# Patient Record
Sex: Male | Born: 1960 | Race: White | Hispanic: No | Marital: Single | Smoking: Former smoker
Health system: Southern US, Community
[De-identification: ages and names within clinical notes are randomized; demographics above are authoritative.]

## PROBLEM LIST (undated history)

## (undated) DIAGNOSIS — Z9221 Personal history of antineoplastic chemotherapy: Secondary | ICD-10-CM

## (undated) DIAGNOSIS — I209 Angina pectoris, unspecified: Secondary | ICD-10-CM

## (undated) DIAGNOSIS — I499 Cardiac arrhythmia, unspecified: Secondary | ICD-10-CM

## (undated) DIAGNOSIS — R911 Solitary pulmonary nodule: Secondary | ICD-10-CM

## (undated) DIAGNOSIS — C189 Malignant neoplasm of colon, unspecified: Secondary | ICD-10-CM

## (undated) DIAGNOSIS — M069 Rheumatoid arthritis, unspecified: Secondary | ICD-10-CM

## (undated) DIAGNOSIS — I341 Nonrheumatic mitral (valve) prolapse: Secondary | ICD-10-CM

## (undated) DIAGNOSIS — K649 Unspecified hemorrhoids: Secondary | ICD-10-CM

## (undated) DIAGNOSIS — R519 Headache, unspecified: Secondary | ICD-10-CM

## (undated) DIAGNOSIS — J449 Chronic obstructive pulmonary disease, unspecified: Secondary | ICD-10-CM

## (undated) HISTORY — DX: Malignant neoplasm of colon, unspecified: C18.9

## (undated) HISTORY — DX: Rheumatoid arthritis, unspecified: M06.9

## (undated) HISTORY — DX: Personal history of antineoplastic chemotherapy: Z92.21

## (undated) HISTORY — PX: COLONOSCOPY: SHX174

## (undated) HISTORY — PX: EYE SURGERY: SHX253

## (undated) HISTORY — DX: Nonrheumatic mitral (valve) prolapse: I34.1

## (undated) HISTORY — PX: COLON SURGERY: SHX602

## (undated) HISTORY — DX: Unspecified hemorrhoids: K64.9

## (undated) HISTORY — DX: Cardiac arrhythmia, unspecified: I49.9

---

## 2008-05-31 ENCOUNTER — Ambulatory Visit: Payer: Self-pay | Admitting: Cardiology

## 2008-05-31 ENCOUNTER — Ambulatory Visit: Payer: Self-pay | Admitting: Surgery

## 2008-06-04 ENCOUNTER — Inpatient Hospital Stay: Payer: Self-pay | Admitting: Surgery

## 2008-06-04 HISTORY — PX: ESOPHAGOGASTRODUODENOSCOPY: SHX1529

## 2008-06-26 ENCOUNTER — Ambulatory Visit: Payer: Self-pay | Admitting: Internal Medicine

## 2008-06-26 HISTORY — PX: PORTACATH PLACEMENT: SHX2246

## 2008-07-09 ENCOUNTER — Ambulatory Visit: Payer: Self-pay | Admitting: Internal Medicine

## 2008-07-17 ENCOUNTER — Ambulatory Visit: Payer: Self-pay | Admitting: Internal Medicine

## 2008-07-24 ENCOUNTER — Ambulatory Visit: Payer: Self-pay | Admitting: Surgery

## 2008-07-27 ENCOUNTER — Ambulatory Visit: Payer: Self-pay | Admitting: Internal Medicine

## 2008-07-27 HISTORY — PX: PORT-A-CATH REMOVAL: SHX5289

## 2008-08-27 ENCOUNTER — Ambulatory Visit: Payer: Self-pay | Admitting: Internal Medicine

## 2008-09-24 ENCOUNTER — Ambulatory Visit: Payer: Self-pay | Admitting: Internal Medicine

## 2008-10-25 ENCOUNTER — Ambulatory Visit: Payer: Self-pay | Admitting: Internal Medicine

## 2008-11-24 ENCOUNTER — Ambulatory Visit: Payer: Self-pay | Admitting: Internal Medicine

## 2008-12-25 ENCOUNTER — Ambulatory Visit: Payer: Self-pay | Admitting: Internal Medicine

## 2009-01-10 ENCOUNTER — Ambulatory Visit: Payer: Self-pay | Admitting: Gastroenterology

## 2009-01-24 ENCOUNTER — Ambulatory Visit: Payer: Self-pay | Admitting: Internal Medicine

## 2009-02-24 ENCOUNTER — Ambulatory Visit: Payer: Self-pay | Admitting: Internal Medicine

## 2009-03-27 ENCOUNTER — Ambulatory Visit: Payer: Self-pay | Admitting: Internal Medicine

## 2009-04-02 ENCOUNTER — Ambulatory Visit: Payer: Self-pay | Admitting: Internal Medicine

## 2009-04-26 ENCOUNTER — Ambulatory Visit: Payer: Self-pay | Admitting: Internal Medicine

## 2009-05-20 ENCOUNTER — Ambulatory Visit: Payer: Self-pay | Admitting: Dermatology

## 2009-05-27 ENCOUNTER — Ambulatory Visit: Payer: Self-pay | Admitting: Internal Medicine

## 2009-06-26 ENCOUNTER — Ambulatory Visit: Payer: Self-pay | Admitting: Internal Medicine

## 2009-07-01 ENCOUNTER — Ambulatory Visit: Payer: Self-pay | Admitting: Internal Medicine

## 2009-07-03 ENCOUNTER — Ambulatory Visit: Payer: Self-pay | Admitting: Unknown Physician Specialty

## 2009-07-27 ENCOUNTER — Ambulatory Visit: Payer: Self-pay | Admitting: Internal Medicine

## 2009-08-29 ENCOUNTER — Ambulatory Visit: Payer: Self-pay | Admitting: Cardiovascular Disease

## 2009-08-29 DIAGNOSIS — I059 Rheumatic mitral valve disease, unspecified: Secondary | ICD-10-CM | POA: Insufficient documentation

## 2009-08-29 DIAGNOSIS — R002 Palpitations: Secondary | ICD-10-CM | POA: Insufficient documentation

## 2009-08-29 DIAGNOSIS — R079 Chest pain, unspecified: Secondary | ICD-10-CM | POA: Insufficient documentation

## 2009-09-05 ENCOUNTER — Ambulatory Visit: Payer: Self-pay | Admitting: Cardiovascular Disease

## 2009-09-05 ENCOUNTER — Encounter: Payer: Self-pay | Admitting: Cardiovascular Disease

## 2009-09-05 ENCOUNTER — Ambulatory Visit: Payer: Self-pay

## 2009-09-16 LAB — CONVERTED CEMR LAB
ALT: 19 units/L (ref 0–53)
AST: 21 units/L (ref 0–37)
Albumin: 4.1 g/dL (ref 3.5–5.2)
Alkaline Phosphatase: 63 units/L (ref 39–117)
BUN: 13 mg/dL (ref 6–23)
CO2: 27 meq/L (ref 19–32)
Calcium: 8.8 mg/dL (ref 8.4–10.5)
Chloride: 102 meq/L (ref 96–112)
Cholesterol: 135 mg/dL (ref 0–200)
Creatinine, Ser: 0.92 mg/dL (ref 0.40–1.50)
Glucose, Bld: 76 mg/dL (ref 70–99)
HDL: 53 mg/dL (ref >39)
LDL Cholesterol: 71 mg/dL (ref 0–99)
Potassium: 4.2 meq/L (ref 3.5–5.3)
Sodium: 139 meq/L (ref 135–145)
TSH: 3.656 microintl units/mL (ref 0.350–4.500)
Total Bilirubin: 1 mg/dL (ref 0.3–1.2)
Total CHOL/HDL Ratio: 2.5
Total Protein: 6.7 g/dL (ref 6.0–8.3)
Triglycerides: 54 mg/dL (ref <150)
VLDL: 11 mg/dL (ref 0–40)

## 2009-09-24 ENCOUNTER — Ambulatory Visit: Payer: Self-pay | Admitting: Internal Medicine

## 2009-09-30 ENCOUNTER — Ambulatory Visit: Payer: Self-pay | Admitting: Internal Medicine

## 2009-10-25 ENCOUNTER — Ambulatory Visit: Payer: Self-pay | Admitting: Internal Medicine

## 2009-11-24 ENCOUNTER — Ambulatory Visit: Payer: Self-pay | Admitting: Internal Medicine

## 2009-12-30 ENCOUNTER — Ambulatory Visit: Payer: Self-pay | Admitting: Internal Medicine

## 2010-01-24 ENCOUNTER — Ambulatory Visit: Payer: Self-pay | Admitting: Internal Medicine

## 2010-02-24 ENCOUNTER — Ambulatory Visit: Payer: Self-pay | Admitting: Internal Medicine

## 2010-03-27 ENCOUNTER — Ambulatory Visit: Payer: Self-pay | Admitting: Internal Medicine

## 2010-04-07 ENCOUNTER — Ambulatory Visit: Payer: Self-pay | Admitting: Internal Medicine

## 2010-04-09 LAB — CEA: CEA: 1.3 ng/mL (ref 0.0–4.7)

## 2010-04-26 ENCOUNTER — Ambulatory Visit: Payer: Self-pay | Admitting: Internal Medicine

## 2010-05-27 ENCOUNTER — Ambulatory Visit: Payer: Self-pay | Admitting: Internal Medicine

## 2010-06-30 ENCOUNTER — Ambulatory Visit: Payer: Self-pay | Admitting: Internal Medicine

## 2010-07-24 ENCOUNTER — Ambulatory Visit: Payer: Self-pay | Admitting: Unknown Physician Specialty

## 2010-07-25 LAB — PATHOLOGY REPORT

## 2010-07-27 ENCOUNTER — Ambulatory Visit: Payer: Self-pay | Admitting: Internal Medicine

## 2010-08-13 LAB — CEA: CEA: 1.1 ng/mL (ref 0.0–4.7)

## 2010-08-26 NOTE — Assessment & Plan Note (Signed)
Summary: np6   Visit Type:  New Patient Referring Provider:  self ref Primary Provider:  Joylene Draft, MD  CC:  chest pains, irregular heart beat, no sob, and no edema.  History of Present Illness: Benjamin Clarke is a very pleasant 50 year old male with a history of colon cancer, status post resection of 9 inches of his colon 14 months ago, history of chemotherapy over course of 32 weeks, 12 cycles, noted to have one positive node who presents to the cardiology clinic 2 stent which care with a history of recent episodes of chest discomfort and palpitations.  Benjamin Clarke states that over the past several weeks to months he's had episodes of chest pain. Initial episode was in August 2010 when he had chest pain near his catheter site or his chemotherapy was injected. Since then he has had chest pain on the right side, down his arms bilaterally at various times. Usually happens at rest and not with exertion as he is able to exercise on a treadmill on a bike with no problems. He denies any significant shortness of breath. He describes the pain as fleeting in nature sometimes lasting for several seconds, sometimes up to a minute or 2. He has also had an episode of dizziness when driving his car from [place] to [place] area. This lasted approximately 20 minutes and resolved without any intervention. He states that he does have a history of mitral valve prolapse this was diagnosed many years ago he has not had any further workup.  Preventive Screening-Counseling & Management  Alcohol-Tobacco     Alcohol drinks/day: <1     Smoking Status: quit     Year Quit: 2008  Caffeine-Diet-Exercise     Caffeine use/day: 2-3 cups     Does Patient Exercise: yes     Type of exercise: walk      Drug Use:  yes.    Current Problems (verified): 1)  Mitral Valve Prolapse  (ICD-424.0)  Current Medications (verified): 1)  Daily Multi  Tabs (Multiple Vitamins-Minerals) .Marland Kitchen.. 1 By Mouth Once Daily 2)  Fish Oil  1000 Mg Caps (Omega-3 Fatty Acids) .... 2 By Mouth Once Daily 3)  Vitamin D 1000 Unit Tabs (Cholecalciferol) .Marland Kitchen.. 1 By Mouth Every Other Day 4)  Aspirin 81 Mg Tbec (Aspirin) .... Take One Tablet By Mouth Daily Maybe Not Everyday  Allergies (verified): No Known Drug Allergies  Past History:  Past Medical History: hx colon cancer mitral valve prolapse  Past Surgical History: colon tumor removed  Family History: Father: deceased 58: lung and heart problems, murmur Mother: living 43: healthy 1st cousin heart and valve problems (paternal) 2nd cousin passed from a heart problem  Social History: Retired  Single  Tobacco Use - Former.  Alcohol Use - yes 1 a day Regular Exercise - yes Drug Use - yes - 1989 last usedAlcohol drinks/day:  <1 Smoking Status:  quit Caffeine use/day:  2-3 cups Does Patient Exercise:  yes Drug Use:  yes  Review of Systems  The patient denies anorexia, fever, weight loss, weight gain, vision loss, decreased hearing, hoarseness, chest pain, syncope, dyspnea on exertion, peripheral edema, prolonged cough, headaches, hemoptysis, abdominal pain, melena, hematochezia, severe indigestion/heartburn, hematuria, incontinence, genital sores, muscle weakness, suspicious skin lesions, transient blindness, difficulty walking, depression, unusual weight change, abnormal bleeding, enlarged lymph nodes, angioedema, breast masses, and testicular masses.         + palitations,   Vital Signs:  Patient profile:   50 year old male Height:  73 inches Weight:      177 pounds BMI:     23.44 Pulse rate:   54 / minute Pulse rhythm:   regular BP sitting:   104 / 66  (left arm) Cuff size:   regular  Vitals Entered By: Mercer Pod (August 29, 2009 9:46 AM)  Physical Exam  General:  thin gentleman in no apparent distress. No shortness of breath. His HEENT exam is benign, oropharynx is clear, neck is supple with no JVP or carotid bruits. Heart sounds are regular  with normal S1 and S2 and a 1-2/6 systolic ejection murmur heard at the left sternal border to the axilla. Lungs are clear to auscultation with no wheezes or rales, abdominal exam is benign with no widened aortic pulsation, no significant lower extremity edema, neurological exam is grossly nonfocal, skin is warm and dry, pulses are equal and simethicone in his upper and lower extremities.   New Orders:     1)  Echocardiogram (Echo)   Impression & Recommendations:  Problem # 1:  PALPITATIONS (ICD-785.1) Benjamin Clarke has had periods of palpitations but he states that these are rare, brief period I suspect that he has some ectopy, either APCs her VPCs. In the setting of normal LV systolic function, this should not be a large concern. Given his recent episodes of chest pain and palpitations, we have ordered a echocardiogram. This will also evaluate his  mitral valve prolapse. His updated medication list for this problem includes:    Aspirin 81 Mg Tbec (Aspirin) .Marland Kitchen... Take one tablet by mouth daily maybe not everyday  Orders: Echocardiogram (Echo)  Problem # 2:  PERSONAL HISTORY OF ANTINEOPLASTIC CHEMOTHERAPY (ICD-V87.41) history of colon cancer and chemotherapy as above. He is uncertain if his pulse cycles of chemotherapy has affected his cardiac function. There is nothing on clinical exam to suggest heart failure though the echocardiogram will confirm normal cardiac function. Orders: Echocardiogram (Echo)  Problem # 3:  CHEST PAIN-UNSPECIFIED (ICD-786.50) the episodes of chest pain but Benjamin Clarke has had over the past 6 months is less concerning given that it is atypical in nature, brief, associated with rest and not with exertion. He does have a reasonable exercise tolerance. He does have risk factors including smoking but he stopped 2 years ago. We will try to obtain a recent lipid panel and have given him a lab form this we sent back for our review. We have suggested that he start an aspirin  81 mg daily. His updated medication list for this problem includes:    Aspirin 81 Mg Tbec (Aspirin) .Marland Kitchen... Take one tablet by mouth daily maybe not everyday  Patient Instructions: 1)  Your physician recommends that you schedule a follow-up appointment in: 1 YR 2)  Your physician recommends that you return for a FASTING lipid profile: at echo appt. (lipids, cmet, tsh) 3)  Your physician recommends that you continue on your current medications as directed. Please refer to the Current Medication list given to you today. 4)  Your physician has requested that you have an echocardiogram.  Echocardiography is a painless test that uses sound waves to create images of your heart. It provides your doctor with information about the size and shape of your heart and how well your heart's chambers and valves are working.  This procedure takes approximately one hour. There are no restrictions for this procedure.

## 2010-08-26 NOTE — Progress Notes (Signed)
Summary: PHI  PHI   Imported By: Harlon Flor 08/30/2009 09:54:26  _____________________________________________________________________  External Attachment:    Type:   Image     Comment:   External Document

## 2010-08-27 ENCOUNTER — Ambulatory Visit: Payer: Self-pay | Admitting: Internal Medicine

## 2010-09-08 ENCOUNTER — Ambulatory Visit: Payer: Self-pay | Admitting: Surgery

## 2011-02-11 ENCOUNTER — Encounter: Payer: Self-pay | Admitting: Cardiovascular Disease

## 2011-03-02 ENCOUNTER — Ambulatory Visit: Payer: Self-pay | Admitting: Internal Medicine

## 2011-03-03 LAB — CEA: CEA: 1.5 ng/mL (ref 0.0–4.7)

## 2011-03-28 ENCOUNTER — Ambulatory Visit: Payer: Self-pay | Admitting: Internal Medicine

## 2011-04-08 ENCOUNTER — Ambulatory Visit: Payer: Self-pay | Admitting: Internal Medicine

## 2011-04-27 ENCOUNTER — Ambulatory Visit: Payer: Self-pay | Admitting: Internal Medicine

## 2011-06-25 ENCOUNTER — Ambulatory Visit: Payer: Self-pay | Admitting: Internal Medicine

## 2011-11-12 ENCOUNTER — Ambulatory Visit: Payer: Self-pay | Admitting: Internal Medicine

## 2011-11-12 LAB — COMPREHENSIVE METABOLIC PANEL
Albumin: 3.8 g/dL (ref 3.4–5.0)
Alkaline Phosphatase: 76 U/L (ref 50–136)
Anion Gap: 7 (ref 7–16)
BUN: 13 mg/dL (ref 7–18)
Bilirubin,Total: 0.7 mg/dL (ref 0.2–1.0)
Calcium, Total: 9.6 mg/dL (ref 8.5–10.1)
Chloride: 98 mmol/L (ref 98–107)
Co2: 30 mmol/L (ref 21–32)
Creatinine: 1.06 mg/dL (ref 0.60–1.30)
EGFR (African American): >60
EGFR (Non-African Amer.): >60
Glucose: 97 mg/dL (ref 65–99)
Osmolality: 270 (ref 275–301)
Potassium: 4 mmol/L (ref 3.5–5.1)
SGOT(AST): 18 U/L (ref 15–37)
SGPT (ALT): 19 U/L
Sodium: 135 mmol/L — ABNORMAL LOW (ref 136–145)
Total Protein: 8.3 g/dL — ABNORMAL HIGH (ref 6.4–8.2)

## 2011-11-12 LAB — CBC CANCER CENTER
Basophil #: 0 x10 3/mm (ref 0.0–0.1)
Basophil %: 0.4 %
Eosinophil #: 0.2 x10 3/mm (ref 0.0–0.7)
Eosinophil %: 2.7 %
HCT: 45.2 % (ref 40.0–52.0)
HGB: 15.4 g/dL (ref 13.0–18.0)
Lymphocyte #: 1.7 x10 3/mm (ref 1.0–3.6)
Lymphocyte %: 20.8 %
MCH: 30.3 pg (ref 26.0–34.0)
MCHC: 34.1 g/dL (ref 32.0–36.0)
MCV: 89 fL (ref 80–100)
Monocyte #: 0.7 x10 3/mm (ref 0.2–1.0)
Monocyte %: 8.8 %
Neutrophil #: 5.7 x10 3/mm (ref 1.4–6.5)
Neutrophil %: 67.3 %
Platelet: 211 x10 3/mm (ref 150–440)
RBC: 5.08 10*6/uL (ref 4.40–5.90)
RDW: 13 % (ref 11.5–14.5)
WBC: 8.4 x10 3/mm (ref 3.8–10.6)

## 2011-11-13 LAB — CEA: CEA: 1.3 ng/mL (ref 0.0–4.7)

## 2011-11-25 ENCOUNTER — Ambulatory Visit: Payer: Self-pay | Admitting: Internal Medicine

## 2012-04-20 ENCOUNTER — Ambulatory Visit: Payer: Self-pay | Admitting: Internal Medicine

## 2012-04-20 LAB — HEPATIC FUNCTION PANEL A (ARMC)
Albumin: 3.6 g/dL (ref 3.4–5.0)
Alkaline Phosphatase: 75 U/L (ref 50–136)
Bilirubin, Direct: 0.1 mg/dL (ref 0.00–0.20)
Bilirubin,Total: 0.6 mg/dL (ref 0.2–1.0)
SGOT(AST): 20 U/L (ref 15–37)
SGPT (ALT): 23 U/L (ref 12–78)
Total Protein: 7.8 g/dL (ref 6.4–8.2)

## 2012-04-20 LAB — CBC CANCER CENTER
Basophil #: 0.1 x10 3/mm (ref 0.0–0.1)
Basophil %: 0.7 %
Eosinophil #: 0.3 x10 3/mm (ref 0.0–0.7)
Eosinophil %: 3.2 %
HCT: 45.5 % (ref 40.0–52.0)
HGB: 15.8 g/dL (ref 13.0–18.0)
Lymphocyte #: 1.7 x10 3/mm (ref 1.0–3.6)
Lymphocyte %: 18.6 %
MCH: 30.5 pg (ref 26.0–34.0)
MCHC: 34.7 g/dL (ref 32.0–36.0)
MCV: 88 fL (ref 80–100)
Monocyte #: 0.9 x10 3/mm (ref 0.2–1.0)
Monocyte %: 10.3 %
Neutrophil #: 6 x10 3/mm (ref 1.4–6.5)
Neutrophil %: 67.2 %
Platelet: 219 x10 3/mm (ref 150–440)
RBC: 5.18 10*6/uL (ref 4.40–5.90)
RDW: 13.4 % (ref 11.5–14.5)
WBC: 8.9 x10 3/mm (ref 3.8–10.6)

## 2012-04-20 LAB — CREATININE, SERUM
Creatinine: 0.94 mg/dL (ref 0.60–1.30)
EGFR (African American): >60
EGFR (Non-African Amer.): >60

## 2012-04-21 LAB — CEA: CEA: 1 ng/mL (ref 0.0–4.7)

## 2012-04-26 ENCOUNTER — Ambulatory Visit: Payer: Self-pay | Admitting: Internal Medicine

## 2012-05-27 ENCOUNTER — Ambulatory Visit: Payer: Self-pay | Admitting: Internal Medicine

## 2012-10-27 ENCOUNTER — Ambulatory Visit: Payer: Self-pay | Admitting: Internal Medicine

## 2012-10-27 LAB — CREATININE, SERUM
Creatinine: 1.18 mg/dL (ref 0.60–1.30)
EGFR (African American): >60
EGFR (Non-African Amer.): >60

## 2012-10-28 LAB — CEA: CEA: 1.4 ng/mL (ref 0.0–4.7)

## 2012-11-24 ENCOUNTER — Ambulatory Visit: Payer: Self-pay | Admitting: Internal Medicine

## 2013-05-04 ENCOUNTER — Ambulatory Visit: Payer: Self-pay | Admitting: Internal Medicine

## 2013-05-04 LAB — CREATININE, SERUM
Creatinine: 1.06 mg/dL (ref 0.60–1.30)
EGFR (African American): >60
EGFR (Non-African Amer.): >60

## 2013-05-04 LAB — CBC CANCER CENTER
Basophil #: 0 x10 3/mm (ref 0.0–0.1)
Basophil %: 0.4 %
Eosinophil #: 0.2 x10 3/mm (ref 0.0–0.7)
Eosinophil %: 2.9 %
HCT: 44.8 % (ref 40.0–52.0)
HGB: 15.1 g/dL (ref 13.0–18.0)
Lymphocyte #: 1.3 x10 3/mm (ref 1.0–3.6)
Lymphocyte %: 17 %
MCH: 29.7 pg (ref 26.0–34.0)
MCHC: 33.8 g/dL (ref 32.0–36.0)
MCV: 88 fL (ref 80–100)
Monocyte #: 0.8 x10 3/mm (ref 0.2–1.0)
Monocyte %: 9.6 %
Neutrophil #: 5.5 x10 3/mm (ref 1.4–6.5)
Neutrophil %: 70.1 %
Platelet: 212 x10 3/mm (ref 150–440)
RBC: 5.1 10*6/uL (ref 4.40–5.90)
RDW: 13.2 % (ref 11.5–14.5)
WBC: 7.9 x10 3/mm (ref 3.8–10.6)

## 2013-05-04 LAB — HEPATIC FUNCTION PANEL A (ARMC)
Albumin: 3.3 g/dL — ABNORMAL LOW (ref 3.4–5.0)
Alkaline Phosphatase: 62 U/L (ref 50–136)
Bilirubin, Direct: 0.1 mg/dL (ref 0.00–0.20)
Bilirubin,Total: 0.6 mg/dL (ref 0.2–1.0)
SGOT(AST): 19 U/L (ref 15–37)
SGPT (ALT): 25 U/L (ref 12–78)
Total Protein: 7.3 g/dL (ref 6.4–8.2)

## 2013-05-05 LAB — CEA: CEA: 1 ng/mL (ref 0.0–4.7)

## 2013-05-27 ENCOUNTER — Ambulatory Visit: Payer: Self-pay | Admitting: Internal Medicine

## 2013-06-26 ENCOUNTER — Ambulatory Visit: Payer: Self-pay | Admitting: Internal Medicine

## 2014-04-06 ENCOUNTER — Ambulatory Visit: Payer: Self-pay | Admitting: Internal Medicine

## 2014-05-03 ENCOUNTER — Ambulatory Visit: Payer: Self-pay | Admitting: Internal Medicine

## 2014-05-03 LAB — COMPREHENSIVE METABOLIC PANEL
Albumin: 3.6 g/dL (ref 3.4–5.0)
Alkaline Phosphatase: 52 U/L
Anion Gap: 5 — ABNORMAL LOW (ref 7–16)
BUN: 15 mg/dL (ref 7–18)
Bilirubin,Total: 0.7 mg/dL (ref 0.2–1.0)
Calcium, Total: 9.2 mg/dL (ref 8.5–10.1)
Chloride: 103 mmol/L (ref 98–107)
Co2: 30 mmol/L (ref 21–32)
Creatinine: 1.01 mg/dL (ref 0.60–1.30)
EGFR (African American): >60
EGFR (Non-African Amer.): >60
Glucose: 102 mg/dL — ABNORMAL HIGH (ref 65–99)
Osmolality: 277 (ref 275–301)
Potassium: 3.8 mmol/L (ref 3.5–5.1)
SGOT(AST): 19 U/L (ref 15–37)
SGPT (ALT): 20 U/L
Sodium: 138 mmol/L (ref 136–145)
Total Protein: 7.5 g/dL (ref 6.4–8.2)

## 2014-05-03 LAB — CBC CANCER CENTER
Basophil #: 0.1 x10 3/mm (ref 0.0–0.1)
Basophil %: 1 %
Eosinophil #: 0.2 x10 3/mm (ref 0.0–0.7)
Eosinophil %: 2.6 %
HCT: 44.3 % (ref 40.0–52.0)
HGB: 15.1 g/dL (ref 13.0–18.0)
Lymphocyte #: 1.6 x10 3/mm (ref 1.0–3.6)
Lymphocyte %: 19.6 %
MCH: 30.3 pg (ref 26.0–34.0)
MCHC: 34 g/dL (ref 32.0–36.0)
MCV: 89 fL (ref 80–100)
Monocyte #: 0.7 x10 3/mm (ref 0.2–1.0)
Monocyte %: 9.3 %
Neutrophil #: 5.3 x10 3/mm (ref 1.4–6.5)
Neutrophil %: 67.5 %
Platelet: 222 x10 3/mm (ref 150–440)
RBC: 4.99 10*6/uL (ref 4.40–5.90)
RDW: 12.8 % (ref 11.5–14.5)
WBC: 7.9 x10 3/mm (ref 3.8–10.6)

## 2014-05-03 LAB — BILIRUBIN, DIRECT: Bilirubin, Direct: 0.1 mg/dL (ref 0.00–0.20)

## 2014-05-04 LAB — CEA: CEA: 1.4 ng/mL (ref 0.0–4.7)

## 2014-05-27 ENCOUNTER — Ambulatory Visit: Payer: Self-pay | Admitting: Internal Medicine

## 2014-11-30 DIAGNOSIS — H4040X0 Glaucoma secondary to eye inflammation, unspecified eye, stage unspecified: Secondary | ICD-10-CM | POA: Insufficient documentation

## 2015-02-06 DIAGNOSIS — H15032 Posterior scleritis, left eye: Secondary | ICD-10-CM | POA: Insufficient documentation

## 2015-02-06 DIAGNOSIS — M069 Rheumatoid arthritis, unspecified: Secondary | ICD-10-CM | POA: Insufficient documentation

## 2015-02-06 DIAGNOSIS — H43823 Vitreomacular adhesion, bilateral: Secondary | ICD-10-CM | POA: Insufficient documentation

## 2015-05-06 ENCOUNTER — Other Ambulatory Visit: Payer: Self-pay | Admitting: *Deleted

## 2015-05-06 ENCOUNTER — Encounter: Payer: Self-pay | Admitting: *Deleted

## 2015-05-06 DIAGNOSIS — C187 Malignant neoplasm of sigmoid colon: Secondary | ICD-10-CM

## 2015-05-07 ENCOUNTER — Inpatient Hospital Stay: Payer: BLUE CROSS/BLUE SHIELD | Attending: Internal Medicine

## 2015-05-07 ENCOUNTER — Ambulatory Visit (HOSPITAL_BASED_OUTPATIENT_CLINIC_OR_DEPARTMENT_OTHER): Payer: BLUE CROSS/BLUE SHIELD | Admitting: Internal Medicine

## 2015-05-07 ENCOUNTER — Encounter: Payer: Self-pay | Admitting: Internal Medicine

## 2015-05-07 VITALS — BP 94/60 | HR 52 | Temp 97.8°F | Resp 18 | Ht 73.0 in | Wt 168.7 lb

## 2015-05-07 DIAGNOSIS — Z9221 Personal history of antineoplastic chemotherapy: Secondary | ICD-10-CM

## 2015-05-07 DIAGNOSIS — Z79899 Other long term (current) drug therapy: Secondary | ICD-10-CM | POA: Insufficient documentation

## 2015-05-07 DIAGNOSIS — M069 Rheumatoid arthritis, unspecified: Secondary | ICD-10-CM

## 2015-05-07 DIAGNOSIS — I341 Nonrheumatic mitral (valve) prolapse: Secondary | ICD-10-CM | POA: Diagnosis not present

## 2015-05-07 DIAGNOSIS — Z87891 Personal history of nicotine dependence: Secondary | ICD-10-CM | POA: Insufficient documentation

## 2015-05-07 DIAGNOSIS — Z85038 Personal history of other malignant neoplasm of large intestine: Secondary | ICD-10-CM | POA: Insufficient documentation

## 2015-05-07 DIAGNOSIS — C187 Malignant neoplasm of sigmoid colon: Secondary | ICD-10-CM

## 2015-05-07 DIAGNOSIS — Z7952 Long term (current) use of systemic steroids: Secondary | ICD-10-CM

## 2015-05-07 DIAGNOSIS — C189 Malignant neoplasm of colon, unspecified: Secondary | ICD-10-CM

## 2015-05-07 LAB — HEPATIC FUNCTION PANEL
ALT: 16 U/L — ABNORMAL LOW (ref 17–63)
AST: 20 U/L (ref 15–41)
Albumin: 4.1 g/dL (ref 3.5–5.0)
Alkaline Phosphatase: 45 U/L (ref 38–126)
Bilirubin, Direct: 0.2 mg/dL (ref 0.1–0.5)
Indirect Bilirubin: 0.6 mg/dL (ref 0.3–0.9)
Total Bilirubin: 0.8 mg/dL (ref 0.3–1.2)
Total Protein: 7 g/dL (ref 6.5–8.1)

## 2015-05-07 LAB — CBC WITH DIFFERENTIAL/PLATELET
Basophils Absolute: 0.1 10*3/uL (ref 0–0.1)
Basophils Relative: 1 %
Eosinophils Absolute: 0.2 10*3/uL (ref 0–0.7)
Eosinophils Relative: 2 %
HCT: 45 % (ref 40.0–52.0)
Hemoglobin: 15.2 g/dL (ref 13.0–18.0)
Lymphocytes Relative: 17 %
Lymphs Abs: 1.6 10*3/uL (ref 1.0–3.6)
MCH: 30.3 pg (ref 26.0–34.0)
MCHC: 33.8 g/dL (ref 32.0–36.0)
MCV: 89.4 fL (ref 80.0–100.0)
Monocytes Absolute: 1 10*3/uL (ref 0.2–1.0)
Monocytes Relative: 10 %
Neutro Abs: 6.9 10*3/uL — ABNORMAL HIGH (ref 1.4–6.5)
Neutrophils Relative %: 70 %
Platelets: 224 10*3/uL (ref 150–440)
RBC: 5.04 MIL/uL (ref 4.40–5.90)
RDW: 13.3 % (ref 11.5–14.5)
WBC: 9.7 10*3/uL (ref 3.8–10.6)

## 2015-05-07 LAB — CREATININE, SERUM
Creatinine, Ser: 0.89 mg/dL (ref 0.61–1.24)
GFR calc Af Amer: >60 mL/min (ref >60)
GFR calc non Af Amer: >60 mL/min (ref >60)

## 2015-05-07 NOTE — Progress Notes (Signed)
[facility] OFFICE PROGRESS NOTE  No care team member to display   SUMMARY OF ONCOLOGIC HISTORY:  # 2009- COLON CA STAGE III [T3N1 s/p Left hemi-colec [[1/13LN]]; MSI- H ? S/p FOLFOX x12 [finished Aug 2010] last colo [dec 2011]  # Uveitis [on pred]/ RA  # Lung nodule [incidental on CT]; last Nov 2014- 20m RML- STABLE [no further fu]   INTERVAL HISTORY:  A very pleasant 54year old male patient with above history of stage III colon cancer is here for follow-up. Patient denies any blood in stools or black stools cough or shortness of breath or chest pain. Appetite is good.  Patient is awaiting a colonoscopy in November 2016.   REVIEW OF SYSTEMS:  A complete 10 point review of system is done which is negative except mentioned above/history of present illness.   PAST MEDICAL HISTORY :  Past Medical History  Diagnosis Date  . Colon cancer ([facility]     sigmoid  . Mitral valve prolapse   . History of chemotherapy     FOLFOX  . Rheumatoid arthritis ([facility]   . Hemorrhoid   . Abnormal heart rhythm     PAST SURGICAL HISTORY :   Past Surgical History  Procedure Laterality Date  . Colon surgery    . Colonoscopy  2013, 05/16/2008  . Esophagogastroduodenoscopy  06/04/08  . Portacath placement  06/2008  . Port-a-cath removal  2010    FAMILY HISTORY :   Family History  Problem Relation Age of Onset  . Heart murmur Father   . Colon cancer Maternal Grandfather   . Arthritis Mother   . Rheumatologic disease Maternal Aunt     SOCIAL HISTORY:   Social History  Substance Use Topics  . Smoking status: Former Smoker -- 1.00 packs/day for 24 years    Types: Cigarettes    Start date: 04/15/2008  . Smokeless tobacco: Never Used  . Alcohol Use: 0.0 oz/week    0 Standard drinks or equivalent per week     Comment: occasional alcohol use    ALLERGIES:  is allergic to thorazine.  MEDICATIONS:  Current Outpatient Prescriptions  Medication Sig Dispense Refill  .  cholecalciferol (VITAMIN D) 1000 UNITS tablet Take 1,000 Units by mouth every other day.      . Difluprednate 0.05 % EMUL Apply 1 drop to eye daily.    . dorzolamide-timolol (COSOPT) 22.3-6.8 MG/ML ophthalmic solution Place 1 drop into the left eye 2 (two) times daily.  5  . Multiple Vitamins-Minerals (DAILY MULTI) TABS Take by mouth daily.      . prednisoLONE acetate (PRED FORTE) 1 % ophthalmic suspension Place 1 drop into the left eye daily. X 14 days  0  . predniSONE (DELTASONE) 10 MG tablet Take 10 mg by mouth daily.  4  . travoprost, benzalkonium, (TRAVATAN) 0.004 % ophthalmic solution Apply 1 drop to eye at bedtime.     No current facility-administered medications for this visit.    PHYSICAL EXAMINATION: ECOG PERFORMANCE STATUS: 0 - Asymptomatic  BP 94/60 mmHg  Pulse 52  Temp(Src) 97.8 F (36.6 C) (Tympanic)  Resp 18  Ht '6\' 1"'  (1.854 m)  Wt 168 lb 10.4 oz (76.499 kg)  BMI 22.26 kg/m2  Filed Weights   05/07/15 1056  Weight: 168 lb 10.4 oz (76.499 kg)    GENERAL: Well-nourished well-developed; Alert, no distress and comfortable.   Alone EYES: no pallor or icterus OROPHARYNX: no thrush or ulceration; good dentition  NECK: supple, no masses felt LYMPH:  no palpable lymphadenopathy in the cervical, axillary or inguinal regions LUNGS: clear to auscultation and  No wheeze or crackles HEART/CVS: regular rate & rhythm and no murmurs; No lower extremity edema ABDOMEN:abdomen soft, non-tender and normal bowel sounds Musculoskeletal:no cyanosis of digits and no clubbing  PSYCH: alert & oriented x 3 with fluent speech NEURO: no focal motor/sensory deficits SKIN:  no rashes or significant lesions  LABORATORY DATA:  I have reviewed the data as listed    Component Value Date/Time   NA 138 05/03/2014 1041   NA 139 09/05/2009 2110   K 3.8 05/03/2014 1041   K 4.2 09/05/2009 2110   CL 103 05/03/2014 1041   CL 102 09/05/2009 2110   CO2 30 05/03/2014 1041   CO2 27 09/05/2009 2110    GLUCOSE 102* 05/03/2014 1041   GLUCOSE 76 09/05/2009 2110   BUN 15 05/03/2014 1041   BUN 13 09/05/2009 2110   CREATININE 0.89 05/07/2015 1019   CREATININE 1.01 05/03/2014 1041   CALCIUM 9.2 05/03/2014 1041   CALCIUM 8.8 09/05/2009 2110   PROT 7.0 05/07/2015 1019   PROT 7.5 05/03/2014 1041   ALBUMIN 4.1 05/07/2015 1019   ALBUMIN 3.6 05/03/2014 1041   AST 20 05/07/2015 1019   AST 19 05/03/2014 1041   ALT 16* 05/07/2015 1019   ALT 20 05/03/2014 1041   ALKPHOS 45 05/07/2015 1019   ALKPHOS 52 05/03/2014 1041   BILITOT 0.8 05/07/2015 1019   BILITOT 0.7 05/03/2014 1041   GFRNONAA >60 05/07/2015 1019   GFRNONAA >60 05/03/2014 1041   GFRNONAA >60 05/04/2013 0952   GFRAA >60 05/07/2015 1019   GFRAA >60 05/03/2014 1041   GFRAA >60 05/04/2013 0952    No results found for: SPEP, UPEP  Lab Results  Component Value Date   WBC 9.7 05/07/2015   NEUTROABS 6.9* 05/07/2015   HGB 15.2 05/07/2015   HCT 45.0 05/07/2015   MCV 89.4 05/07/2015   PLT 224 05/07/2015      Chemistry      Component Value Date/Time   NA 138 05/03/2014 1041   NA 139 09/05/2009 2110   K 3.8 05/03/2014 1041   K 4.2 09/05/2009 2110   CL 103 05/03/2014 1041   CL 102 09/05/2009 2110   CO2 30 05/03/2014 1041   CO2 27 09/05/2009 2110   BUN 15 05/03/2014 1041   BUN 13 09/05/2009 2110   CREATININE 0.89 05/07/2015 1019   CREATININE 1.01 05/03/2014 1041      Component Value Date/Time   CALCIUM 9.2 05/03/2014 1041   CALCIUM 8.8 09/05/2009 2110   ALKPHOS 45 05/07/2015 1019   ALKPHOS 52 05/03/2014 1041   AST 20 05/07/2015 1019   AST 19 05/03/2014 1041   ALT 16* 05/07/2015 1019   ALT 20 05/03/2014 1041   BILITOT 0.8 05/07/2015 1019   BILITOT 0.7 05/03/2014 1041       RADIOGRAPHIC STUDIES: I have personally reviewed the radiological images as listed and agreed with the findings in the report. No results found.   ASSESSMENT & PLAN:  # COLON CA STAGE III [2009] s/p FOLFOX; clinically no evidence of  recurrence noted. CEA from 2015 within normal limits. I agree with colonoscopy surveillance as plan to GI. CEA today pending; if abnormal patient will need imaging.  # Recommend take calcium and vitamin D especially with chronic prednisone intake.   Follow-up in 1 year.    All questions were answered. The patient knows to call the clinic with any problems, questions  or concerns. No barriers to learning was detected. I spent 15 minutes counseling the patient face to face. The total time spent in the appointment was 30 minutes and more than 50% was on counseling and review of test results     Cammie Sickle, MD 05/07/2015 11:21 AM

## 2015-05-08 ENCOUNTER — Telehealth: Payer: Self-pay

## 2015-05-08 LAB — CEA: CEA: 1.5 ng/mL (ref 0.0–4.7)

## 2015-05-08 NOTE — Telephone Encounter (Signed)
Called patient to give him his CEA results.  His CEA was 1.5 and this is in the normal range. Explained the normal range for nonsmokers is <3.9 and for smokers is <5.6. Patient stated understanding and thanked me for calling him with the results.

## 2015-12-06 ENCOUNTER — Telehealth: Payer: Self-pay | Admitting: *Deleted

## 2015-12-06 NOTE — Telephone Encounter (Signed)
OK to take MTX per Dr B. Pt  informed

## 2015-12-06 NOTE — Telephone Encounter (Addendum)
Pt is on yesrly fu for colon cancer and he is asking if it is ok to take Methotrexate ordered by his Rheumatology doctor. Please advise. His next fu is in Oct 2017

## 2016-05-05 ENCOUNTER — Inpatient Hospital Stay: Payer: BLUE CROSS/BLUE SHIELD | Attending: Internal Medicine

## 2016-05-05 ENCOUNTER — Encounter: Payer: Self-pay | Admitting: Internal Medicine

## 2016-05-05 ENCOUNTER — Inpatient Hospital Stay (HOSPITAL_BASED_OUTPATIENT_CLINIC_OR_DEPARTMENT_OTHER): Payer: BLUE CROSS/BLUE SHIELD | Admitting: Internal Medicine

## 2016-05-05 DIAGNOSIS — Z85038 Personal history of other malignant neoplasm of large intestine: Secondary | ICD-10-CM | POA: Insufficient documentation

## 2016-05-05 DIAGNOSIS — I499 Cardiac arrhythmia, unspecified: Secondary | ICD-10-CM | POA: Insufficient documentation

## 2016-05-05 DIAGNOSIS — R918 Other nonspecific abnormal finding of lung field: Secondary | ICD-10-CM

## 2016-05-05 DIAGNOSIS — Z87891 Personal history of nicotine dependence: Secondary | ICD-10-CM

## 2016-05-05 DIAGNOSIS — Z9221 Personal history of antineoplastic chemotherapy: Secondary | ICD-10-CM

## 2016-05-05 DIAGNOSIS — C186 Malignant neoplasm of descending colon: Secondary | ICD-10-CM | POA: Insufficient documentation

## 2016-05-05 DIAGNOSIS — M069 Rheumatoid arthritis, unspecified: Secondary | ICD-10-CM | POA: Diagnosis not present

## 2016-05-05 DIAGNOSIS — Z79899 Other long term (current) drug therapy: Secondary | ICD-10-CM | POA: Diagnosis not present

## 2016-05-05 DIAGNOSIS — K649 Unspecified hemorrhoids: Secondary | ICD-10-CM

## 2016-05-05 DIAGNOSIS — C189 Malignant neoplasm of colon, unspecified: Secondary | ICD-10-CM

## 2016-05-05 DIAGNOSIS — I341 Nonrheumatic mitral (valve) prolapse: Secondary | ICD-10-CM | POA: Diagnosis not present

## 2016-05-05 LAB — COMPREHENSIVE METABOLIC PANEL
ALT: 17 U/L (ref 17–63)
AST: 18 U/L (ref 15–41)
Albumin: 4.1 g/dL (ref 3.5–5.0)
Alkaline Phosphatase: 44 U/L (ref 38–126)
Anion gap: 8 (ref 5–15)
BUN: 17 mg/dL (ref 6–20)
CO2: 22 mmol/L (ref 22–32)
Calcium: 8.7 mg/dL — ABNORMAL LOW (ref 8.9–10.3)
Chloride: 107 mmol/L (ref 101–111)
Creatinine, Ser: 1.07 mg/dL (ref 0.61–1.24)
GFR calc Af Amer: >60 mL/min (ref >60)
GFR calc non Af Amer: >60 mL/min (ref >60)
Glucose, Bld: 104 mg/dL — ABNORMAL HIGH (ref 65–99)
Potassium: 3.3 mmol/L — ABNORMAL LOW (ref 3.5–5.1)
Sodium: 137 mmol/L (ref 135–145)
Total Bilirubin: 0.6 mg/dL (ref 0.3–1.2)
Total Protein: 6.9 g/dL (ref 6.5–8.1)

## 2016-05-05 LAB — CBC WITH DIFFERENTIAL/PLATELET
Basophils Absolute: 0.1 10*3/uL (ref 0–0.1)
Basophils Relative: 1 %
Eosinophils Absolute: 0.2 10*3/uL (ref 0–0.7)
Eosinophils Relative: 2 %
HCT: 45.2 % (ref 40.0–52.0)
Hemoglobin: 15.4 g/dL (ref 13.0–18.0)
Lymphocytes Relative: 24 %
Lymphs Abs: 2.1 10*3/uL (ref 1.0–3.6)
MCH: 31.5 pg (ref 26.0–34.0)
MCHC: 34 g/dL (ref 32.0–36.0)
MCV: 92.6 fL (ref 80.0–100.0)
Monocytes Absolute: 1 10*3/uL (ref 0.2–1.0)
Monocytes Relative: 11 %
Neutro Abs: 5.6 10*3/uL (ref 1.4–6.5)
Neutrophils Relative %: 62 %
Platelets: 227 10*3/uL (ref 150–440)
RBC: 4.88 MIL/uL (ref 4.40–5.90)
RDW: 14 % (ref 11.5–14.5)
WBC: 9.1 10*3/uL (ref 3.8–10.6)

## 2016-05-05 NOTE — Assessment & Plan Note (Addendum)
#   COLON CA STAGE III [2009] s/p FOLFOX; clinically no evidence of recurrence noted. CEA 2017- N.   # take calcium + vit D/ recommend baby asprin/day.   Follow-up in 1 year/labs cbc/cmp/cea.

## 2016-05-05 NOTE — Progress Notes (Signed)
[facility] OFFICE PROGRESS NOTE  No care team member to display   SUMMARY OF ONCOLOGIC HISTORY: Oncology History   # 2009- COLON CA STAGE III [T3N1 s/p Left hemi-colec [[1/13LN]]; MSI- H ? 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 On Mxt [summer 2017]  # Lung nodule [incidental on CT]; last Nov 2014- 66m RML- STABLE [no further fu]     [place] ([facility]   05/07/2015 Initial Diagnosis    Colon carcinoma ([facility])      [facility] [facility] ([facility]   05/05/2016 Initial Diagnosis    [facility] [place] [facility] [place]        INTERVAL HISTORY:  A very pleasant 55year old male patient with above history of stage III colon cancer is here for follow-up.   He was started on methotrexate for uveitis/ rheumatoid arthritis.   Patient denies any blood in stools or black stools cough or shortness of breath or chest pain. Appetite is good.  Patient had colonoscopy in November 2016- 2 polyps taken out.  REVIEW OF SYSTEMS:  A complete 10 point review of system is done which is negative except mentioned above/history of present illness.   PAST MEDICAL HISTORY :  Past Medical History:  Diagnosis Date  . Abnormal heart rhythm   . Colon cancer ([facility]    sigmoid  . Hemorrhoid   . History of chemotherapy    FOLFOX  . Mitral valve prolapse   . Rheumatoid arthritis ([facility]     PAST SURGICAL HISTORY :   Past Surgical History:  Procedure Laterality Date  . COLON SURGERY    . COLONOSCOPY  2013, 05/16/2008  . ESOPHAGOGASTRODUODENOSCOPY  06/04/08  . PORT-A-CATH REMOVAL  2010  . PORTACATH PLACEMENT  06/2008    FAMILY HISTORY :   Family History  Problem Relation Age of Onset  . Arthritis Mother   . Heart murmur Father   . Colon cancer Maternal Grandfather   . Rheumatologic disease Maternal Aunt     SOCIAL HISTORY:   Social History  Substance Use Topics  . Smoking status: Former Smoker    Packs/day: 1.00   Years: 24.00    Types: Cigarettes    Start date: 04/15/2008  . Smokeless tobacco: Never Used  . Alcohol use 0.0 oz/week     Comment: occasional alcohol use    ALLERGIES:  is allergic to thorazine [chlorpromazine].  MEDICATIONS:  Current Outpatient Prescriptions  Medication Sig Dispense Refill  . acetaZOLAMIDE (DIAMOX) 500 MG capsule Take 500 mg by mouth 2 (two) times daily.  3  . brimonidine (ALPHAGAN P) 0.1 % SOLN Apply to eye.    . calcium carbonate (OS-CAL - DOSED IN MG OF ELEMENTAL CALCIUM) 1250 (500 Ca) MG tablet Take by mouth.    . cholecalciferol (VITAMIN D) 1000 UNITS tablet Take 1,000 Units by mouth every other day.      . Difluprednate 0.05 % EMUL Apply 1 drop to eye daily.    . dorzolamide-timolol (COSOPT) 22.3-6.8 MG/ML ophthalmic solution Apply to eye.    . folic acid (FOLVITE) 1 MG tablet TAKE 1 TABLET (1 MG TOTAL) BY MOUTH ONCE DAILY.  11  . latanoprost (XALATAN) 0.005 % ophthalmic solution INSTILL ONE DROP INTO THE LEFT EYE EACH NIGHT AT BEDTIME  5  . methotrexate (RHEUMATREX) 2.5 MG tablet TAKE 8 TABLETS (20 MG TOTAL) BY MOUTH EVERY 7 (SEVEN) DAYS. 8 PILLS ONCE A WEEK WITH A MEAL  3  .  Multiple Vitamins-Minerals (DAILY MULTI) TABS Take by mouth daily.      . prednisoLONE acetate (PRED FORTE) 1 % ophthalmic suspension Place 1 drop into the left eye daily. X 14 days  0  . predniSONE (DELTASONE) 1 MG tablet 25m a day x 2 weeks, 825ma day x 2 weeks, 51m15m day x 2 weeks, 6mg42mday x 2 weeks, 5mg 6may and stay    . predniSONE (DELTASONE) 10 MG tablet Take 10 mg by mouth daily.  4  . travoprost, benzalkonium, (TRAVATAN) 0.004 % ophthalmic solution Apply 1 drop to eye at bedtime.     No current facility-administered medications for this visit.     PHYSICAL EXAMINATION: ECOG PERFORMANCE STATUS: 0 - Asymptomatic  BP 103/65 (BP Location: Right Arm, Patient Position: Sitting)   Pulse (!) 57   Temp 98.3 F (36.8 C)   Resp 19   Ht _0  (1.854 m)   Wt 161 lb 6 oz (73.2 kg)    BMI 21.29 kg/m   Filed Weights   05/05/16 1044  Weight: 161 lb 6 oz (73.2 kg)    GENERAL: Well-nourished well-developed; Alert, no distress and comfortable.   Alone EYES: no pallor or icterus OROPHARYNX: no thrush or ulceration; good dentition  NECK: supple, no masses felt LYMPH:  no palpable lymphadenopathy in the cervical, axillary or inguinal regions LUNGS: clear to auscultation and  No wheeze or crackles HEART/CVS: regular rate & rhythm and no murmurs; No lower extremity edema ABDOMEN:abdomen soft, non-tender and normal bowel sounds Musculoskeletal:no cyanosis of digits and no clubbing  PSYCH: alert & oriented x 3 with fluent speech NEURO: no focal motor/sensory deficits SKIN:  no rashes or significant lesions  LABORATORY DATA:  I have reviewed the data as listed    Component Value Date/Time   NA 137 05/05/2016 1007   NA 138 05/03/2014 1041   K 3.3 (L) 05/05/2016 1007   K 3.8 05/03/2014 1041   CL 107 05/05/2016 1007   CL 103 05/03/2014 1041   CO2 22 05/05/2016 1007   CO2 30 05/03/2014 1041   GLUCOSE 104 (H) 05/05/2016 1007   GLUCOSE 102 (H) 05/03/2014 1041   BUN 17 05/05/2016 1007   BUN 15 05/03/2014 1041   CREATININE 1.07 05/05/2016 1007   CREATININE 1.01 05/03/2014 1041   CALCIUM 8.7 (L) 05/05/2016 1007   CALCIUM 9.2 05/03/2014 1041   PROT 6.9 05/05/2016 1007   PROT 7.5 05/03/2014 1041   ALBUMIN 4.1 05/05/2016 1007   ALBUMIN 3.6 05/03/2014 1041   AST 18 05/05/2016 1007   AST 19 05/03/2014 1041   ALT 17 05/05/2016 1007   ALT 20 05/03/2014 1041   ALKPHOS 44 05/05/2016 1007   ALKPHOS 52 05/03/2014 1041   BILITOT 0.6 05/05/2016 1007   BILITOT 0.7 05/03/2014 1041   GFRNONAA >60 05/05/2016 1007   GFRNONAA >60 05/03/2014 1041   GFRNONAA >60 05/04/2013 0952   GFRAA >60 05/05/2016 1007   GFRAA >60 05/03/2014 1041   GFRAA >60 05/04/2013 0952    No results found for: SPEP, UPEP  Lab Results  Component Value Date   WBC 9.1 05/05/2016   NEUTROABS 5.6  05/05/2016   HGB 15.4 05/05/2016   HCT 45.2 05/05/2016   MCV 92.6 05/05/2016   PLT 227 05/05/2016      Chemistry      Component Value Date/Time   NA 137 05/05/2016 1007   NA 138 05/03/2014 1041   K 3.3 (L) 05/05/2016 1007  K 3.8 05/03/2014 1041   CL 107 05/05/2016 1007   CL 103 05/03/2014 1041   CO2 22 05/05/2016 1007   CO2 30 05/03/2014 1041   BUN 17 05/05/2016 1007   BUN 15 05/03/2014 1041   CREATININE 1.07 05/05/2016 1007   CREATININE 1.01 05/03/2014 1041      Component Value Date/Time   CALCIUM 8.7 (L) 05/05/2016 1007   CALCIUM 9.2 05/03/2014 1041   ALKPHOS 44 05/05/2016 1007   ALKPHOS 52 05/03/2014 1041   AST 18 05/05/2016 1007   AST 19 05/03/2014 1041   ALT 17 05/05/2016 1007   ALT 20 05/03/2014 1041   BILITOT 0.6 05/05/2016 1007   BILITOT 0.7 05/03/2014 1041       RADIOGRAPHIC STUDIES: I have personally reviewed the radiological images as listed and agreed with the findings in the report. No results found.   ASSESSMENT & PLAN:  Cancer of descending colon ([place])  # COLON CA STAGE III [2009] s/p FOLFOX; clinically no evidence of recurrence noted. CEA 2017- N.   # take calcium + vit D/ recommend baby asprin/day.   Follow-up in 1 year/labs cbc/cmp/cea.     Cammie Sickle, MD 05/05/2016 12:19 PM

## 2016-05-05 NOTE — Progress Notes (Signed)
Pt reports no major concerns.  Last week had some nausea but unsure if related to methotrexate

## 2016-05-06 LAB — CEA: CEA: 1.9 ng/mL (ref 0.0–4.7)

## 2016-06-03 LAB — HM HEPATITIS C SCREENING LAB: HM Hepatitis Screen: NEGATIVE

## 2016-08-05 ENCOUNTER — Telehealth: Payer: Self-pay | Admitting: Cardiovascular Disease

## 2016-08-05 NOTE — Telephone Encounter (Signed)
Symptoms sound somewhat atypical Sounds like median or radial nerve issue Would chat with primary care physician Happy to see him for further evaluation if needed

## 2016-08-05 NOTE — Telephone Encounter (Signed)
Spoke with patient and he reports having pain that starts in his ring and middle finger then radiates up right arm into chest. He states that this happens multiple times lasting only a few seconds. He denies any shortness of breath, difficulty breathing, or chest pain on the left side, or left arm. Let him know that this was not typical cardiac presentation but that it could be something else which should be assessed by possibly his primary care physician. Instructed him to call back if he has any chest pain or shortness of breath and if it does not go away then report to emergency room. He is scheduled to come in and see Dr. Rockey Situ on 08/18/16 at 3:20PM and let him know that I would share this with Dr. Rockey Situ as well to see if he has any further recommendations. He verbalized understanding of our conversation and had no further questions at this time.

## 2016-08-05 NOTE — Telephone Encounter (Signed)
Pt  stating he's seen Dr Rockey Situ in past He is calling stating somtimes he has a shooting pain in hand up right arm into chest Happend a few times This has been going on for a few months. Just started to pick up lately  Pt is coming to see Dr Rockey Situ on 08/18/16 3:20 pm  Please advise

## 2016-08-05 NOTE — Telephone Encounter (Signed)
Reviewed recommendations with patient to all his primary care physician and confirmed his upcoming appointment. He verbalized understanding of instructions to contact them and he had no further questions at this time.

## 2016-08-18 ENCOUNTER — Ambulatory Visit: Payer: BLUE CROSS/BLUE SHIELD | Admitting: Cardiovascular Disease

## 2016-09-09 DIAGNOSIS — H2513 Age-related nuclear cataract, bilateral: Secondary | ICD-10-CM | POA: Insufficient documentation

## 2016-10-07 ENCOUNTER — Encounter: Payer: Self-pay | Admitting: *Deleted

## 2016-10-08 NOTE — Discharge Instructions (Signed)

## 2016-10-12 ENCOUNTER — Ambulatory Visit: Payer: BLUE CROSS/BLUE SHIELD | Admitting: Anesthesiology

## 2016-10-12 ENCOUNTER — Ambulatory Visit
Admission: RE | Admit: 2016-10-12 | Discharge: 2016-10-12 | Disposition: A | Discharge status: Home / Self Care | Payer: BLUE CROSS/BLUE SHIELD | Source: Ambulatory Visit | Attending: Ophthalmology | Admitting: Ophthalmology

## 2016-10-12 ENCOUNTER — Encounter
Admission: RE | Disposition: A | Discharge status: Home / Self Care | Payer: Self-pay | Source: Ambulatory Visit | Attending: Ophthalmology

## 2016-10-12 DIAGNOSIS — H10402 Unspecified chronic conjunctivitis, left eye: Secondary | ICD-10-CM | POA: Diagnosis not present

## 2016-10-12 DIAGNOSIS — Z7952 Long term (current) use of systemic steroids: Secondary | ICD-10-CM | POA: Diagnosis not present

## 2016-10-12 DIAGNOSIS — Z87891 Personal history of nicotine dependence: Secondary | ICD-10-CM | POA: Insufficient documentation

## 2016-10-12 DIAGNOSIS — Z85038 Personal history of other malignant neoplasm of large intestine: Secondary | ICD-10-CM | POA: Diagnosis not present

## 2016-10-12 DIAGNOSIS — H4042X3 Glaucoma secondary to eye inflammation, left eye, severe stage: Secondary | ICD-10-CM | POA: Insufficient documentation

## 2016-10-12 DIAGNOSIS — M069 Rheumatoid arthritis, unspecified: Secondary | ICD-10-CM | POA: Insufficient documentation

## 2016-10-12 DIAGNOSIS — J449 Chronic obstructive pulmonary disease, unspecified: Secondary | ICD-10-CM | POA: Diagnosis not present

## 2016-10-12 DIAGNOSIS — H4089 Other specified glaucoma: Secondary | ICD-10-CM | POA: Diagnosis present

## 2016-10-12 DIAGNOSIS — Z79899 Other long term (current) drug therapy: Secondary | ICD-10-CM | POA: Diagnosis not present

## 2016-10-12 HISTORY — PX: AQUEOUS SHUNT: SHX6305

## 2016-10-12 HISTORY — PX: BIOPSY: SHX5522

## 2016-10-12 SURGERY — INSERTION, AQUEOUS SHUNT, EYE
Anesthesia: Monitor Anesthesia Care | Site: Eye | Laterality: Left | Wound class: Clean

## 2016-10-12 MED ORDER — FENTANYL CITRATE (PF) 100 MCG/2ML IJ SOLN: 25.0000 ug | INTRAMUSCULAR | Status: DC | PRN

## 2016-10-12 MED ORDER — ACETAMINOPHEN 325 MG PO TABS: 650.0000 mg | ORAL_TABLET | Freq: Four times a day (QID) | ORAL | Status: DC | PRN

## 2016-10-12 MED ORDER — FENTANYL CITRATE (PF) 100 MCG/2ML IJ SOLN
INTRAMUSCULAR | Status: DC | PRN
  Administered 2016-10-12 (×2): 50 ug via INTRAVENOUS

## 2016-10-12 MED ORDER — MOXIFLOXACIN HCL 0.5 % OP SOLN
1.0000 [drp] | OPHTHALMIC | Status: DC | PRN
  Administered 2016-10-12 (×3): 1 [drp] via OPHTHALMIC

## 2016-10-12 MED ORDER — MIDAZOLAM HCL 2 MG/2ML IJ SOLN
INTRAMUSCULAR | Status: DC | PRN
  Administered 2016-10-12: 2 mg via INTRAVENOUS

## 2016-10-12 MED ORDER — LIDOCAINE HCL (PF) 4 % IJ SOLN
INTRAMUSCULAR | Status: DC | PRN
  Administered 2016-10-12: 2 mL via OPHTHALMIC

## 2016-10-12 MED ORDER — NA HYALUR & NA CHOND-NA HYALUR 0.55-0.5 ML IO KIT
PACK | INTRAOCULAR | Status: DC | PRN
  Administered 2016-10-12: .5 mL via OPHTHALMIC

## 2016-10-12 MED ORDER — TETRACAINE HCL 0.5 % OP SOLN
OPHTHALMIC | Status: DC | PRN
  Administered 2016-10-12: 2 [drp] via OPHTHALMIC

## 2016-10-12 MED ORDER — OXYCODONE HCL 5 MG PO TABS: 5.0000 mg | ORAL_TABLET | Freq: Once | ORAL | Status: DC | PRN

## 2016-10-12 MED ORDER — MOXIFLOXACIN HCL 0.5 % OP SOLN
OPHTHALMIC | Status: DC | PRN
  Administered 2016-10-12: 1 [drp] via OPHTHALMIC

## 2016-10-12 MED ORDER — OXYCODONE HCL 5 MG/5ML PO SOLN: 5.0000 mg | Freq: Once | ORAL | Status: DC | PRN

## 2016-10-12 SURGICAL SUPPLY — 38 items
ALLOGRAFT TUTOPLST SCER0.5X1.0 (Tissue) ×1 IMPLANT
APPLICATOR COTTON TIP 3IN (MISCELLANEOUS) ×2 IMPLANT
Ahmed Glaucoma Valve, Model FP7 (Shunt) ×2 IMPLANT
BANDAGE EYE OVAL (MISCELLANEOUS) ×4 IMPLANT
BLADE SCLEROTME MULTI-SIDE (MISCELLANEOUS) IMPLANT
CANNULA ANT/CHMB 27GA (MISCELLANEOUS) ×4 IMPLANT
CORD BIP STRL DISP 12FT (MISCELLANEOUS) ×2 IMPLANT
CUP MEDICINE 2OZ PLAST GRAD ST (MISCELLANEOUS) ×2 IMPLANT
ERASER TAPRD BLUNT STR 20-23GA (MISCELLANEOUS) ×1 IMPLANT
GLOVE BIO SURGEON STRL SZ7 (GLOVE) ×2 IMPLANT
GLOVE SURG LX 6.5 MICRO (GLOVE) ×1
GLOVE SURG LX STRL 6.5 MICRO (GLOVE) ×1 IMPLANT
GOWN STRL REUS W/ TWL LRG LVL3 (GOWN DISPOSABLE) ×2 IMPLANT
GOWN STRL REUS W/TWL LRG LVL3 (GOWN DISPOSABLE) ×2
KNIFE OPTIMUM SIDEPORT 15DEG (MISCELLANEOUS) IMPLANT
KNIFE SIDECUT EYE (MISCELLANEOUS) ×2 IMPLANT
MARKER SKIN DUAL TIP RULER LAB (MISCELLANEOUS) ×2 IMPLANT
NDL SAFETY 22GX1.5 (NEEDLE) ×2 IMPLANT
NEEDLE FILTER BLUNT 18X 1/2SAF (NEEDLE) ×2
NEEDLE FILTER BLUNT 18X1 1/2 (NEEDLE) ×2 IMPLANT
NEEDLE HYPO 30X.5 LL (NEEDLE) IMPLANT
PACK EYE AFTER SURG (MISCELLANEOUS) ×2 IMPLANT
PACK OPTHALMIC (MISCELLANEOUS) ×2 IMPLANT
PROTECTOR LASIK FLAP (MISCELLANEOUS) ×2 IMPLANT
SOL BAL SALT 15ML (MISCELLANEOUS) ×4
SOLUTION BAL SALT 15ML (MISCELLANEOUS) ×2 IMPLANT
SPONGE SURG I SPEAR (MISCELLANEOUS) ×6 IMPLANT
SUT ETHILON 10-0 CS-B-6CS-B-6 (SUTURE)
SUT ETHILON 8 0 TG100 8 (SUTURE) ×2 IMPLANT
SUT VICRYL 7 0 TG140 8 (SUTURE) ×2 IMPLANT
SUT VICRYL 8 0 BV 130 5 (SUTURE) ×2 IMPLANT
SUTURE EHLN 10-0 CS-B-6CS-B-6 (SUTURE) IMPLANT
SYR 3ML LL SCALE MARK (SYRINGE) ×6 IMPLANT
SYRINGE 10CC LL (SYRINGE) ×2 IMPLANT
TAPERED BLUNT TIP STR 20-23GA (MISCELLANEOUS) ×2
TUTOPLAST SCIERA 0.5X1.0 (Tissue) ×2 IMPLANT
WATER STERILE IRR 250ML POUR (IV SOLUTION) ×2 IMPLANT
WIPE NON LINTING 3.25X3.25 (MISCELLANEOUS) ×2 IMPLANT

## 2016-10-12 NOTE — H&P (Signed)
H+P reviewed and is up to date, please see paper chart.  

## 2016-10-12 NOTE — Anesthesia Preprocedure Evaluation (Signed)
Anesthesia Evaluation  Patient identified by MRN, date of birth, ID band  Reviewed: Allergy & Precautions, NPO status , Patient's Chart, lab work & pertinent test results  Airway Mallampati: I  TM Distance: >3 FB Neck ROM: Full    Dental no notable dental hx.    Pulmonary former smoker,    Pulmonary exam normal        Cardiovascular Normal cardiovascular exam     Neuro/Psych negative neurological ROS  negative psych ROS   GI/Hepatic Neg liver ROS, H./O colon cancer   Endo/Other  negative endocrine ROS  Renal/GU negative Renal ROS     Musculoskeletal  (+) Arthritis , Rheumatoid disorders,    Abdominal   Peds  Hematology negative hematology ROS (+)   Anesthesia Other Findings   Reproductive/Obstetrics                             Anesthesia Physical Anesthesia Plan  ASA: II  Anesthesia Plan: MAC   Post-op Pain Management:    Induction: Intravenous  Airway Management Planned:   Additional Equipment:   Intra-op Plan:   Post-operative Plan:   Informed Consent: I have reviewed the patients History and Physical, chart, labs and discussed the procedure including the risks, benefits and alternatives for the proposed anesthesia with the patient or authorized representative who has indicated his/her understanding and acceptance.     Plan Discussed with: CRNA  Anesthesia Plan Comments:         Anesthesia Quick Evaluation

## 2016-10-12 NOTE — Anesthesia Postprocedure Evaluation (Signed)
Anesthesia Post Note  Patient: Benjamin Clarke  Procedure(s) Performed: Procedure(s) (LRB): AQUEOUS SHUNT with scleral patch graft  Left (Left) BIOPSY (fresh & permanent specimens sent from same location)- conjunctiva left eye (Left)  Patient location during evaluation: PACU Anesthesia Type: MAC Level of consciousness: awake and alert and oriented Pain management: pain level controlled Vital Signs Assessment: post-procedure vital signs reviewed and stable Respiratory status: spontaneous breathing and nonlabored ventilation Cardiovascular status: stable Postop Assessment: no signs of nausea or vomiting and adequate PO intake Anesthetic complications: no    Estill Batten

## 2016-10-12 NOTE — Transfer of Care (Signed)
Immediate Anesthesia Transfer of Care Note  Patient: Benjamin Clarke  Procedure(s) Performed: Procedure(s) with comments: AQUEOUS SHUNT with scleral patch graft  Left (Left) - tube shunt BIOPSY (fresh & permanent specimens sent from same location)- conjunctiva left eye (Left)  Patient Location: PACU  Anesthesia Type: MAC  Level of Consciousness: awake, alert  and patient cooperative  Airway and Oxygen Therapy: Patient Spontanous Breathing and Patient connected to supplemental oxygen  Post-op Assessment: Post-op Vital signs reviewed, Patient's Cardiovascular Status Stable, Respiratory Function Stable, Patent Airway and No signs of Nausea or vomiting  Post-op Vital Signs: Reviewed and stable  Complications: No apparent anesthesia complications

## 2016-10-12 NOTE — Anesthesia Procedure Notes (Signed)
Procedure Name: MAC Performed by: Mayme Genta Pre-anesthesia Checklist: Patient identified, Emergency Drugs available, Suction available, Timeout performed and Patient being monitored Patient Re-evaluated:Patient Re-evaluated prior to inductionOxygen Delivery Method: Nasal cannula Placement Confirmation: positive ETCO2

## 2016-10-13 ENCOUNTER — Encounter: Payer: Self-pay | Admitting: Ophthalmology

## 2016-10-13 NOTE — Op Note (Signed)
Date of Surgery: 10/13/16  PREOPERATIVE DIAGNOSES: 1. Severe stage uveitic glaucoma, left eye. 2. Conjunctival lesion, left eye.  POSTOPERATIVE DIAGNOSES: 1-2. Same  PROCEDURE PERFORMED: 1. Ahmed drainage device placement, left eye. 2. Coverage of glaucoma drainage device with Tutoplast sclera, left eye. 3. Excisional biopsy of conjunctival lesion, left eye.  SURGEON: Almon Hercules, MD.    ANESTHESIA: Monitored anesthesia care and subconjunctival/subTenon's.  IMPLANTS:   Implant Name Type Inv. Item Serial No. Manufacturer Lot No. LRB No. Used  TUTOPLAST SCIERA 0.5X1.0 - [id number] Tissue TUTOPLAST SCIERA 0.5X1.0 [id number] RIT  Left 1  Ahmed Glaucoma Valve, Model FP7 Shunt   [id number] [employer] [id number] Left 1    COMPLICATIONS: None.  DESCRIPTION OF PROCEDURE: After informed consent was obtained, the patient was brought to the operating room and placed in the supine position.  The patient was then prepped and draped in the usual sterile fashion for intraocular surgery on the right eye.  A wire lid speculum was placed.  A 7-0 vicryl suture was placed through the superotemporal limbal cornea and the eye was rotated to expose the superotemporal quadrant.  Using Westcott scissors, a small incision through conjunctiva and Tenons was made in the superotemporal quadrant approximately 4 mm posterior to the limbus. A block which consisted of 2 mL of 50% of 4% Xylocaine without epinephrine and 50% of 0.75% Marcaine was given at sub-Tenons level. Tenons were then dissected from sclera posteriorly and anteriorly with blunt Westcott dissection. Hemostasis was achieved with cautery.  An Ahmed drainage device, model FP7, was removed from its packaging, inspected, and found to be in good condition.  Balanced salt solution on a cannula was used to irrigate the tube, and free flow was noted above the plate. The implant was placed in the retrobulbar space between the superior and lateral rectus muscles  and two 8-0 nylon sutures were placed through the eyelets of the implant. A 22-gauge needle was used to enter the anterior chamber 3 mm from the superior limbus supero-temporally.  The tube was trimmed to length and placed through the needle tract and set to rest above the iris with the aid of Provisc in the anterior chamber with no corneal touch noted.  The tube was then approximated to the globe using a single 8-0 nylon figure-of- eight suture.  Donor scleral overlay placed over the tube and sewn in place using 1 interrupted 7-0 vicryl suture.  The conjunctival incision was closed with running 8-0 Vicryl sutures.  Provisc was flushed from the anterior chamber.  At the end of the case, the corneal limbal traction suture was removed as was the wire lid speculum.  Attention was then directed towards the inferotemporal conjunctival which was elevated and flesh colored.  A small subconjunctival injection of lidocaine/Marcaine was given in that area and a roughly 82mm wide ellipsoid of conjunctival tissue was excised.  This tissue was cut in half, with half going to pathology for fresh sections and the other half was sent to pathology in a formalin cup.  A running 8-O vicryl suture was used to close the resulting conjunctival defect.  The eye was dressed with an application of atropine ointment, and a Fox shield was placed.  The patient was brought to the recovery area having tolerated the procedure with no complications.

## 2016-10-14 LAB — SURGICAL PATHOLOGY

## 2017-03-24 ENCOUNTER — Telehealth: Payer: Self-pay | Admitting: Internal Medicine

## 2017-03-24 DIAGNOSIS — C8599 Non-Hodgkin lymphoma, unspecified, extranodal and solid organ sites: Secondary | ICD-10-CM

## 2017-03-24 DIAGNOSIS — C186 Malignant neoplasm of descending colon: Secondary | ICD-10-CM

## 2017-03-24 NOTE — Telephone Encounter (Signed)
Procedure performed call from Dr. 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   # Recommend CT of the chest abdomen and pelvis with contrast; also MRI brain with and without contrast ASAP. Orders have been placed. [Reports to be faxed to [facility]- fax- [phone].  # Please have patient follow-up with me labs-CBC CMP and LDH CEA; one to 2 days post above imaging.   # H/B- please inform patient of above recommendations/my discussion with [facility] Dr.

## 2017-03-25 NOTE — Telephone Encounter (Signed)
Patient's mother notified of Dr. Aletha Halim comments - she verbalized understanding.  Message sent to scheduling team to schedule MRI, CT, and follow up lab & MD appts.

## 2017-04-02 ENCOUNTER — Ambulatory Visit
Admission: RE | Admit: 2017-04-02 | Discharge: 2017-04-02 | Disposition: A | Discharge status: Home / Self Care | Payer: BLUE CROSS/BLUE SHIELD | Source: Ambulatory Visit | Attending: Internal Medicine | Admitting: Internal Medicine

## 2017-04-02 ENCOUNTER — Other Ambulatory Visit: Payer: BLUE CROSS/BLUE SHIELD

## 2017-04-02 DIAGNOSIS — C186 Malignant neoplasm of descending colon: Secondary | ICD-10-CM | POA: Insufficient documentation

## 2017-04-02 DIAGNOSIS — C8599 Non-Hodgkin lymphoma, unspecified, extranodal and solid organ sites: Secondary | ICD-10-CM | POA: Diagnosis not present

## 2017-04-02 DIAGNOSIS — N289 Disorder of kidney and ureter, unspecified: Secondary | ICD-10-CM | POA: Diagnosis not present

## 2017-04-02 MED ORDER — IOPAMIDOL (ISOVUE-300) INJECTION 61%
125.0000 mL | Freq: Once | INTRAVENOUS | Status: AC | PRN
  Administered 2017-04-02: 125 mL via INTRAVENOUS

## 2017-04-05 ENCOUNTER — Ambulatory Visit
Admission: RE | Admit: 2017-04-05 | Discharge: 2017-04-05 | Disposition: A | Discharge status: Home / Self Care | Payer: BLUE CROSS/BLUE SHIELD | Source: Ambulatory Visit | Attending: Internal Medicine | Admitting: Internal Medicine

## 2017-04-05 ENCOUNTER — Other Ambulatory Visit: Payer: Self-pay | Admitting: Internal Medicine

## 2017-04-05 DIAGNOSIS — C8599 Non-Hodgkin lymphoma, unspecified, extranodal and solid organ sites: Secondary | ICD-10-CM | POA: Insufficient documentation

## 2017-04-05 DIAGNOSIS — C186 Malignant neoplasm of descending colon: Secondary | ICD-10-CM

## 2017-04-05 MED ORDER — GADOBENATE DIMEGLUMINE 529 MG/ML IV SOLN
15.0000 mL | Freq: Once | INTRAVENOUS | Status: AC | PRN
  Administered 2017-04-05: 15 mL via INTRAVENOUS

## 2017-04-06 ENCOUNTER — Inpatient Hospital Stay: Payer: BLUE CROSS/BLUE SHIELD

## 2017-04-06 ENCOUNTER — Ambulatory Visit: Payer: BLUE CROSS/BLUE SHIELD | Admitting: Internal Medicine

## 2017-04-06 ENCOUNTER — Inpatient Hospital Stay: Payer: BLUE CROSS/BLUE SHIELD | Attending: Internal Medicine | Admitting: Internal Medicine

## 2017-04-06 DIAGNOSIS — Z79899 Other long term (current) drug therapy: Secondary | ICD-10-CM | POA: Diagnosis not present

## 2017-04-06 DIAGNOSIS — H209 Unspecified iridocyclitis: Secondary | ICD-10-CM

## 2017-04-06 DIAGNOSIS — C8599 Non-Hodgkin lymphoma, unspecified, extranodal and solid organ sites: Secondary | ICD-10-CM

## 2017-04-06 DIAGNOSIS — H547 Unspecified visual loss: Secondary | ICD-10-CM

## 2017-04-06 DIAGNOSIS — Z800 Family history of malignant neoplasm of digestive organs: Secondary | ICD-10-CM | POA: Insufficient documentation

## 2017-04-06 DIAGNOSIS — C884 Extranodal marginal zone B-cell lymphoma of mucosa-associated lymphoid tissue [MALT-lymphoma]: Secondary | ICD-10-CM | POA: Insufficient documentation

## 2017-04-06 DIAGNOSIS — Z85038 Personal history of other malignant neoplasm of large intestine: Secondary | ICD-10-CM | POA: Insufficient documentation

## 2017-04-06 DIAGNOSIS — Z87891 Personal history of nicotine dependence: Secondary | ICD-10-CM

## 2017-04-06 DIAGNOSIS — C186 Malignant neoplasm of descending colon: Secondary | ICD-10-CM

## 2017-04-06 DIAGNOSIS — N289 Disorder of kidney and ureter, unspecified: Secondary | ICD-10-CM | POA: Diagnosis not present

## 2017-04-06 DIAGNOSIS — I341 Nonrheumatic mitral (valve) prolapse: Secondary | ICD-10-CM | POA: Diagnosis not present

## 2017-04-06 DIAGNOSIS — M069 Rheumatoid arthritis, unspecified: Secondary | ICD-10-CM | POA: Diagnosis not present

## 2017-04-06 DIAGNOSIS — R911 Solitary pulmonary nodule: Secondary | ICD-10-CM

## 2017-04-06 LAB — COMPREHENSIVE METABOLIC PANEL
ALT: 16 U/L — ABNORMAL LOW (ref 17–63)
AST: 24 U/L (ref 15–41)
Albumin: 4.2 g/dL (ref 3.5–5.0)
Alkaline Phosphatase: 39 U/L (ref 38–126)
Anion gap: 7 (ref 5–15)
BUN: 16 mg/dL (ref 6–20)
CO2: 29 mmol/L (ref 22–32)
Calcium: 9 mg/dL (ref 8.9–10.3)
Chloride: 101 mmol/L (ref 101–111)
Creatinine, Ser: 1.03 mg/dL (ref 0.61–1.24)
GFR calc Af Amer: >60 mL/min (ref >60)
GFR calc non Af Amer: >60 mL/min (ref >60)
Glucose, Bld: 100 mg/dL — ABNORMAL HIGH (ref 65–99)
Potassium: 3.6 mmol/L (ref 3.5–5.1)
Sodium: 137 mmol/L (ref 135–145)
Total Bilirubin: 0.9 mg/dL (ref 0.3–1.2)
Total Protein: 7 g/dL (ref 6.5–8.1)

## 2017-04-06 LAB — CBC WITH DIFFERENTIAL/PLATELET
Basophils Absolute: 0.1 10*3/uL (ref 0–0.1)
Basophils Relative: 1 %
Eosinophils Absolute: 0.3 10*3/uL (ref 0–0.7)
Eosinophils Relative: 4 %
HCT: 42.4 % (ref 40.0–52.0)
Hemoglobin: 14.8 g/dL (ref 13.0–18.0)
Lymphocytes Relative: 27 %
Lymphs Abs: 2.1 10*3/uL (ref 1.0–3.6)
MCH: 31.8 pg (ref 26.0–34.0)
MCHC: 34.9 g/dL (ref 32.0–36.0)
MCV: 91.2 fL (ref 80.0–100.0)
Monocytes Absolute: 0.7 10*3/uL (ref 0.2–1.0)
Monocytes Relative: 9 %
Neutro Abs: 4.6 10*3/uL (ref 1.4–6.5)
Neutrophils Relative %: 59 %
Platelets: 222 10*3/uL (ref 150–440)
RBC: 4.65 MIL/uL (ref 4.40–5.90)
RDW: 13.5 % (ref 11.5–14.5)
WBC: 7.8 10*3/uL (ref 3.8–10.6)

## 2017-04-06 LAB — LACTATE DEHYDROGENASE: LDH: 100 U/L (ref 98–192)

## 2017-04-06 NOTE — Progress Notes (Signed)
Patient is here today for a follow up. Patient states no new concerns today.  

## 2017-04-06 NOTE — Progress Notes (Signed)
[facility] OFFICE PROGRESS NOTE  Patient Care Team: Idelle Crouch, MD as PCP - General (Internal Medicine)   SUMMARY OF ONCOLOGIC HISTORY: Oncology History   # 2009- COLON CA STAGE III [T3N1 s/p Left hemi-colec [[1/13LN]]; MSI- H ? 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  [facility]- however imaging concerning for lymphoma. He is here to review MRI of the brain.   He was started on methotrexate for uveitis/ rheumatoid arthritis. Patient is also currently on Remicade [started recently].  Unfortunately continues to have poor vision in his left eye. He denies any headaches. Denies any falls. Denies any gait instability. Denies any lumps or bumps anywhere else. No night sweats. Patient denies any blood in stools or black stools cough or shortness of breath or chest pain. Appetite is good.  REVIEW OF SYSTEMS:  A complete 10 point review of system is done which is negative except mentioned above/history of present illness.   PAST MEDICAL HISTORY :  Past Medical History:  Diagnosis Date  . Abnormal heart rhythm   . Colon cancer ([facility]    sigmoid  . Hemorrhoid   . History of chemotherapy    FOLFOX  . Mitral valve prolapse    Pt reports Dr. GRockey Situtold him NO MVP  . Rheumatoid arthritis ([facility]     PAST SURGICAL HISTORY :   Past Surgical History:  Procedure Laterality Date   . AQUEOUS SHUNT Left 10/12/2016   Procedure: AQUEOUS SHUNT with scleral patch graft  Left;  Surgeon: ARonnell Freshwater MD;  [facility]  Service: Ophthalmology;  Laterality: Left;  tube shunt  . BIOPSY Left 10/12/2016   Procedure: BIOPSY (fresh & permanent specimens sent from same location)- conjunctiva left eye;  Surgeon: ARonnell Freshwater MD;  [place] [facility]  Service: Ophthalmology;  Laterality: Left;  . COLON SURGERY    . COLONOSCOPY  2013, 05/16/2008  . ESOPHAGOGASTRODUODENOSCOPY  06/04/08  . PORT-A-CATH REMOVAL  2010  . PORTACATH PLACEMENT  06/2008    FAMILY HISTORY :   Family History  Problem Relation Age of Onset  . Arthritis Mother   . Heart murmur Father   . Colon cancer Maternal Grandfather   . Rheumatologic disease Maternal Aunt     SOCIAL HISTORY:   Social History  Substance Use Topics  . Smoking status: Former Smoker    Packs/day: 1.00    Years: 24.00    Types: Cigarettes    Start date: 04/15/2008    Quit date: 07/27/2008  . Smokeless tobacco: Never Used  . Alcohol use 0.0 oz/week     Comment: occasional alcohol use    ALLERGIES:  is allergic to thorazine [chlorpromazine].  MEDICATIONS:  Current Outpatient Prescriptions  Medication Sig Dispense Refill  . brimonidine (ALPHAGAN P) 0.1 % SOLN Apply  to eye.    . calcium carbonate (OS-CAL - DOSED IN MG OF ELEMENTAL CALCIUM) 1250 (500 Ca) MG tablet Take by mouth.    . cholecalciferol (VITAMIN D) 1000 UNITS tablet Take 1,000 Units by mouth every other day.      . dorzolamide-timolol (COSOPT) 22.3-6.8 MG/ML ophthalmic solution Apply to eye.    . folic acid (FOLVITE) 1 MG tablet TAKE 1 TABLET (1 MG TOTAL) BY MOUTH ONCE DAILY.  11  . latanoprost (XALATAN) 0.005 % ophthalmic solution INSTILL ONE DROP INTO THE LEFT EYE EACH NIGHT AT BEDTIME  5  . methotrexate (RHEUMATREX) 2.5 MG tablet TAKE 8 TABLETS (20 MG TOTAL) BY MOUTH EVERY 7 (SEVEN) DAYS. 8 PILLS ONCE A WEEK WITH A  MEAL  3  . Multiple Vitamins-Minerals (DAILY MULTI) TABS Take by mouth daily.      . prednisoLONE acetate (PRED FORTE) 1 % ophthalmic suspension Place 1 drop into the left eye daily. X 14 days  0  . predniSONE (DELTASONE) 1 MG tablet 53m a day x 2 weeks, 856ma day x 2 weeks, 59m64m day x 2 weeks, 6mg35mday x 2 weeks, 5mg 61may and stay (currently at 5 mg/day)    . acetaZOLAMIDE (DIAMOX) 500 MG capsule Take 500 mg by mouth 2 (two) times daily.  3  . Adalimumab (HUMIRA) 40 MG/0.8ML PSKT Inject into the skin every 14 (fourteen) days.    . omeMarland Kitchenrazole (PRILOSEC) 40 MG capsule Take 40 mg by mouth daily.     No current facility-administered medications for this visit.     PHYSICAL EXAMINATION: ECOG PERFORMANCE STATUS: 0 - Asymptomatic  BP 111/73 (BP Location: Left Arm, Patient Position: Sitting)   Pulse 69   Temp (!) 97.2 F (36.2 C) (Tympanic)   Resp 16   Wt 164 lb 12.7 oz (74.7 kg)   BMI 21.74 kg/m   Filed Weights   04/06/17 0951  Weight: 164 lb 12.7 oz (74.7 kg)    GENERAL: Well-nourished well-developed; Alert, no distress and comfortable.   Alone EYES: no pallor or icterus; positive for erythema and swelling of the left eye. OROPHARYNX: no thrush or ulceration; good dentition  NECK: supple, no masses felt LYMPH:  no palpable lymphadenopathy in the cervical, axillary or inguinal regions LUNGS: clear to auscultation and  No wheeze or crackles HEART/CVS: regular rate & rhythm and no murmurs; No lower extremity edema ABDOMEN:abdomen soft, non-tender and normal bowel sounds Musculoskeletal:no cyanosis of digits and no clubbing  PSYCH: alert & oriented x 3 with fluent speech NEURO: no focal motor/sensory deficits SKIN:  no rashes or significant lesions  LABORATORY DATA:  I have reviewed the data as listed    Component Value Date/Time   NA 137 04/06/2017 0929   NA 138 05/03/2014 1041   K 3.6 04/06/2017 0929   K 3.8 05/03/2014 1041   CL 101 04/06/2017 0929   CL 103 05/03/2014  1041   CO2 29 04/06/2017 0929   CO2 30 05/03/2014 1041   GLUCOSE 100 (H) 04/06/2017 0929   GLUCOSE 102 (H) 05/03/2014 1041   BUN 16 04/06/2017 0929   BUN 15 05/03/2014 1041   CREATININE 1.03 04/06/2017 0929   CREATININE 1.01 05/03/2014 1041   CALCIUM 9.0 04/06/2017 0929   CALCIUM 9.2 05/03/2014 1041   PROT 7.0 04/06/2017 0929   PROT 7.5 05/03/2014 1041   ALBUMIN 4.2 04/06/2017 0929   ALBUMIN 3.6 05/03/2014 1041   AST 24 04/06/2017 0929   AST 19 05/03/2014  1041   ALT 16 (L) 04/06/2017 0929   ALT 20 05/03/2014 1041   ALKPHOS 39 04/06/2017 0929   ALKPHOS 52 05/03/2014 1041   BILITOT 0.9 04/06/2017 0929   BILITOT 0.7 05/03/2014 1041   GFRNONAA >60 04/06/2017 0929   GFRNONAA >60 05/03/2014 1041   GFRNONAA >60 05/04/2013 0952   GFRAA >60 04/06/2017 0929   GFRAA >60 05/03/2014 1041   GFRAA >60 05/04/2013 0952    No results found for: SPEP, UPEP  Lab Results  Component Value Date   WBC 7.8 04/06/2017   NEUTROABS 4.6 04/06/2017   HGB 14.8 04/06/2017   HCT 42.4 04/06/2017   MCV 91.2 04/06/2017   PLT 222 04/06/2017      Chemistry      Component Value Date/Time   NA 137 04/06/2017 0929   NA 138 05/03/2014 1041   K 3.6 04/06/2017 0929   K 3.8 05/03/2014 1041   CL 101 04/06/2017 0929   CL 103 05/03/2014 1041   CO2 29 04/06/2017 0929   CO2 30 05/03/2014 1041   BUN 16 04/06/2017 0929   BUN 15 05/03/2014 1041   CREATININE 1.03 04/06/2017 0929   CREATININE 1.01 05/03/2014 1041      Component Value Date/Time   CALCIUM 9.0 04/06/2017 0929   CALCIUM 9.2 05/03/2014 1041   ALKPHOS 39 04/06/2017 0929   ALKPHOS 52 05/03/2014 1041   AST 24 04/06/2017 0929   AST 19 05/03/2014 1041   ALT 16 (L) 04/06/2017 0929   ALT 20 05/03/2014 1041   BILITOT 0.9 04/06/2017 0929   BILITOT 0.7 05/03/2014 1041       RADIOGRAPHIC STUDIES: I have personally reviewed the radiological images as listed and agreed with the findings in the report. Mr Jeri Cos Wo Contrast  Result Date:  04/05/2017 CLINICAL DATA:  Lymphoma left orbit. Worsening vision. Chemosis left eye. Shunt placed in left eye in March of this year EXAM: MRI HEAD WITHOUT AND WITH CONTRAST TECHNIQUE: Multiplanar, multiecho pulse sequences of the brain and surrounding structures were obtained without and with intravenous contrast. CONTRAST:  15 cc MultiHance COMPARISON:  None. FINDINGS: Brain: The brain itself has normal appearance without evidence of old or acute small or large vessel infarction, mass lesion, hemorrhage, hydrocephalus or extra-axial collection. No abnormal intracranial enhancement occurs. Arterial and venous structures appear patent. This study was not ordered or performed as a dedicated orbital study. The right orbit appears normal. The left lacrimal gland is abnormal, appearing as a nonenhancing cystic region measuring 11 x 17 mm. In all seen Deneise Lever enhancement or surrounding inflammatory change. Left globe is abnormal, showing circumferential thickening of the wall of the orbit as well as a thin brim of tissue intensity material along the posterior wall which appears to show some enhancement. In the setting of previous lymphoma and previous surgery, these findings are difficult to interpret. I cannot rule out the possibility of hemorrhage, inflammatory disease or lymphoma responsible for these findings. No evidence of any abnormality of the optic nerve or extra-ocular muscles. Orbital fat appears otherwise normal. Vascular: Major vessels at the base of the brain show flow. Skull and upper cervical spine: Negative Sinuses/Orbits: Sinuses are clear except for an insignificant retention cyst in the left maxillary sinus. See above for orbit discussion. Other: None significant IMPRESSION: Normal appearance of the brain itself. Abnormal appearance of the left orbit. This study was not ordered or performed as a dedicated orbital study. Cystic appearance of the lacrimal gland region without evidence of  enhancing mass or  inflammation. Thickening and enhancement of the wall of the globe, including some enhancing material posterior to the globe. The appearance is nonspecific and could be due to inflammation, hemorrhage or tumor. See above discussion. Electronically Signed   By: Nelson Chimes M.D.   On: 04/05/2017 11:08    IMPRESSION: 1. Stable exam. No evidence for lymphadenopathy in the chest, abdomen, or pelvis. 2. 15 mm well-defined hypoattenuating lesion in the right kidney is most likely a cyst, only minimally increased in the nearly 4 year interval since prior exam.   Electronically Signed   By: Misty Stanley M.D.   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 CT of the chest abdomen pelvis-negative for malignancy.   # Discussed with the patient that biopsy would be needed for further evaluation/ and recommendations for treatment. He is awaiting evaluation with [facility] oculoplastic department.   # COLON CA STAGE III [2009] s/p FOLFOX; clinically no evidence of recurrence noted. CT chest abdomen pelvis negative for any malignancy [September 2018]. CEA pending today.  # ? Autoimmune- uveitis/scleritis/question rheumatoid arthritis- patient is currently started on infliximab as per rheumatology/Dr.Kernodle. Would continue infliximab at this time  # follow up in 4 weeks or sooner based on above work up at [facility] Patient was given a copy of the report of the imaging/MRI disk. We'll also fax the MRI report/send disk to Dr.Jaffe.   # The above plan of care was discussed with Dr. Mina Marble, [facility]; and also with Dr.Kern[facility]le.     Cammie Sickle, MD 04/06/2017 1:02 PM

## 2017-04-06 NOTE — Assessment & Plan Note (Addendum)
#   Left orbital swelling-September 2018- MRI of the brain- negative for any malignancy involving the leptomeninges/parenchyma; however left orbital- thickening noted inflammation versus lymphoma versus hemorrhage. CT of the chest abdomen pelvis-negative for malignancy.   # Discussed with the patient that biopsy would be needed for further evaluation/ and recommendations for treatment. He is awaiting evaluation with [facility] oculoplastic department.   # COLON CA STAGE III [2009] s/p FOLFOX; clinically no evidence of recurrence noted. CT chest abdomen pelvis negative for any malignancy [September 2018]. CEA pending today.  # ? Autoimmune- uveitis/scleritis/question rheumatoid arthritis- patient is currently started on infliximab as per rheumatology/Dr.Kern[facility]le. Would continue infliximab at this time  # follow up in 4 weeks or sooner based on above work up at [facility] Patient was given a copy of the report of the imaging/MRI disk. We'll also fax the MRI report/send disk to Dr.Jaffe.   # The above plan of care was discussed with Dr. Mina Marble, [facility]; and also with Dr.[facility]ern[facility]le.

## 2017-04-07 LAB — CEA: CEA: 1.8 ng/mL (ref 0.0–4.7)

## 2017-04-20 ENCOUNTER — Other Ambulatory Visit: Payer: Self-pay | Admitting: Internal Medicine

## 2017-04-20 DIAGNOSIS — D487 Neoplasm of uncertain behavior of other specified sites: Secondary | ICD-10-CM

## 2017-04-20 DIAGNOSIS — C8599 Non-Hodgkin lymphoma, unspecified, extranodal and solid organ sites: Secondary | ICD-10-CM

## 2017-04-27 ENCOUNTER — Telehealth: Payer: Self-pay | Admitting: Internal Medicine

## 2017-04-27 ENCOUNTER — Ambulatory Visit
Admission: RE | Admit: 2017-04-27 | Discharge: 2017-04-27 | Disposition: A | Discharge status: Home / Self Care | Payer: BLUE CROSS/BLUE SHIELD | Source: Ambulatory Visit | Attending: Internal Medicine | Admitting: Internal Medicine

## 2017-04-27 DIAGNOSIS — C8599 Non-Hodgkin lymphoma, unspecified, extranodal and solid organ sites: Secondary | ICD-10-CM | POA: Insufficient documentation

## 2017-04-27 DIAGNOSIS — D487 Neoplasm of uncertain behavior of other specified sites: Secondary | ICD-10-CM | POA: Diagnosis not present

## 2017-04-27 MED ORDER — GADOBENATE DIMEGLUMINE 529 MG/ML IV SOLN
15.0000 mL | Freq: Once | INTRAVENOUS | Status: AC | PRN
  Administered 2017-04-27: 15 mL via INTRAVENOUS

## 2017-04-27 NOTE — Telephone Encounter (Signed)
Left message for Dr.jaffe/ Dr.Leyngold- at [facility] to talk re: MRI orbits.   Tried to reach the pt at [phone]. I cannot leave a message.   Heather- Please send a disk of the most recent MRI orbits [oct2nd] to [place] at [place].

## 2017-04-28 ENCOUNTER — Telehealth: Payer: Self-pay | Admitting: Internal Medicine

## 2017-04-28 NOTE — Telephone Encounter (Signed)
Spoke to [facility]; he recommends Biopsy with Dr.Lyngold.   I had yesterday left message for Dr.Lymgold to discuss about this.   Please inform pt that I reached out to the [facility]. Also, inform pt to reach out to [facility] office to set up the biopsy.   He can call us for any questions. Thx

## 2017-04-29 ENCOUNTER — Telehealth: Payer: Self-pay | Admitting: *Deleted

## 2017-04-29 ENCOUNTER — Telehealth: Payer: Self-pay | Admitting: Internal Medicine

## 2017-04-29 NOTE — Telephone Encounter (Signed)
Spoke to Drs. Jaffe/Lynegold- agree with biopsy of left retinal lesion.  Spoke to pt he agrees. He will call and make appt with [place] 1 week aftre the biopsy at [facility].

## 2017-04-29 NOTE — Telephone Encounter (Signed)
-----   Message from Secundino Ginger sent at 04/29/2017 11:33 AM EDT ----- Regarding: appt Contact: [phone] Will you call this pt. He called [place] and was talking about he had a MRI on Monday  And had an appt with [facility] dr. Juluis Pitch talking about a BX and wanted to know if he needed to come to his appt Tuesday? He just needs clarification since he has'nt had BX yet.  [phone]

## 2017-04-29 NOTE — Telephone Encounter (Signed)
-----   Message from Secundino Ginger sent at 04/29/2017 11:33 AM EDT ----- Regarding: appt Contact: [phone] Will you call this pt. He called [place] and was talking about he had a MRI on Monday  And had an appt with [facility] dr. Juluis Pitch talking about a BX and wanted to know if he needed to come to his appt Tuesday? He just needs clarification since he has'nt had BX yet.  [phone]

## 2017-04-29 NOTE — Telephone Encounter (Signed)
Dr. Jacinto Reap, please see phone note below. Thanks!

## 2017-05-04 ENCOUNTER — Other Ambulatory Visit: Payer: BLUE CROSS/BLUE SHIELD

## 2017-05-04 ENCOUNTER — Ambulatory Visit: Payer: BLUE CROSS/BLUE SHIELD | Admitting: Internal Medicine

## 2017-06-15 ENCOUNTER — Telehealth: Payer: Self-pay | Admitting: Internal Medicine

## 2017-06-15 NOTE — Telephone Encounter (Signed)
Returned page for Dr.Zane at [phone]

## 2017-06-15 NOTE — Telephone Encounter (Signed)
I have reached out to [place] at one other phone number that was provided to me; unable to leave any message. ; and also paged to return call back as of yet.  Shirlean Mylar- Please inform pt of my above trial to communicate with doc at [facility]. Thx

## 2017-06-16 ENCOUNTER — Telehealth: Payer: Self-pay | Admitting: Internal Medicine

## 2017-06-16 DIAGNOSIS — C8599 Non-Hodgkin lymphoma, unspecified, extranodal and solid organ sites: Secondary | ICD-10-CM

## 2017-06-16 NOTE — Telephone Encounter (Signed)
Scheduling msg sent to also schedule referral to radiation

## 2017-06-16 NOTE — Telephone Encounter (Signed)
msg sent to scheduling team regarding pet scan/md/lab apts and referral to radiation. Lab orders added to pt's future encounter per md order

## 2017-06-16 NOTE — Addendum Note (Signed)
Addended by: Sabino Gasser on: 06/16/2017 10:59 AM   Modules accepted: Orders

## 2017-06-16 NOTE — Telephone Encounter (Signed)
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 Discussed with Dr.Crystal; plan RT.   # ordered PET scan Asap; follow up with me few days after PET scan/ labs- cbc/cmp/ldh  # referral to Dr.Crystal [Lymphoma:Dx]  GB

## 2017-06-24 ENCOUNTER — Other Ambulatory Visit: Payer: Self-pay | Admitting: Internal Medicine

## 2017-06-24 DIAGNOSIS — C8599 Non-Hodgkin lymphoma, unspecified, extranodal and solid organ sites: Secondary | ICD-10-CM

## 2017-06-25 ENCOUNTER — Encounter: Admission: RE | Admit: 2017-06-25 | Payer: BLUE CROSS/BLUE SHIELD | Source: Ambulatory Visit

## 2017-06-25 ENCOUNTER — Encounter
Admission: RE | Admit: 2017-06-25 | Discharge: 2017-06-25 | Disposition: A | Discharge status: Home / Self Care | Payer: BLUE CROSS/BLUE SHIELD | Source: Ambulatory Visit | Attending: Internal Medicine | Admitting: Internal Medicine

## 2017-06-25 ENCOUNTER — Other Ambulatory Visit: Payer: Self-pay | Admitting: Internal Medicine

## 2017-06-25 DIAGNOSIS — C8599 Non-Hodgkin lymphoma, unspecified, extranodal and solid organ sites: Secondary | ICD-10-CM | POA: Diagnosis present

## 2017-06-25 LAB — GLUCOSE, CAPILLARY: Glucose-Capillary: 99 mg/dL (ref 65–99)

## 2017-06-25 MED ORDER — FLUDEOXYGLUCOSE F - 18 (FDG) INJECTION
12.8200 | Freq: Once | INTRAVENOUS | Status: AC | PRN
  Administered 2017-06-25: 12.82 via INTRAVENOUS

## 2017-06-28 ENCOUNTER — Other Ambulatory Visit: Payer: Self-pay

## 2017-06-28 ENCOUNTER — Ambulatory Visit
Admission: RE | Admit: 2017-06-28 | Discharge: 2017-06-28 | Disposition: A | Discharge status: Home / Self Care | Payer: BLUE CROSS/BLUE SHIELD | Source: Ambulatory Visit | Attending: Radiation Oncology | Admitting: Radiation Oncology

## 2017-06-28 ENCOUNTER — Encounter: Payer: Self-pay | Admitting: Radiation Oncology

## 2017-06-28 VITALS — BP 108/69 | HR 60 | Temp 97.2°F | Resp 18 | Wt 171.0 lb

## 2017-06-28 DIAGNOSIS — Z9221 Personal history of antineoplastic chemotherapy: Secondary | ICD-10-CM | POA: Insufficient documentation

## 2017-06-28 DIAGNOSIS — I341 Nonrheumatic mitral (valve) prolapse: Secondary | ICD-10-CM | POA: Insufficient documentation

## 2017-06-28 DIAGNOSIS — Z79899 Other long term (current) drug therapy: Secondary | ICD-10-CM | POA: Insufficient documentation

## 2017-06-28 DIAGNOSIS — Z87891 Personal history of nicotine dependence: Secondary | ICD-10-CM | POA: Insufficient documentation

## 2017-06-28 DIAGNOSIS — Z800 Family history of malignant neoplasm of digestive organs: Secondary | ICD-10-CM | POA: Diagnosis not present

## 2017-06-28 DIAGNOSIS — I499 Cardiac arrhythmia, unspecified: Secondary | ICD-10-CM | POA: Diagnosis not present

## 2017-06-28 DIAGNOSIS — Z510 Encounter for antineoplastic radiation therapy: Secondary | ICD-10-CM | POA: Diagnosis not present

## 2017-06-28 DIAGNOSIS — C884 Extranodal marginal zone B-cell lymphoma of mucosa-associated lymphoid tissue [MALT-lymphoma]: Secondary | ICD-10-CM | POA: Diagnosis not present

## 2017-06-28 DIAGNOSIS — M069 Rheumatoid arthritis, unspecified: Secondary | ICD-10-CM | POA: Diagnosis not present

## 2017-06-28 DIAGNOSIS — Z85038 Personal history of other malignant neoplasm of large intestine: Secondary | ICD-10-CM | POA: Insufficient documentation

## 2017-06-28 NOTE — Consult Note (Signed)
NEW PATIENT EVALUATION  Name: Benjamin Clarke  MRN: [id number]  Date:   06/28/2017     DOB: Dec 08, 1960   This 56 y.o. 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  REFERRING PHYSICIAN: Idelle Crouch, MD  CHIEF COMPLAINT:  Chief Complaint  Patient presents with  . 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   PREVIOUS INVESTIGATIONS:  MRI scans of the orbits PET CT scans reviewed Pathology report reviewed Clinical notes reviewed  HPI: Patient is a 56 year old male with history of colon cancer stage III (T3 N1 M0) back in 2009 status post left hemicolectomy and adjuvant FOLFOX 12. 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 He back in October had MRI of his orbits with a crescent shaped 1.8 cm retro-bulbar enhancing mass which appeared hypercellular and was highly suspicious for orbital malignancy such as lymphoma. There was also atrophy his left optic nerve.   PET CT scan demonstrated mild focal hypermetabolic activity in the posterior aspect of the left globe carcinoma into an area of soft tissue thickening superior to the optic nerve. He was seen at [facility] where biopsy of his left orbit showed mucosa-associated lymphoid tissue (MALT) lymphoma of the orbit. He also has intermittent indeterminate stage uveitic glaucoma. His biopsy was performed at [facility] on 05/31/2017. MRI of the brain was normal. Recommendation is made for radiation therapy to his left orbit. He is seen today for opinion is doing fairly well he states he has very little vision in his left eye and some  irritation. 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 Marland Kitchen  PLANNED TREATMENT REGIMEN: Left orbital radiation  PAST MEDICAL HISTORY:  has a past medical history of Abnormal heart rhythm, Colon cancer ([place]), Hemorrhoid, History of chemotherapy, Mitral valve prolapse, and Rheumatoid arthritis ([place]).    PAST SURGICAL HISTORY:  Past Surgical History:  Procedure Laterality Date  . AQUEOUS SHUNT Left 10/12/2016   Procedure: AQUEOUS SHUNT with scleral patch graft  Left;  Surgeon: Ronnell Freshwater, MD;  Location: [place];  Service: Ophthalmology;  Laterality: Left;  tube shunt  . BIOPSY Left 10/12/2016   Procedure: BIOPSY (fresh & permanent specimens sent from same location)- conjunctiva left eye;  Surgeon: Ronnell Freshwater, MD;  Location: [place];  Service: Ophthalmology;  Laterality: Left;  . COLON SURGERY    . COLONOSCOPY  2013, 05/16/2008  . ESOPHAGOGASTRODUODENOSCOPY  06/04/08  . PORT-A-CATH REMOVAL  2010  . PORTACATH PLACEMENT  06/2008    FAMILY HISTORY: family history includes Arthritis in his mother; Colon cancer in his maternal grandfather; Heart murmur in his father; Rheumatologic disease in his maternal aunt.  SOCIAL HISTORY:  reports that he quit smoking about 8 years ago. His smoking use included cigarettes. He started smoking about 9 years ago. He has a 24.00 pack-year smoking history. he has never used smokeless tobacco. He reports that he drinks alcohol. He reports that he uses drugs.  ALLERGIES: Thorazine [chlorpromazine]  MEDICATIONS:  Current Outpatient Medications  Medication Sig Dispense Refill  .  brimonidine (ALPHAGAN P) 0.1 % SOLN Apply to eye.    . calcium carbonate (OS-CAL - DOSED IN MG OF ELEMENTAL CALCIUM) 1250 (500 Ca) MG tablet Take by mouth.    . cholecalciferol (VITAMIN D) 1000 UNITS tablet Take 1,000 Units by mouth every other day.      . dorzolamide-timolol  (COSOPT) 22.3-6.8 MG/ML ophthalmic solution Apply to eye.    . folic acid (FOLVITE) 1 MG tablet TAKE 1 TABLET (1 MG TOTAL) BY MOUTH ONCE DAILY.  11  . Multiple Vitamins-Minerals (DAILY MULTI) TABS Take by mouth daily.      . prednisoLONE acetate (PRED FORTE) 1 % ophthalmic suspension Place 1 drop into the left eye daily. X 14 days  0  . predniSONE (DELTASONE) 1 MG tablet 9mg  a day x 2 weeks, 8mg  a day x 2 weeks, 7mg  a day x 2 weeks, 6mg  a day x 2 weeks, 5mg  a day and stay (currently at 5 mg/day)    . acetaZOLAMIDE (DIAMOX) 500 MG capsule Take 500 mg by mouth 2 (two) times daily.  3  . Adalimumab (HUMIRA) 40 MG/0.8ML PSKT Inject into the skin every 14 (fourteen) days.    Marland Kitchen latanoprost (XALATAN) 0.005 % ophthalmic solution INSTILL ONE DROP INTO THE LEFT EYE EACH NIGHT AT BEDTIME  5  . methotrexate (RHEUMATREX) 2.5 MG tablet TAKE 8 TABLETS (20 MG TOTAL) BY MOUTH EVERY 7 (SEVEN) DAYS. 8 PILLS ONCE A WEEK WITH A MEAL  3  . omeprazole (PRILOSEC) 40 MG capsule Take 40 mg by mouth daily.     No current facility-administered medications for this encounter.     ECOG PERFORMANCE STATUS:  1 - Symptomatic but completely ambulatory  REVIEW OF SYSTEMS: Except for the problems with his eye Patient denies any weight loss, fatigue, weakness, fever, chills or night sweats. Patient denies any loss of vision, blurred vision. Patient denies any ringing  of the ears or hearing loss. No irregular heartbeat. Patient denies heart murmur or history of fainting. Patient denies any chest pain or pain radiating to her upper extremities. Patient denies any shortness of breath, difficulty breathing at night, cough or hemoptysis. Patient denies any swelling in the lower legs. Patient denies any nausea vomiting, vomiting of blood, or coffee ground material in the vomitus. Patient denies any stomach pain. Patient states has had normal bowel movements no significant constipation or diarrhea. Patient denies any dysuria, hematuria or  significant nocturia. Patient denies any problems walking, swelling in the joints or loss of balance. Patient denies any skin changes, loss of hair or loss of weight. Patient denies any excessive worrying or anxiety or significant depression. Patient denies any problems with insomnia. Patient denies excessive thirst, polyuria, polydipsia. Patient denies any swollen glands, patient denies easy bruising or easy bleeding. Patient denies any recent infections, allergies or URI. Patient "s visual fields have not changed significantly in recent time.    PHYSICAL EXAM: BP 108/69   Pulse 60   Temp (!) 97.2 F (36.2 C)   Resp 18   Wt 170 lb 15.5 oz (77.6 kg)   BMI 22.56 kg/m  Very little detection of vision in his left eye. There is darkening of his retina consistent with the known mass. No other evidence of subject gastric cervical supra clavicular adenopathy is noted. No peripheral adenopathy is identified. Well-developed well-nourished patient in NAD. HEENT reveals PERLA, EOMI, discs not visualized.  Oral cavity is clear. No oral mucosal lesions are identified. Neck is clear without evidence of cervical  or supraclavicular adenopathy. Lungs are clear to A&P. Cardiac examination is essentially unremarkable with regular rate and rhythm without murmur rub or thrill. Abdomen is benign with no organomegaly or masses noted. Motor sensory and DTR levels are equal and symmetric in the upper and lower extremities. Cranial nerves II through XII are grossly intact. Proprioception is intact. No peripheral adenopathy or edema is identified. No motor or sensory levels are noted. Crude visual fields are within normal range.  LABORATORY DATA: Pathology reports reviewed    RADIOLOGY RESULTS: MRI scans and PET/CT scans reviewed   IMPRESSION: MALT lymphoma of the left orbit in 56 year old male  PLAN: At this time I to go ahead with radiation therapy with curative intent to his left orbit. Would plan on delivering 3060  cGy in 17 fractions using IM RT radiation therapy to spare critical structures such as optic nerve brain contralateral eye and lacrimal gland risks and benefits of treatment including skin reaction fatigue irritation of the orbit possible further decrease in vision of his left orbit and drying of the eye all were discussed in detail with the patient. Although the dose for orbital lymphoma has been debated most clinicians would use a dose around 3000 cGy over 3 weeks. This dose will ensure greater than 90% control of his orbital lymphoma. Patient comprehends, per has my treatment plan well. Will use MRI fusion study to delineate actual tumor volume. I have personally scheduled and ordered CT simulation for later this week.  I would like to take this opportunity to thank you for allowing me to participate in the care of your patient.Armstead Peaks., MD

## 2017-06-29 ENCOUNTER — Inpatient Hospital Stay: Payer: BLUE CROSS/BLUE SHIELD | Attending: Internal Medicine | Admitting: Internal Medicine

## 2017-06-29 ENCOUNTER — Other Ambulatory Visit: Payer: Self-pay

## 2017-06-29 VITALS — BP 122/83 | HR 97 | Temp 96.8°F | Resp 18 | Wt 173.1 lb

## 2017-06-29 DIAGNOSIS — M069 Rheumatoid arthritis, unspecified: Secondary | ICD-10-CM

## 2017-06-29 DIAGNOSIS — I341 Nonrheumatic mitral (valve) prolapse: Secondary | ICD-10-CM | POA: Diagnosis not present

## 2017-06-29 DIAGNOSIS — Z9221 Personal history of antineoplastic chemotherapy: Secondary | ICD-10-CM | POA: Diagnosis not present

## 2017-06-29 DIAGNOSIS — C8599 Non-Hodgkin lymphoma, unspecified, extranodal and solid organ sites: Secondary | ICD-10-CM

## 2017-06-29 DIAGNOSIS — N289 Disorder of kidney and ureter, unspecified: Secondary | ICD-10-CM | POA: Diagnosis not present

## 2017-06-29 DIAGNOSIS — Z85038 Personal history of other malignant neoplasm of large intestine: Secondary | ICD-10-CM | POA: Diagnosis not present

## 2017-06-29 DIAGNOSIS — Z9049 Acquired absence of other specified parts of digestive tract: Secondary | ICD-10-CM | POA: Diagnosis not present

## 2017-06-29 DIAGNOSIS — Z7952 Long term (current) use of systemic steroids: Secondary | ICD-10-CM

## 2017-06-29 DIAGNOSIS — Z79899 Other long term (current) drug therapy: Secondary | ICD-10-CM

## 2017-06-29 DIAGNOSIS — H209 Unspecified iridocyclitis: Secondary | ICD-10-CM

## 2017-06-29 DIAGNOSIS — Z87891 Personal history of nicotine dependence: Secondary | ICD-10-CM

## 2017-06-29 NOTE — Progress Notes (Signed)
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 On Mxt [summer 2017]  # Lung nodule [incidental on CT]; last Nov 2014- 32m RML- STABLE [no further fu]     Colon carcinoma ([facility] [place]   05/07/2015 Initial Diagnosis    Colon carcinoma ([facility])       Can[place] of [place] colon ([facility]   05/05/2016 Initial Diagnosis    Cancer of descending colon ([facility])       Orbital lymphoma ([facility]      INTERVAL HISTORY:  A very pleasant 56year old male patient with above history of stage III colon cancer; and also possible left orbital lymphoma.  Patient finally underwent a biopsy of his left eye/is here to review the results of the pathology.  He also had a recent PET scan.  Unfortunately continues to have poor vision in his left eye. He denies any headaches. Denies any falls.  Denies any lumps or bumps anywhere else. No night sweats.   REVIEW OF SYSTEMS:  A complete 10 point review of system is done which is negative except mentioned above/history of present illness.   PAST MEDICAL HISTORY :  Past Medical History:  Diagnosis Date  . Abnormal heart rhythm   . [place] cancer ([place]    sigmoid  . Hemorrhoid   . History of chemotherapy    FOLFOX  . Mitral valve prolapse    Pt reports Dr. GRockey Situtold him NO MVP  . Rheumatoid arthritis ([facility]     PAST SURGICAL HISTORY :   Past Surgical History:  Procedure Laterality Date  . AQUEOUS SHUNT Left 10/12/2016   Procedure: AQUEOUS SHUNT with scleral patch graft  Left;   Surgeon: ARonnell Freshwater MD;  [place]  Service: Ophthalmology;  Laterality: Left;  tube shunt  . BIOPSY Left 10/12/2016   Procedure: BIOPSY (fresh & permanent specimens sent from same location)- conjunctiva left eye;  Surgeon: ARonnell Freshwater MD;  [place]  Service: Ophthalmology;  Laterality: Left;  . COLON SURGERY    . COLONOSCOPY  2013, 05/16/2008  . ESOPHAGOGASTRODUODENOSCOPY  06/04/08  . PORT-A-CATH REMOVAL  2010  . PORTACATH PLACEMENT  06/2008    FAMILY HISTORY :   Family History  Problem Relation Age of Onset  . Arthritis Mother   . Heart murmur Father   . Colon cancer Maternal Grandfather   . Rheumatologic disease Maternal Aunt     SOCIAL HISTORY:   Social History   Tobacco Use  . Smoking status: Former Smoker    Packs/day: 1.00    Years: 24.00    Pack years: 24.00    Types: Cigarettes    Start date: 04/15/2008    Last attempt to quit: 07/27/2008    Years since quitting: 8.9  . Smokeless tobacco: Never Used  Substance Use Topics  . Alcohol use: Yes    Alcohol/week: 0.0 oz    Comment: occasional alcohol use  . Drug use: Yes    Comment: last used 1989    ALLERGIES:  is allergic  to thorazine [chlorpromazine].  MEDICATIONS:  Current Outpatient Medications  Medication Sig Dispense Refill  . brimonidine (ALPHAGAN P) 0.1 % SOLN Apply to eye.    . calcium carbonate (OS-CAL - DOSED IN MG OF ELEMENTAL CALCIUM) 1250 (500 Ca) MG tablet Take by mouth.    . cholecalciferol (VITAMIN D) 1000 UNITS tablet Take 1,000 Units by mouth every other day.      . dorzolamide-timolol (COSOPT) 22.3-6.8 MG/ML ophthalmic solution Apply to eye.    . folic acid (FOLVITE) 1 MG tablet TAKE 1 TABLET (1 MG TOTAL) BY MOUTH ONCE DAILY.  11  . Multiple Vitamins-Minerals (DAILY MULTI) TABS Take by mouth daily.      . predniSONE (DELTASONE) 1 MG tablet 71m a day x 2 weeks, 8243ma day x 2 weeks, 43m343m day x 2 weeks, 6mg66mday x 2 weeks, 5mg 33mday and stay (currently at 5 mg/day)    . methotrexate (RHEUMATREX) 2.5 MG tablet TAKE 8 TABLETS (20 MG TOTAL) BY MOUTH EVERY 7 (SEVEN) DAYS. 8 PILLS ONCE A WEEK WITH A MEAL  3  . omeprazole (PRILOSEC) 40 MG capsule Take 40 mg by mouth daily.    . prednisoLONE acetate (PRED FORTE) 1 % ophthalmic suspension Place 1 drop into the left eye daily. X 14 days  0   No current facility-administered medications for this visit.     PHYSICAL EXAMINATION: ECOG PERFORMANCE STATUS: 0 - Asymptomatic  BP 122/83 (BP Location: Left Arm, Patient Position: Sitting)   Pulse 97   Temp (!) 96.8 F (36 C) (Tympanic)   Resp 18   Wt 173 lb 1 oz (78.5 kg)   BMI 22.83 kg/m   Filed Weights   06/29/17 0912  Weight: 173 lb 1 oz (78.5 kg)    GENERAL: Well-nourished well-developed; Alert, no distress and comfortable.   Alone EYES: no pallor or icterus; positive for erythema and swelling of the left eye. OROPHARYNX: no thrush or ulceration; good dentition  NECK: supple, no masses felt LYMPH:  no palpable lymphadenopathy in the cervical, axillary or inguinal regions LUNGS: clear to auscultation and  No wheeze or crackles HEART/CVS: regular rate & rhythm and no murmurs; No lower extremity edema ABDOMEN:abdomen soft, non-tender and normal bowel sounds Musculoskeletal:no cyanosis of digits and no clubbing  PSYCH: alert & oriented x 3 with fluent speech NEURO: no focal motor/sensory deficits SKIN:  no rashes or significant lesions  LABORATORY DATA:  I have reviewed the data as listed    Component Value Date/Time   NA 137 04/06/2017 0929   NA 138 05/03/2014 1041   K 3.6 04/06/2017 0929   K 3.8 05/03/2014 1041   CL 101 04/06/2017 0929   CL 103 05/03/2014 1041   CO2 29 04/06/2017 0929   CO2 30 05/03/2014 1041   GLUCOSE 100 (H) 04/06/2017 0929   GLUCOSE 102 (H) 05/03/2014 1041   BUN 16 04/06/2017 0929   BUN 15 05/03/2014 1041   CREATININE 1.03 04/06/2017 0929   CREATININE 1.01 05/03/2014 1041    CALCIUM 9.0 04/06/2017 0929   CALCIUM 9.2 05/03/2014 1041   PROT 7.0 04/06/2017 0929   PROT 7.5 05/03/2014 1041   ALBUMIN 4.2 04/06/2017 0929   ALBUMIN 3.6 05/03/2014 1041   AST 24 04/06/2017 0929   AST 19 05/03/2014 1041   ALT 16 (L) 04/06/2017 0929   ALT 20 05/03/2014 1041   ALKPHOS 39 04/06/2017 0929   ALKPHOS 52 05/03/2014 1041   BILITOT 0.9 04/06/2017 0929  BILITOT 0.7 05/03/2014 1041   GFRNONAA >60 04/06/2017 0929   GFRNONAA >60 05/03/2014 1041   GFRNONAA >60 05/04/2013 0952   GFRAA >60 04/06/2017 0929   GFRAA >60 05/03/2014 1041   GFRAA >60 05/04/2013 0952    No results found for: SPEP, UPEP  Lab Results  Component Value Date   WBC 7.8 04/06/2017   NEUTROABS 4.6 04/06/2017   HGB 14.8 04/06/2017   HCT 42.4 04/06/2017   MCV 91.2 04/06/2017   PLT 222 04/06/2017      Chemistry      Component Value Date/Time   NA 137 04/06/2017 0929   NA 138 05/03/2014 1041   K 3.6 04/06/2017 0929   K 3.8 05/03/2014 1041   CL 101 04/06/2017 0929   CL 103 05/03/2014 1041   CO2 29 04/06/2017 0929   CO2 30 05/03/2014 1041   BUN 16 04/06/2017 0929   BUN 15 05/03/2014 1041   CREATININE 1.03 04/06/2017 0929   CREATININE 1.01 05/03/2014 1041      Component Value Date/Time   CALCIUM 9.0 04/06/2017 0929   CALCIUM 9.2 05/03/2014 1041   ALKPHOS 39 04/06/2017 0929   ALKPHOS 52 05/03/2014 1041   AST 24 04/06/2017 0929   AST 19 05/03/2014 1041   ALT 16 (L) 04/06/2017 0929   ALT 20 05/03/2014 1041   BILITOT 0.9 04/06/2017 0929   BILITOT 0.7 05/03/2014 1041       RADIOGRAPHIC STUDIES: I have personally reviewed the radiological images as listed and agreed with the findings in the report. No results found.  IMPRESSION: 1. Stable exam. No evidence for lymphadenopathy in the chest, abdomen, or pelvis. 2. 15 mm well-defined hypoattenuating lesion in the right kidney is most likely a cyst, only minimally increased in the nearly 4 year interval since prior  exam.   Electronically Signed   By: Misty Stanley M.D.   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  PET scan negative for any distant disease; shows mild uptake in the posterior left orbit..  I would not recommend bone marrow biopsy.  # Recommend radiation; patient has met with radiation oncology.  Radiation in general should cure his lymphoma.  I personally discussed with Dr. Donella Stade.  # COLON CA STAGE III [2009] s/p FOLFOX; clinically no evidence of recurrence noted/based on recent imaging.  # ? Autoimmune- uveitis/scleritis/question rheumatoid arthritis-the diagnosis of lymphoma should not contraindicate use of immunomodulator therapy needed for his autoimmune rheumatologic condition.   #The above plan of care was discussed with the patient in detail.  All questions were answered.  #Patient will follow up with me in 2 months/no labs.   # I reviewed the blood work- with the patient in detail; also reviewed the imaging independently [as summarized above]; and with the patient in detail.      Cammie Sickle, MD 07/01/2017 4:11 PM

## 2017-06-29 NOTE — Assessment & Plan Note (Addendum)
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  PET scan negative for any distant disease; shows mild uptake in the posterior left orbit..  I would not recommend bone marrow biopsy.  # Recommend radiation; patient has met with radiation oncology.  Radiation in general should cure his lymphoma.  I personally discussed with Dr. Donella Stade.  # COLON CA STAGE III [2009] s/p FOLFOX; clinically no evidence of recurrence noted/based on recent imaging.  # ? Autoimmune- uveitis/scleritis/question rheumatoid arthritis-the diagnosis of lymphoma should not contraindicate use of immunomodulator therapy needed for his autoimmune rheumatologic condition.   #The above plan of care was discussed with the patient in detail.  All questions were answered.  #Patient will follow up with me in 2 months/no labs.   # I reviewed the blood work- with the patient in detail; also reviewed the imaging independently [as summarized above]; and with the patient in detail.

## 2017-06-29 NOTE — Assessment & Plan Note (Deleted)
#   Left orbital swelling-September 2018- MRI of the brain- negative for any malignancy involving the leptomeninges/parenchyma; however left orbital- thickening noted inflammation versus lymphoma versus hemorrhage. CT of the chest abdomen pelvis-negative for malignancy.   # Discussed with the patient that biopsy would be needed for further evaluation/ and recommendations for treatment. He is awaiting evaluation with [facility] oculoplastic department.   # COLON CA STAGE III [2009] s/p FOLFOX; clinically no evidence of recurrence noted. CT chest abdomen pelvis negative for any malignancy [September 2018]. CEA pending today.  # ? Autoimmune- uveitis/scleritis/question rheumatoid arthritis- patient is currently started on infliximab as per rheumatology/Dr.[facility]ern[facility]le. Would continue infliximab at this time We'll also fax the MRI report/send disk to Dr.Jaffe.   # The above plan of care was discussed with   Dr. Mina Marble, [facility]; and also with Dr.Kern[facility]le; Dr.jaffe; Dr.Lynegold  Follow up in 8 weeks.Marland Kitchen

## 2017-06-29 NOTE — Progress Notes (Signed)
Here for follow up. Stated overall he is doing well.

## 2017-06-30 ENCOUNTER — Ambulatory Visit
Admission: RE | Admit: 2017-06-30 | Discharge: 2017-06-30 | Disposition: A | Discharge status: Home / Self Care | Payer: BLUE CROSS/BLUE SHIELD | Source: Ambulatory Visit | Attending: Radiation Oncology | Admitting: Radiation Oncology

## 2017-06-30 DIAGNOSIS — C884 Extranodal marginal zone B-cell lymphoma of mucosa-associated lymphoid tissue [MALT-lymphoma]: Secondary | ICD-10-CM | POA: Diagnosis not present

## 2017-07-06 DIAGNOSIS — C884 Extranodal marginal zone B-cell lymphoma of mucosa-associated lymphoid tissue [MALT-lymphoma]: Secondary | ICD-10-CM | POA: Diagnosis not present

## 2017-07-09 ENCOUNTER — Other Ambulatory Visit: Payer: Self-pay | Admitting: *Deleted

## 2017-07-09 DIAGNOSIS — C8599 Non-Hodgkin lymphoma, unspecified, extranodal and solid organ sites: Secondary | ICD-10-CM

## 2017-07-13 ENCOUNTER — Ambulatory Visit
Admission: RE | Admit: 2017-07-13 | Discharge: 2017-07-13 | Disposition: A | Discharge status: Home / Self Care | Payer: BLUE CROSS/BLUE SHIELD | Source: Ambulatory Visit | Attending: Radiation Oncology | Admitting: Radiation Oncology

## 2017-07-14 ENCOUNTER — Ambulatory Visit
Admission: RE | Admit: 2017-07-14 | Discharge: 2017-07-14 | Disposition: A | Discharge status: Home / Self Care | Payer: BLUE CROSS/BLUE SHIELD | Source: Ambulatory Visit | Attending: Radiation Oncology | Admitting: Radiation Oncology

## 2017-07-14 ENCOUNTER — Ambulatory Visit: Payer: BLUE CROSS/BLUE SHIELD

## 2017-07-14 DIAGNOSIS — C884 Extranodal marginal zone B-cell lymphoma of mucosa-associated lymphoid tissue [MALT-lymphoma]: Secondary | ICD-10-CM | POA: Diagnosis not present

## 2017-07-15 ENCOUNTER — Ambulatory Visit
Admission: RE | Admit: 2017-07-15 | Discharge: 2017-07-15 | Disposition: A | Discharge status: Home / Self Care | Payer: BLUE CROSS/BLUE SHIELD | Source: Ambulatory Visit | Attending: Radiation Oncology | Admitting: Radiation Oncology

## 2017-07-15 DIAGNOSIS — C884 Extranodal marginal zone B-cell lymphoma of mucosa-associated lymphoid tissue [MALT-lymphoma]: Secondary | ICD-10-CM | POA: Diagnosis not present

## 2017-07-16 ENCOUNTER — Ambulatory Visit
Admission: RE | Admit: 2017-07-16 | Discharge: 2017-07-16 | Disposition: A | Discharge status: Home / Self Care | Payer: BLUE CROSS/BLUE SHIELD | Source: Ambulatory Visit | Attending: Radiation Oncology | Admitting: Radiation Oncology

## 2017-07-16 DIAGNOSIS — C884 Extranodal marginal zone B-cell lymphoma of mucosa-associated lymphoid tissue [MALT-lymphoma]: Secondary | ICD-10-CM | POA: Diagnosis not present

## 2017-07-19 ENCOUNTER — Ambulatory Visit
Admission: RE | Admit: 2017-07-19 | Discharge: 2017-07-19 | Disposition: A | Discharge status: Home / Self Care | Payer: BLUE CROSS/BLUE SHIELD | Source: Ambulatory Visit | Attending: Radiation Oncology | Admitting: Radiation Oncology

## 2017-07-19 DIAGNOSIS — C884 Extranodal marginal zone B-cell lymphoma of mucosa-associated lymphoid tissue [MALT-lymphoma]: Secondary | ICD-10-CM | POA: Diagnosis not present

## 2017-07-21 ENCOUNTER — Ambulatory Visit
Admission: RE | Admit: 2017-07-21 | Discharge: 2017-07-21 | Disposition: A | Discharge status: Home / Self Care | Payer: BLUE CROSS/BLUE SHIELD | Source: Ambulatory Visit | Attending: Radiation Oncology | Admitting: Radiation Oncology

## 2017-07-21 DIAGNOSIS — C884 Extranodal marginal zone B-cell lymphoma of mucosa-associated lymphoid tissue [MALT-lymphoma]: Secondary | ICD-10-CM | POA: Diagnosis not present

## 2017-07-22 ENCOUNTER — Ambulatory Visit
Admission: RE | Admit: 2017-07-22 | Discharge: 2017-07-22 | Disposition: A | Discharge status: Home / Self Care | Payer: BLUE CROSS/BLUE SHIELD | Source: Ambulatory Visit | Attending: Radiation Oncology | Admitting: Radiation Oncology

## 2017-07-22 ENCOUNTER — Other Ambulatory Visit: Payer: BLUE CROSS/BLUE SHIELD

## 2017-07-22 DIAGNOSIS — C884 Extranodal marginal zone B-cell lymphoma of mucosa-associated lymphoid tissue [MALT-lymphoma]: Secondary | ICD-10-CM | POA: Diagnosis not present

## 2017-07-23 ENCOUNTER — Ambulatory Visit
Admission: RE | Admit: 2017-07-23 | Discharge: 2017-07-23 | Disposition: A | Discharge status: Home / Self Care | Payer: BLUE CROSS/BLUE SHIELD | Source: Ambulatory Visit | Attending: Radiation Oncology | Admitting: Radiation Oncology

## 2017-07-26 ENCOUNTER — Ambulatory Visit
Admission: RE | Admit: 2017-07-26 | Discharge: 2017-07-26 | Disposition: A | Discharge status: Home / Self Care | Payer: BLUE CROSS/BLUE SHIELD | Source: Ambulatory Visit | Attending: Radiation Oncology | Admitting: Radiation Oncology

## 2017-07-26 ENCOUNTER — Inpatient Hospital Stay: Payer: BLUE CROSS/BLUE SHIELD

## 2017-07-26 DIAGNOSIS — C8599 Non-Hodgkin lymphoma, unspecified, extranodal and solid organ sites: Secondary | ICD-10-CM | POA: Diagnosis not present

## 2017-07-26 DIAGNOSIS — C884 Extranodal marginal zone B-cell lymphoma of mucosa-associated lymphoid tissue [MALT-lymphoma]: Secondary | ICD-10-CM | POA: Diagnosis not present

## 2017-07-26 LAB — CBC
HCT: 43.2 % (ref 40.0–52.0)
Hemoglobin: 14.7 g/dL (ref 13.0–18.0)
MCH: 31.1 pg (ref 26.0–34.0)
MCHC: 34.1 g/dL (ref 32.0–36.0)
MCV: 91.2 fL (ref 80.0–100.0)
Platelets: 215 10*3/uL (ref 150–440)
RBC: 4.73 MIL/uL (ref 4.40–5.90)
RDW: 13.2 % (ref 11.5–14.5)
WBC: 7.1 10*3/uL (ref 3.8–10.6)

## 2017-07-27 DIAGNOSIS — Z510 Encounter for antineoplastic radiation therapy: Secondary | ICD-10-CM | POA: Diagnosis not present

## 2017-07-27 DIAGNOSIS — Z79899 Other long term (current) drug therapy: Secondary | ICD-10-CM | POA: Diagnosis not present

## 2017-07-27 DIAGNOSIS — Z87891 Personal history of nicotine dependence: Secondary | ICD-10-CM | POA: Diagnosis not present

## 2017-07-27 DIAGNOSIS — I341 Nonrheumatic mitral (valve) prolapse: Secondary | ICD-10-CM | POA: Diagnosis not present

## 2017-07-27 DIAGNOSIS — M069 Rheumatoid arthritis, unspecified: Secondary | ICD-10-CM | POA: Diagnosis not present

## 2017-07-27 DIAGNOSIS — Z800 Family history of malignant neoplasm of digestive organs: Secondary | ICD-10-CM | POA: Diagnosis not present

## 2017-07-27 DIAGNOSIS — Z85038 Personal history of other malignant neoplasm of large intestine: Secondary | ICD-10-CM | POA: Diagnosis not present

## 2017-07-27 DIAGNOSIS — C884 Extranodal marginal zone B-cell lymphoma of mucosa-associated lymphoid tissue [MALT-lymphoma]: Secondary | ICD-10-CM | POA: Diagnosis present

## 2017-07-27 DIAGNOSIS — I499 Cardiac arrhythmia, unspecified: Secondary | ICD-10-CM | POA: Diagnosis not present

## 2017-07-27 DIAGNOSIS — Z9221 Personal history of antineoplastic chemotherapy: Secondary | ICD-10-CM | POA: Diagnosis not present

## 2017-07-28 ENCOUNTER — Ambulatory Visit
Admission: RE | Admit: 2017-07-28 | Discharge: 2017-07-28 | Disposition: A | Discharge status: Home / Self Care | Payer: BLUE CROSS/BLUE SHIELD | Source: Ambulatory Visit | Attending: Radiation Oncology | Admitting: Radiation Oncology

## 2017-07-28 DIAGNOSIS — C884 Extranodal marginal zone B-cell lymphoma of mucosa-associated lymphoid tissue [MALT-lymphoma]: Secondary | ICD-10-CM | POA: Diagnosis not present

## 2017-07-29 ENCOUNTER — Ambulatory Visit
Admission: RE | Admit: 2017-07-29 | Discharge: 2017-07-29 | Disposition: A | Discharge status: Home / Self Care | Payer: BLUE CROSS/BLUE SHIELD | Source: Ambulatory Visit | Attending: Radiation Oncology | Admitting: Radiation Oncology

## 2017-07-29 DIAGNOSIS — C884 Extranodal marginal zone B-cell lymphoma of mucosa-associated lymphoid tissue [MALT-lymphoma]: Secondary | ICD-10-CM | POA: Diagnosis not present

## 2017-07-30 ENCOUNTER — Ambulatory Visit
Admission: RE | Admit: 2017-07-30 | Discharge: 2017-07-30 | Disposition: A | Discharge status: Home / Self Care | Payer: BLUE CROSS/BLUE SHIELD | Source: Ambulatory Visit | Attending: Radiation Oncology | Admitting: Radiation Oncology

## 2017-07-30 DIAGNOSIS — C884 Extranodal marginal zone B-cell lymphoma of mucosa-associated lymphoid tissue [MALT-lymphoma]: Secondary | ICD-10-CM | POA: Diagnosis not present

## 2017-08-02 ENCOUNTER — Inpatient Hospital Stay: Payer: Managed Care, Other (non HMO) | Attending: Internal Medicine

## 2017-08-02 ENCOUNTER — Ambulatory Visit
Admission: RE | Admit: 2017-08-02 | Discharge: 2017-08-02 | Disposition: A | Discharge status: Home / Self Care | Payer: BLUE CROSS/BLUE SHIELD | Source: Ambulatory Visit | Attending: Radiation Oncology | Admitting: Radiation Oncology

## 2017-08-02 DIAGNOSIS — H5450 Low vision, one eye, unspecified eye: Secondary | ICD-10-CM | POA: Diagnosis not present

## 2017-08-02 DIAGNOSIS — C884 Extranodal marginal zone B-cell lymphoma of mucosa-associated lymphoid tissue [MALT-lymphoma]: Secondary | ICD-10-CM | POA: Diagnosis not present

## 2017-08-02 DIAGNOSIS — Z7982 Long term (current) use of aspirin: Secondary | ICD-10-CM | POA: Insufficient documentation

## 2017-08-02 DIAGNOSIS — Y842 Radiological procedure and radiotherapy as the cause of abnormal reaction of the patient, or of later complication, without mention of misadventure at the time of the procedure: Secondary | ICD-10-CM | POA: Insufficient documentation

## 2017-08-02 DIAGNOSIS — Z79899 Other long term (current) drug therapy: Secondary | ICD-10-CM | POA: Insufficient documentation

## 2017-08-02 DIAGNOSIS — H209 Unspecified iridocyclitis: Secondary | ICD-10-CM | POA: Insufficient documentation

## 2017-08-02 DIAGNOSIS — M069 Rheumatoid arthritis, unspecified: Secondary | ICD-10-CM | POA: Diagnosis not present

## 2017-08-02 DIAGNOSIS — Z800 Family history of malignant neoplasm of digestive organs: Secondary | ICD-10-CM | POA: Insufficient documentation

## 2017-08-02 DIAGNOSIS — Z85038 Personal history of other malignant neoplasm of large intestine: Secondary | ICD-10-CM | POA: Insufficient documentation

## 2017-08-02 DIAGNOSIS — I341 Nonrheumatic mitral (valve) prolapse: Secondary | ICD-10-CM | POA: Insufficient documentation

## 2017-08-02 DIAGNOSIS — H47292 Other optic atrophy, left eye: Secondary | ICD-10-CM | POA: Diagnosis not present

## 2017-08-02 DIAGNOSIS — C8599 Non-Hodgkin lymphoma, unspecified, extranodal and solid organ sites: Secondary | ICD-10-CM | POA: Insufficient documentation

## 2017-08-02 DIAGNOSIS — Z87891 Personal history of nicotine dependence: Secondary | ICD-10-CM | POA: Diagnosis not present

## 2017-08-02 LAB — CBC
HCT: 43.1 % (ref 40.0–52.0)
Hemoglobin: 14.7 g/dL (ref 13.0–18.0)
MCH: 31.1 pg (ref 26.0–34.0)
MCHC: 34.1 g/dL (ref 32.0–36.0)
MCV: 91.1 fL (ref 80.0–100.0)
Platelets: 209 10*3/uL (ref 150–440)
RBC: 4.73 MIL/uL (ref 4.40–5.90)
RDW: 13.1 % (ref 11.5–14.5)
WBC: 7.5 10*3/uL (ref 3.8–10.6)

## 2017-08-03 ENCOUNTER — Ambulatory Visit
Admission: RE | Admit: 2017-08-03 | Discharge: 2017-08-03 | Disposition: A | Discharge status: Home / Self Care | Payer: BLUE CROSS/BLUE SHIELD | Source: Ambulatory Visit | Attending: Radiation Oncology | Admitting: Radiation Oncology

## 2017-08-03 DIAGNOSIS — C884 Extranodal marginal zone B-cell lymphoma of mucosa-associated lymphoid tissue [MALT-lymphoma]: Secondary | ICD-10-CM | POA: Diagnosis not present

## 2017-08-04 ENCOUNTER — Ambulatory Visit
Admission: RE | Admit: 2017-08-04 | Discharge: 2017-08-04 | Disposition: A | Discharge status: Home / Self Care | Payer: BLUE CROSS/BLUE SHIELD | Source: Ambulatory Visit | Attending: Radiation Oncology | Admitting: Radiation Oncology

## 2017-08-04 DIAGNOSIS — C884 Extranodal marginal zone B-cell lymphoma of mucosa-associated lymphoid tissue [MALT-lymphoma]: Secondary | ICD-10-CM | POA: Diagnosis not present

## 2017-08-05 ENCOUNTER — Ambulatory Visit
Admission: RE | Admit: 2017-08-05 | Discharge: 2017-08-05 | Disposition: A | Discharge status: Home / Self Care | Payer: BLUE CROSS/BLUE SHIELD | Source: Ambulatory Visit | Attending: Radiation Oncology | Admitting: Radiation Oncology

## 2017-08-05 DIAGNOSIS — C884 Extranodal marginal zone B-cell lymphoma of mucosa-associated lymphoid tissue [MALT-lymphoma]: Secondary | ICD-10-CM | POA: Diagnosis not present

## 2017-08-06 ENCOUNTER — Ambulatory Visit
Admission: RE | Admit: 2017-08-06 | Discharge: 2017-08-06 | Disposition: A | Discharge status: Home / Self Care | Payer: BLUE CROSS/BLUE SHIELD | Source: Ambulatory Visit | Attending: Radiation Oncology | Admitting: Radiation Oncology

## 2017-08-06 DIAGNOSIS — C884 Extranodal marginal zone B-cell lymphoma of mucosa-associated lymphoid tissue [MALT-lymphoma]: Secondary | ICD-10-CM | POA: Diagnosis not present

## 2017-08-09 ENCOUNTER — Ambulatory Visit
Admission: RE | Admit: 2017-08-09 | Discharge: 2017-08-09 | Disposition: A | Discharge status: Home / Self Care | Payer: BLUE CROSS/BLUE SHIELD | Source: Ambulatory Visit | Attending: Radiation Oncology | Admitting: Radiation Oncology

## 2017-08-09 ENCOUNTER — Ambulatory Visit: Payer: BLUE CROSS/BLUE SHIELD

## 2017-08-09 DIAGNOSIS — C884 Extranodal marginal zone B-cell lymphoma of mucosa-associated lymphoid tissue [MALT-lymphoma]: Secondary | ICD-10-CM | POA: Diagnosis not present

## 2017-08-10 ENCOUNTER — Ambulatory Visit: Payer: BLUE CROSS/BLUE SHIELD

## 2017-08-10 ENCOUNTER — Ambulatory Visit
Admission: RE | Admit: 2017-08-10 | Discharge: 2017-08-10 | Disposition: A | Discharge status: Home / Self Care | Payer: BLUE CROSS/BLUE SHIELD | Source: Ambulatory Visit | Attending: Radiation Oncology | Admitting: Radiation Oncology

## 2017-08-10 DIAGNOSIS — C884 Extranodal marginal zone B-cell lymphoma of mucosa-associated lymphoid tissue [MALT-lymphoma]: Secondary | ICD-10-CM | POA: Diagnosis not present

## 2017-08-20 ENCOUNTER — Encounter: Payer: Self-pay | Admitting: *Deleted

## 2017-08-24 ENCOUNTER — Inpatient Hospital Stay (HOSPITAL_BASED_OUTPATIENT_CLINIC_OR_DEPARTMENT_OTHER): Payer: Managed Care, Other (non HMO) | Admitting: Internal Medicine

## 2017-08-24 ENCOUNTER — Encounter: Payer: Self-pay | Admitting: Internal Medicine

## 2017-08-24 VITALS — BP 93/56 | HR 58 | Temp 97.7°F | Wt 171.6 lb

## 2017-08-24 DIAGNOSIS — M069 Rheumatoid arthritis, unspecified: Secondary | ICD-10-CM

## 2017-08-24 DIAGNOSIS — Z7982 Long term (current) use of aspirin: Secondary | ICD-10-CM | POA: Diagnosis not present

## 2017-08-24 DIAGNOSIS — H5450 Low vision, one eye, unspecified eye: Secondary | ICD-10-CM

## 2017-08-24 DIAGNOSIS — Z85038 Personal history of other malignant neoplasm of large intestine: Secondary | ICD-10-CM | POA: Diagnosis not present

## 2017-08-24 DIAGNOSIS — H209 Unspecified iridocyclitis: Secondary | ICD-10-CM

## 2017-08-24 DIAGNOSIS — H47292 Other optic atrophy, left eye: Secondary | ICD-10-CM | POA: Diagnosis not present

## 2017-08-24 DIAGNOSIS — C8599 Non-Hodgkin lymphoma, unspecified, extranodal and solid organ sites: Secondary | ICD-10-CM | POA: Diagnosis not present

## 2017-08-24 DIAGNOSIS — Z79899 Other long term (current) drug therapy: Secondary | ICD-10-CM | POA: Diagnosis not present

## 2017-08-24 DIAGNOSIS — Y842 Radiological procedure and radiotherapy as the cause of abnormal reaction of the patient, or of later complication, without mention of misadventure at the time of the procedure: Secondary | ICD-10-CM | POA: Diagnosis not present

## 2017-08-24 DIAGNOSIS — Z800 Family history of malignant neoplasm of digestive organs: Secondary | ICD-10-CM | POA: Diagnosis not present

## 2017-08-24 DIAGNOSIS — I341 Nonrheumatic mitral (valve) prolapse: Secondary | ICD-10-CM

## 2017-08-24 DIAGNOSIS — Z87891 Personal history of nicotine dependence: Secondary | ICD-10-CM

## 2017-08-24 NOTE — Assessment & Plan Note (Addendum)
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 S/p RT [jan 15th 2019].  #Discussed the overall good prognosis of mucosa-associated lymphoma of the orbit; should respond well to radiation.  Discussed that this should not be life-threatening/or metastasize.  We will plan MRI in approximately 3 months  # Left eye discomfort/tearing-symptomatic treatment; improving followed by ophthalmology.   # ? Autoimmune- uveitis/scleritis/question rheumatoid arthritis- continue to follow up with Rheumatology.  # follow up in 3 months /MRI orbit few days prior.   Cc; Dr.Jaffe; [facility]

## 2017-08-24 NOTE — Progress Notes (Signed)
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  Patient complains of tearing; discomfort few days after radiation.  Patient finished radiation approximately 2 weeks ago.  He has been followed by ophthalmology.  He has been given moisturizing eyedrops; steroid eyedrops.  Symptoms are improving.  Unfortunately continues to have poor vision in his left eye. He denies any headaches. Denies any falls.  REVIEW OF SYSTEMS:  A complete 10 point review of system is done which is negative except mentioned above/history of present illness.   PAST MEDICAL HISTORY :  Past Medical History:  Diagnosis Date  . Abnormal heart rhythm   . Colon cancer ([facility]    sigmoid  . Hemorrhoid   . History of chemotherapy    FOLFOX  . Mitral valve prolapse    Pt reports Dr. GRockey Situtold him NO MVP  . Rheumatoid arthritis ([facility]     PAST SURGICAL HISTORY :   Past Surgical History:  Procedure Laterality Date  . AQUEOUS SHUNT Left 10/12/2016    Procedure: AQUEOUS SHUNT with scleral patch graft  Left;  Surgeon: ARonnell Freshwater MD;  [place]  Service: Ophthalmology;  Laterality: Left;  tube shunt  . BIOPSY Left 10/12/2016   Procedure: BIOPSY (fresh & permanent specimens sent from same location)- conjunctiva left eye;  Surgeon: ARonnell Freshwater MD;  [place] [facility]  Service: Ophthalmology;  Laterality: Left;  . COLON SURGERY    . COLONOSCOPY  2013, 05/16/2008  . ESOPHAGOGASTRODUODENOSCOPY  06/04/08  . PORT-A-CATH REMOVAL  2010  . PORTACATH PLACEMENT  06/2008    FAMILY HISTORY :   Family History  Problem Relation Age of Onset  . Arthritis Mother   . Heart murmur Father   . Colon cancer Maternal Grandfather   . Rheumatologic disease Maternal Aunt     SOCIAL HISTORY:   Social History   Tobacco Use  . Smoking status: Former Smoker    Packs/day: 1.00    Years: 24.00    Pack years: 24.00    Types: Cigarettes    Start date: 04/15/2008    Last attempt to quit: 07/27/2008    Years since quitting: 9.0  . Smokeless tobacco: Never Used  Substance Use Topics  . Alcohol use: Yes    Alcohol/week: 0.0 oz    Comment: occasional alcohol use  . Drug use: Yes    Comment: last used 1989    ALLERGIES:  is allergic to thorazine [chlorpromazine].  MEDICATIONS:  Current Outpatient Medications  Medication Sig Dispense Refill  . brimonidine (ALPHAGAN P) 0.1 % SOLN Apply to eye.    . calcium carbonate (OS-CAL - DOSED IN MG OF ELEMENTAL CALCIUM) 1250 (500 Ca) MG tablet Take by mouth.    . cholecalciferol (VITAMIN D) 1000 UNITS tablet Take 1,000 Units by mouth every other day.      . folic acid (FOLVITE) 1 MG tablet TAKE 1 TABLET (1 MG TOTAL) BY MOUTH ONCE DAILY.  11  . Multiple Vitamins-Minerals (DAILY MULTI) TABS Take by mouth daily.      . predniSONE (DELTASONE) 1 MG tablet 31m a day x 2 weeks, 841ma day x 2 weeks, 10m51m day x 2 weeks, 6mg14mday x 2 weeks, 5mg 15may and stay (currently  at 5 mg/day)    . dorzolamide-timolol (COSOPT) 22.3-6.8 MG/ML ophthalmic solution Apply to eye.    . methotrexate (RHEUMATREX) 2.5 MG tablet TAKE 8 TABLETS (20 MG TOTAL) BY MOUTH EVERY 7 (SEVEN) DAYS. 8 PILLS ONCE A WEEK WITH A MEAL  3  . omeprazole (PRILOSEC) 40 MG capsule Take 40 mg by mouth daily.    . prednisoLONE acetate (PRED FORTE) 1 % ophthalmic suspension Place 1 drop into the left eye daily. X 14 days  0   No current facility-administered medications for this visit.     PHYSICAL EXAMINATION: ECOG PERFORMANCE STATUS: 0 - Asymptomatic  BP (!) 93/56 (BP Location: Left Arm, Patient Position: Sitting)   Pulse (!) 58   Temp 97.7 F (36.5 C) (Oral)   Wt 171 lb 10.1 oz (77.9 kg)   SpO2 98%   BMI 22.64 kg/m   Filed Weights   08/24/17 1006  Weight: 171 lb 10.1 oz (77.9 kg)    GENERAL: Well-nourished well-developed; Alert, no distress and comfortable.   Alone EYES: no pallor or icterus; positive for erythema and swelling of the left eye. OROPHARYNX: no thrush or ulceration; good dentition  NECK: supple, no masses felt LYMPH:  no palpable lymphadenopathy in the cervical, axillary or inguinal regions LUNGS: clear to auscultation and  No wheeze or crackles HEART/CVS: regular rate & rhythm and no murmurs; No lower extremity edema ABDOMEN:abdomen soft, non-tender and normal bowel sounds Musculoskeletal:no cyanosis of digits and no clubbing  PSYCH: alert & oriented x 3 with fluent speech NEURO: no focal motor/sensory deficits SKIN:  no rashes or significant lesions  LABORATORY DATA:  I have reviewed the data as listed    Component Value Date/Time   NA 137 04/06/2017 0929   NA 138 05/03/2014 1041   K 3.6 04/06/2017 0929   K 3.8 05/03/2014 1041   CL 101 04/06/2017 0929   CL 103 05/03/2014 1041   CO2 29 04/06/2017 0929   CO2 30 05/03/2014 1041   GLUCOSE 100 (H) 04/06/2017 0929   GLUCOSE 102 (H) 05/03/2014 1041   BUN 16 04/06/2017 0929   BUN 15 05/03/2014 1041   CREATININE  1.03 04/06/2017 0929   CREATININE 1.01 05/03/2014 1041   CALCIUM 9.0 04/06/2017 0929   CALCIUM 9.2 05/03/2014 1041   PROT 7.0 04/06/2017 0929   PROT 7.5 05/03/2014 1041   ALBUMIN 4.2 04/06/2017 0929   ALBUMIN 3.6 05/03/2014 1041   AST 24 04/06/2017 0929   AST 19 05/03/2014 1041   ALT 16 (L) 04/06/2017 0929   ALT 20 05/03/2014 1041   ALKPHOS 39 04/06/2017 0929   ALKPHOS 52 05/03/2014 1041   BILITOT 0.9  04/06/2017 0929   BILITOT 0.7 05/03/2014 1041   GFRNONAA >60 04/06/2017 0929   GFRNONAA >60 05/03/2014 1041   GFRNONAA >60 05/04/2013 0952   GFRAA >60 04/06/2017 0929   GFRAA >60 05/03/2014 1041   GFRAA >60 05/04/2013 0952    No results found for: SPEP, UPEP  Lab Results  Component Value Date   WBC 7.5 08/02/2017   NEUTROABS 4.6 04/06/2017   HGB 14.7 08/02/2017   HCT 43.1 08/02/2017   MCV 91.1 08/02/2017   PLT 209 08/02/2017      Chemistry      Component Value Date/Time   NA 137 04/06/2017 0929   NA 138 05/03/2014 1041   K 3.6 04/06/2017 0929   K 3.8 05/03/2014 1041   CL 101 04/06/2017 0929   CL 103 05/03/2014 1041   CO2 29 04/06/2017 0929   CO2 30 05/03/2014 1041   BUN 16 04/06/2017 0929   BUN 15 05/03/2014 1041   CREATININE 1.03 04/06/2017 0929   CREATININE 1.01 05/03/2014 1041      Component Value Date/Time   CALCIUM 9.0 04/06/2017 0929   CALCIUM 9.2 05/03/2014 1041   ALKPHOS 39 04/06/2017 0929   ALKPHOS 52 05/03/2014 1041   AST 24 04/06/2017 0929   AST 19 05/03/2014 1041   ALT 16 (L) 04/06/2017 0929   ALT 20 05/03/2014 1041   BILITOT 0.9 04/06/2017 0929   BILITOT 0.7 05/03/2014 1041       RADIOGRAPHIC STUDIES: IMPRESSION: 1. Moderate to severe circumferential soft tissue thickening and enhancement of the left retina/globe with similar crescent-shaped 1.8 cm retrobulbar enhancing soft tissue mass which appeared hypercellular on the recent brain MRI. No associated inflammation. Appearance highly suspicious for orbital malignancy such  as Lymphoma. 2. The left optic nerve appears atrophied. 3. Mechanical appearing reservoir type device along the left superior globe. 4. The right orbit and optic chiasm appear normal.   Electronically Signed   By: Genevie Ann M.D.   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 S/p RT [jan 15th 2019].  #Discussed the overall good prognosis of mucosa-associated lymphoma of the orbit; should respond well to radiation.  Discussed that this should not be life-threatening/or metastasize.  We will plan MRI in approximately 3 months  # Left eye discomfort/tearing-symptomatic treatment; improving followed by ophthalmology.   # ? Autoimmune- uveitis/scleritis/question rheumatoid arthritis- continue to follow up with Rheumatology.  # follow up in 3 months /MRI orbit few days prior.   Cc; Dr.Jaffe; [facility]     Cammie Sickle, MD 08/24/2017 10:45 AM

## 2017-09-13 ENCOUNTER — Ambulatory Visit: Payer: Managed Care, Other (non HMO) | Admitting: Radiation Oncology

## 2017-10-04 ENCOUNTER — Ambulatory Visit
Admission: RE | Admit: 2017-10-04 | Discharge: 2017-10-04 | Disposition: A | Discharge status: Home / Self Care | Payer: Managed Care, Other (non HMO) | Source: Ambulatory Visit | Attending: Radiation Oncology | Admitting: Radiation Oncology

## 2017-10-04 ENCOUNTER — Encounter: Payer: Self-pay | Admitting: Radiation Oncology

## 2017-10-04 ENCOUNTER — Other Ambulatory Visit: Payer: Self-pay

## 2017-10-04 VITALS — BP 106/66 | HR 61 | Temp 97.7°F | Resp 20 | Wt 169.3 lb

## 2017-10-04 DIAGNOSIS — C884 Extranodal marginal zone B-cell lymphoma of mucosa-associated lymphoid tissue [MALT-lymphoma]: Secondary | ICD-10-CM | POA: Diagnosis not present

## 2017-10-04 NOTE — Progress Notes (Signed)
Radiation Oncology Follow up Note  Name: Benjamin Clarke   Date:   10/04/2017 MRN:  [id number] DOB: [birth year]    This 57 y.o. male presents to the clinic today for one-month follow-up status post external beam radiation therapy to his left orbit for orbital MALT lymphoma.  REFERRING PROVIDER: Idelle Crouch, MD  HPI: Patient is a 57 year old male now one month out having completed radiation therapy to his left orbit for left orbital MALT lymphoma.Marland Kitchen He is seen today in routine follow-up and is doing well. He does have a cataract and pressure problems and that I preceding radiation and only has some shadowing vision in that eye. He specifically denies any new areas of adenopathy any irritation of the left orbit or dryness of the eye. He recently had an ultrasound in his ophthalmologist office which according to patient showed good results. He is scheduled for CT scan in about a month's time  COMPLICATIONS OF TREATMENT: none  FOLLOW UP COMPLIANCE: keeps appointments   PHYSICAL EXAM:  BP 106/66   Pulse 61   Temp 97.7 F (36.5 C)   Resp 20   Wt 169 lb 5 oz (76.8 kg)   BMI 22.34 kg/m  Obvious cataract of the left eye. No evidence of mass or nodularity in the left periorbital region left preauricular area sub-digastric cervical or supraclavicular nodes. Well-developed well-nourished patient in NAD. HEENT reveals PERLA, EOMI, discs not visualized.  Oral cavity is clear. No oral mucosal lesions are identified. Neck is clear without evidence of cervical or supraclavicular adenopathy. Lungs are clear to A&P. Cardiac examination is essentially unremarkable with regular rate and rhythm without murmur rub or thrill. Abdomen is benign with no organomegaly or masses noted. Motor sensory and DTR levels are equal and symmetric in the upper and lower extremities. Cranial nerves II through XII are grossly intact. Proprioception is intact. No peripheral adenopathy or edema is identified. No motor or  sensory levels are noted. Crude visual fields are within normal range.  RADIOLOGY RESULTS: No current films for review  PLAN: Present time patient is doing well from our standpoint. No residual side effects noted. A I've asked him to send you a copy of his CT scan when it is available. I've otherwise asked to see her back in 3-4 months for follow-up. Patient is to call anytime with any concerns.  I would like to take this opportunity to thank you for allowing me to participate in the care of your patient.Noreene Filbert, MD

## 2017-11-16 ENCOUNTER — Ambulatory Visit
Admission: RE | Admit: 2017-11-16 | Discharge: 2017-11-16 | Disposition: A | Discharge status: Home / Self Care | Payer: Managed Care, Other (non HMO) | Source: Ambulatory Visit | Attending: Internal Medicine | Admitting: Internal Medicine

## 2017-11-16 DIAGNOSIS — H4720 Unspecified optic atrophy: Secondary | ICD-10-CM | POA: Insufficient documentation

## 2017-11-16 DIAGNOSIS — C8599 Non-Hodgkin lymphoma, unspecified, extranodal and solid organ sites: Secondary | ICD-10-CM | POA: Diagnosis not present

## 2017-11-16 MED ORDER — GADOBENATE DIMEGLUMINE 529 MG/ML IV SOLN
15.0000 mL | Freq: Once | INTRAVENOUS | Status: AC | PRN
  Administered 2017-11-16: 15 mL via INTRAVENOUS

## 2017-11-23 ENCOUNTER — Inpatient Hospital Stay: Payer: Managed Care, Other (non HMO) | Attending: Internal Medicine | Admitting: Internal Medicine

## 2017-11-23 VITALS — BP 108/68 | HR 66 | Temp 97.5°F | Resp 16 | Wt 168.0 lb

## 2017-11-23 DIAGNOSIS — M069 Rheumatoid arthritis, unspecified: Secondary | ICD-10-CM | POA: Insufficient documentation

## 2017-11-23 DIAGNOSIS — Z79899 Other long term (current) drug therapy: Secondary | ICD-10-CM | POA: Insufficient documentation

## 2017-11-23 DIAGNOSIS — Z7982 Long term (current) use of aspirin: Secondary | ICD-10-CM | POA: Insufficient documentation

## 2017-11-23 DIAGNOSIS — Z800 Family history of malignant neoplasm of digestive organs: Secondary | ICD-10-CM | POA: Diagnosis not present

## 2017-11-23 DIAGNOSIS — Z87891 Personal history of nicotine dependence: Secondary | ICD-10-CM | POA: Diagnosis not present

## 2017-11-23 DIAGNOSIS — H209 Unspecified iridocyclitis: Secondary | ICD-10-CM | POA: Insufficient documentation

## 2017-11-23 DIAGNOSIS — Z923 Personal history of irradiation: Secondary | ICD-10-CM | POA: Diagnosis not present

## 2017-11-23 DIAGNOSIS — Z85038 Personal history of other malignant neoplasm of large intestine: Secondary | ICD-10-CM | POA: Insufficient documentation

## 2017-11-23 DIAGNOSIS — C8599 Non-Hodgkin lymphoma, unspecified, extranodal and solid organ sites: Secondary | ICD-10-CM

## 2017-11-23 DIAGNOSIS — I341 Nonrheumatic mitral (valve) prolapse: Secondary | ICD-10-CM | POA: Diagnosis not present

## 2017-11-23 DIAGNOSIS — Y842 Radiological procedure and radiotherapy as the cause of abnormal reaction of the patient, or of later complication, without mention of misadventure at the time of the procedure: Secondary | ICD-10-CM | POA: Insufficient documentation

## 2017-11-23 DIAGNOSIS — H5450 Low vision, one eye, unspecified eye: Secondary | ICD-10-CM | POA: Insufficient documentation

## 2017-11-23 DIAGNOSIS — H47292 Other optic atrophy, left eye: Secondary | ICD-10-CM | POA: Diagnosis not present

## 2017-11-23 NOTE — Progress Notes (Signed)
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 On Mxt [summer 2017]  # Lung nodule [incidental on CT]; last Nov 2014- 74m RML- STABLE [no further fu]     Colon carcinoma ([facility]   05/07/2015 Initial Diagnosis    Colon carcinoma (HCC)       Cancer of descending colon ([facility]   05/05/2016 Initial Diagnosis    Cancer of descending colon (HCC)       Orbital lymphoma ([facility]      INTERVAL HISTORY:  A very pleasant 57year old male patient with above history left orbital/ MALT lymphoma is here for follow-up; patient finished radiation approximately 3 months ago  Patient overall has been doing well except just in the last few days noted to have increasing tearing of his left eye/also complains of continued poor vision in his left eye. He denies any headaches. Denies any falls.  He denies any new lumps or bumps.  REVIEW OF SYSTEMS:  A complete 10 point review of system is done which is negative except mentioned above/history of present illness.   PAST MEDICAL HISTORY :  Past Medical History:  Diagnosis Date  . Abnormal heart rhythm   . Colon cancer ([facility]    sigmoid  . Hemorrhoid   . History of chemotherapy    FOLFOX  . Mitral valve prolapse    Pt reports Dr. GRockey Situtold him NO MVP  . Rheumatoid arthritis ([facility]     PAST SURGICAL HISTORY :   Past Surgical History:  Procedure Laterality Date  . AQUEOUS SHUNT Left 10/12/2016   Procedure: AQUEOUS SHUNT with scleral patch graft   Left;  Surgeon: ARonnell Freshwater MD;  [place] [facility]  Service: Ophthalmology;  Laterality: Left;  tube shunt  . BIOPSY Left 10/12/2016   Procedure: BIOPSY (fresh & permanent specimens sent from same location)- conjunctiva left eye;  Surgeon: ARonnell Freshwater MD;  [place] [facility]  Service: Ophthalmology;  Laterality: Left;  . COLON SURGERY    . COLONOSCOPY  2013, 05/16/2008  . ESOPHAGOGASTRODUODENOSCOPY  06/04/08  . PORT-A-CATH REMOVAL  2010  . PORTACATH PLACEMENT  06/2008    FAMILY HISTORY :   Family History  Problem Relation Age of Onset  . Arthritis Mother   . Heart murmur Father   . Colon cancer Maternal Grandfather   . Rheumatologic disease Maternal Aunt     SOCIAL HISTORY:   Social History   Tobacco Use  . Smoking status: Former Smoker    Packs/day: 1.00    Years: 24.00    Pack years: 24.00    Types: Cigarettes    Start date: 04/15/2008    Last attempt to quit: 07/27/2008    Years since quitting: 9.3  . Smokeless tobacco: Never Used  Substance Use Topics  . Alcohol use: Yes    Alcohol/week: 0.0 oz    Comment: occasional alcohol use  . Drug use: Yes    Comment: last used 1989    ALLERGIES:  is allergic  to thorazine [chlorpromazine].  MEDICATIONS:  Current Outpatient Medications  Medication Sig Dispense Refill  . brimonidine (ALPHAGAN P) 0.1 % SOLN Apply to eye.    . calcium carbonate (OS-CAL - DOSED IN MG OF ELEMENTAL CALCIUM) 1250 (500 Ca) MG tablet Take by mouth.    . cholecalciferol (VITAMIN D) 1000 UNITS tablet Take 1,000 Units by mouth every other day.      . Multiple Vitamins-Minerals (DAILY MULTI) TABS Take by mouth daily.      Marland Kitchen omeprazole (PRILOSEC) 40 MG capsule Take 40 mg by mouth daily.    . prednisoLONE acetate (PRED FORTE) 1 % ophthalmic suspension Place 1 drop into the left eye daily. X 14 days  0  . predniSONE (DELTASONE) 1 MG tablet 84m a day x 2 weeks, 8347ma day x 2 weeks, 47m66m day x 2 weeks, 6mg947m day x 2 weeks, 5mg 71may and stay (currently at 5 mg/day)    . folic acid (FOLVITE) 1 MG tablet TAKE 1 TABLET (1 MG TOTAL) BY MOUTH ONCE DAILY.  11   No current facility-administered medications for this visit.     PHYSICAL EXAMINATION: ECOG PERFORMANCE STATUS: 0 - Asymptomatic  BP 108/68   Pulse 66   Temp (!) 97.5 F (36.4 C) (Tympanic)   Resp 16   Wt 167 lb 15.9 oz (76.2 kg)   BMI 22.16 kg/m   Filed Weights   11/23/17 0949  Weight: 167 lb 15.9 oz (76.2 kg)    GENERAL: Well-nourished well-developed; Alert, no distress and comfortable.   Alone EYES: no pallor or icterus; positive for erythema and swelling of the left eye. OROPHARYNX: no thrush or ulceration; good dentition  NECK: supple, no masses felt LYMPH:  no palpable lymphadenopathy in the cervical, axillary or inguinal regions LUNGS: clear to auscultation and  No wheeze or crackles HEART/CVS: regular rate & rhythm and no murmurs; No lower extremity edema ABDOMEN:abdomen soft, non-tender and normal bowel sounds Musculoskeletal:no cyanosis of digits and no clubbing  PSYCH: alert & oriented x 3 with fluent speech NEURO: no focal motor/sensory deficits SKIN:  no rashes or significant lesions  LABORATORY DATA:  I have reviewed the data as listed    Component Value Date/Time   NA 137 04/06/2017 0929   NA 138 05/03/2014 1041   K 3.6 04/06/2017 0929   K 3.8 05/03/2014 1041   CL 101 04/06/2017 0929   CL 103 05/03/2014 1041   CO2 29 04/06/2017 0929   CO2 30 05/03/2014 1041   GLUCOSE 100 (H) 04/06/2017 0929   GLUCOSE 102 (H) 05/03/2014 1041   BUN 16 04/06/2017 0929   BUN 15 05/03/2014 1041   CREATININE 1.03 04/06/2017 0929   CREATININE 1.01 05/03/2014 1041   CALCIUM 9.0 04/06/2017 0929   CALCIUM 9.2 05/03/2014 1041   PROT 7.0 04/06/2017 0929   PROT 7.5 05/03/2014 1041   ALBUMIN 4.2 04/06/2017 0929   ALBUMIN 3.6 05/03/2014 1041   AST 24 04/06/2017 0929   AST 19 05/03/2014 1041   ALT 16 (L) 04/06/2017 0929    ALT 20 05/03/2014 1041   ALKPHOS 39 04/06/2017 0929   ALKPHOS 52 05/03/2014 1041   BILITOT 0.9 04/06/2017 0929   BILITOT 0.7 05/03/2014 1041   GFRNONAA >60 04/06/2017 0929   GFRNONAA >60 05/03/2014 1041   GFRNONAA >60 05/04/2013 0952   GFRAA >60 04/06/2017 0929   GFRAA >60 05/03/2014 1041   GFRAA >60 05/04/2013 0952    No results found for: SPEP,  UPEP  Lab Results  Component Value Date   WBC 7.5 08/02/2017   NEUTROABS 4.6 04/06/2017   HGB 14.7 08/02/2017   HCT 43.1 08/02/2017   MCV 91.1 08/02/2017   PLT 209 08/02/2017      Chemistry      Component Value Date/Time   NA 137 04/06/2017 0929   NA 138 05/03/2014 1041   K 3.6 04/06/2017 0929   K 3.8 05/03/2014 1041   CL 101 04/06/2017 0929   CL 103 05/03/2014 1041   CO2 29 04/06/2017 0929   CO2 30 05/03/2014 1041   BUN 16 04/06/2017 0929   BUN 15 05/03/2014 1041   CREATININE 1.03 04/06/2017 0929   CREATININE 1.01 05/03/2014 1041      Component Value Date/Time   CALCIUM 9.0 04/06/2017 0929   CALCIUM 9.2 05/03/2014 1041   ALKPHOS 39 04/06/2017 0929   ALKPHOS 52 05/03/2014 1041   AST 24 04/06/2017 0929   AST 19 05/03/2014 1041   ALT 16 (L) 04/06/2017 0929   ALT 20 05/03/2014 1041   BILITOT 0.9 04/06/2017 0929   BILITOT 0.7 05/03/2014 1041       RADIOGRAPHIC STUDIES: IMPRESSION: 1. Moderate to severe circumferential soft tissue thickening and enhancement of the left retina/globe with similar crescent-shaped 1.8 cm retrobulbar enhancing soft tissue mass which appeared hypercellular on the recent brain MRI. No associated inflammation. Appearance highly suspicious for orbital malignancy such as Lymphoma. 2. The left optic nerve appears atrophied. 3. Mechanical appearing reservoir type device along the left superior globe. 4. The right orbit and optic chiasm appear normal.   Electronically Signed   By: Genevie Ann M.D.   On: 04/27/2017 12:13  ------------------------------------------------------------     IMPRESSION: 1. Nearly resolved smooth circumferential enhancement of left globe retina with minimal residual. 2. Small crescent of enhancement posterior to left globe is markedly decreased in size from 04/27/2017 compatible with residual/sequelae of prior lymphoma. 3. Severe left optic nerve atrophy.   Electronically Signed   By: Kristine Garbe M.D.   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 S/p RT [jan 15th 2019].  MRI April 2019 shows-improvement of the posterior orbital enhancement/lymphoma ~currently up to 2 mm; also improvement of the orbital enhancement  #Discussed the overall good prognosis of mucosa-associated lymphoma of the orbit; most likely he should be cured.  Will recommend repeat MRI in 6 months from now  # Left eye discomfort/tearing-worsened question related to allergies/underlying uveitis/scleritis.  Unlikely from underlying lymphoma.  Recommend prednisolone eyedrops as per ophthalmology; appointment next week with ophthalmology at [facility].  Recommend to take the MRI scans/discs.   # ? Autoimmune- uveitis/scleritis/question rheumatoid arthritis- continue to follow up with Rheumatology.  # follow up in 6 months /MRI orbit few days prior.   # I reviewed the blood work- with the patient in detail; also reviewed the imaging independently [as summarized above]; and with the patient in detail.    Cc; Drs.Jaffe/Lylegold [facility]/ Dr.[facility].      Cammie Sickle, MD 11/23/2017 10:47 AM

## 2017-11-23 NOTE — Assessment & Plan Note (Addendum)
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 S/p RT [jan 15th 2019].  MRI April 2019 shows-improvement of the posterior orbital enhancement/lymphoma ~currently up to 2 mm; also improvement of the orbital enhancement  #Discussed the overall good prognosis of mucosa-associated lymphoma of the orbit; most likely he should be cured.  Will recommend repeat MRI in 6 months from now  # Left eye discomfort/tearing-worsened question related to allergies/underlying uveitis/scleritis.  Unlikely from underlying lymphoma.  Recommend prednisolone eyedrops as per ophthalmology; appointment next week with ophthalmology at [facility].  Recommend to take the MRI scans/discs.   # ? Autoimmune- uveitis/scleritis/question rheumatoid arthritis- continue to follow up with Rheumatology.  # follow up in 6 months /MRI orbit few days prior.   # I reviewed the blood work- with the patient in detail; also reviewed the imaging independently [as summarized above]; and with the patient in detail.    Cc; Drs.Jaffe/Lylegold [facility]/ Dr.[facility]ern[facility]le.

## 2017-12-01 DIAGNOSIS — H5213 Myopia, bilateral: Secondary | ICD-10-CM | POA: Insufficient documentation

## 2017-12-01 DIAGNOSIS — H25013 Cortical age-related cataract, bilateral: Secondary | ICD-10-CM | POA: Insufficient documentation

## 2017-12-01 DIAGNOSIS — C8581 Other specified types of non-Hodgkin lymphoma, lymph nodes of head, face, and neck: Secondary | ICD-10-CM | POA: Insufficient documentation

## 2018-02-28 ENCOUNTER — Encounter: Payer: Self-pay | Admitting: *Deleted

## 2018-03-01 ENCOUNTER — Ambulatory Visit
Admission: RE | Admit: 2018-03-01 | Discharge: 2018-03-01 | Disposition: A | Discharge status: Home / Self Care | Payer: Managed Care, Other (non HMO) | Source: Ambulatory Visit | Attending: Ophthalmology | Admitting: Ophthalmology

## 2018-03-01 ENCOUNTER — Encounter
Admission: RE | Disposition: A | Discharge status: Home / Self Care | Payer: Self-pay | Source: Ambulatory Visit | Attending: Ophthalmology

## 2018-03-01 ENCOUNTER — Ambulatory Visit: Payer: Managed Care, Other (non HMO) | Admitting: Certified Registered Nurse Anesthetist

## 2018-03-01 ENCOUNTER — Other Ambulatory Visit: Payer: Self-pay

## 2018-03-01 ENCOUNTER — Encounter: Payer: Self-pay | Admitting: Emergency Medicine

## 2018-03-01 DIAGNOSIS — I10 Essential (primary) hypertension: Secondary | ICD-10-CM | POA: Insufficient documentation

## 2018-03-01 DIAGNOSIS — Z888 Allergy status to other drugs, medicaments and biological substances status: Secondary | ICD-10-CM | POA: Insufficient documentation

## 2018-03-01 DIAGNOSIS — Z79899 Other long term (current) drug therapy: Secondary | ICD-10-CM | POA: Insufficient documentation

## 2018-03-01 DIAGNOSIS — Z85038 Personal history of other malignant neoplasm of large intestine: Secondary | ICD-10-CM | POA: Diagnosis not present

## 2018-03-01 DIAGNOSIS — M069 Rheumatoid arthritis, unspecified: Secondary | ICD-10-CM | POA: Diagnosis not present

## 2018-03-01 DIAGNOSIS — E1136 Type 2 diabetes mellitus with diabetic cataract: Secondary | ICD-10-CM | POA: Insufficient documentation

## 2018-03-01 DIAGNOSIS — Z87891 Personal history of nicotine dependence: Secondary | ICD-10-CM | POA: Insufficient documentation

## 2018-03-01 DIAGNOSIS — I4891 Unspecified atrial fibrillation: Secondary | ICD-10-CM | POA: Insufficient documentation

## 2018-03-01 DIAGNOSIS — J449 Chronic obstructive pulmonary disease, unspecified: Secondary | ICD-10-CM | POA: Diagnosis not present

## 2018-03-01 DIAGNOSIS — H2512 Age-related nuclear cataract, left eye: Secondary | ICD-10-CM | POA: Insufficient documentation

## 2018-03-01 HISTORY — PX: CATARACT EXTRACTION W/PHACO: SHX586

## 2018-03-01 HISTORY — DX: Chronic obstructive pulmonary disease, unspecified: J44.9

## 2018-03-01 HISTORY — DX: Cardiac arrhythmia, unspecified: I49.9

## 2018-03-01 HISTORY — DX: Solitary pulmonary nodule: R91.1

## 2018-03-01 SURGERY — PHACOEMULSIFICATION, CATARACT, WITH IOL INSERTION
Anesthesia: Monitor Anesthesia Care | Site: Eye | Laterality: Left | Wound class: "Clean "

## 2018-03-01 MED ORDER — TETRACAINE HCL 0.5 % OP SOLN
1.0000 [drp] | Freq: Once | OPHTHALMIC | Status: AC
  Administered 2018-03-01: 1 [drp] via OPHTHALMIC

## 2018-03-01 MED ORDER — PHENYLEPHRINE HCL 10 % OP SOLN
OPHTHALMIC | Status: AC
  Administered 2018-03-01: 1 [drp] via OPHTHALMIC
  Filled 2018-03-01: qty 5

## 2018-03-01 MED ORDER — POVIDONE-IODINE 5 % OP SOLN
OPHTHALMIC | Status: DC | PRN
  Administered 2018-03-01: 1 via OPHTHALMIC

## 2018-03-01 MED ORDER — NA CHONDROIT SULF-NA HYALURON 40-17 MG/ML IO SOLN
INTRAOCULAR | Status: AC
  Filled 2018-03-01: qty 1

## 2018-03-01 MED ORDER — PHENYLEPHRINE HCL 10 % OP SOLN
1.0000 [drp] | OPHTHALMIC | Status: AC
  Administered 2018-03-01 (×4): 1 [drp] via OPHTHALMIC

## 2018-03-01 MED ORDER — CARBACHOL 0.01 % IO SOLN
INTRAOCULAR | Status: DC | PRN
  Administered 2018-03-01: 0.5 mL via INTRAOCULAR

## 2018-03-01 MED ORDER — TRYPAN BLUE 0.06 % OP SOLN
OPHTHALMIC | Status: DC | PRN
  Administered 2018-03-01: 0.5 mL via INTRAOCULAR

## 2018-03-01 MED ORDER — CYCLOPENTOLATE HCL 2 % OP SOLN
OPHTHALMIC | Status: AC
  Administered 2018-03-01: 1 [drp] via OPHTHALMIC
  Filled 2018-03-01: qty 2

## 2018-03-01 MED ORDER — MOXIFLOXACIN HCL 0.5 % OP SOLN
OPHTHALMIC | Status: AC
  Filled 2018-03-01: qty 3

## 2018-03-01 MED ORDER — EPINEPHRINE PF 1 MG/ML IJ SOLN
INTRAOCULAR | Status: DC | PRN
  Administered 2018-03-01: 11:00:00 via OPHTHALMIC

## 2018-03-01 MED ORDER — CYCLOPENTOLATE HCL 2 % OP SOLN
1.0000 [drp] | OPHTHALMIC | Status: AC
  Administered 2018-03-01 (×4): 1 [drp] via OPHTHALMIC

## 2018-03-01 MED ORDER — SODIUM CHLORIDE 0.9 % IV SOLN
INTRAVENOUS | Status: DC
  Administered 2018-03-01: 11:00:00 via INTRAVENOUS

## 2018-03-01 MED ORDER — EPINEPHRINE PF 1 MG/ML IJ SOLN
INTRAMUSCULAR | Status: AC
  Filled 2018-03-01: qty 2

## 2018-03-01 MED ORDER — LIDOCAINE HCL (PF) 4 % IJ SOLN
INTRAMUSCULAR | Status: AC
  Filled 2018-03-01: qty 5

## 2018-03-01 MED ORDER — MOXIFLOXACIN HCL 0.5 % OP SOLN
OPHTHALMIC | Status: DC | PRN
  Administered 2018-03-01: 0.2 mL via OPHTHALMIC

## 2018-03-01 MED ORDER — NA CHONDROIT SULF-NA HYALURON 40-17 MG/ML IO SOLN
INTRAOCULAR | Status: DC | PRN
  Administered 2018-03-01: 1 mL via INTRAOCULAR

## 2018-03-01 MED ORDER — ONDANSETRON HCL 4 MG/2ML IJ SOLN: 4.0000 mg | Freq: Once | INTRAMUSCULAR | Status: DC | PRN

## 2018-03-01 MED ORDER — FENTANYL CITRATE (PF) 100 MCG/2ML IJ SOLN: 25.0000 ug | INTRAMUSCULAR | Status: DC | PRN

## 2018-03-01 MED ORDER — MOXIFLOXACIN HCL 0.5 % OP SOLN: 1.0000 [drp] | OPHTHALMIC | Status: DC | PRN

## 2018-03-01 MED ORDER — POVIDONE-IODINE 5 % OP SOLN
OPHTHALMIC | Status: AC
  Filled 2018-03-01: qty 30

## 2018-03-01 MED ORDER — MIDAZOLAM HCL 2 MG/2ML IJ SOLN
INTRAMUSCULAR | Status: AC
  Filled 2018-03-01: qty 2

## 2018-03-01 MED ORDER — TETRACAINE HCL 0.5 % OP SOLN
OPHTHALMIC | Status: AC
  Administered 2018-03-01: 11:00:00
  Filled 2018-03-01: qty 4

## 2018-03-01 MED ORDER — MIDAZOLAM HCL 2 MG/2ML IJ SOLN
INTRAMUSCULAR | Status: DC | PRN
  Administered 2018-03-01 (×2): 1 mg via INTRAVENOUS

## 2018-03-01 MED ORDER — LIDOCAINE HCL (PF) 4 % IJ SOLN
INTRAOCULAR | Status: DC | PRN
  Administered 2018-03-01: 4 mL via OPHTHALMIC

## 2018-03-01 SURGICAL SUPPLY — 19 items
CANNULA ANT/CHMB 27G (MISCELLANEOUS) IMPLANT
CANNULA ANT/CHMB 27GA (MISCELLANEOUS) ×2 IMPLANT
GLOVE BIO SURGEON STRL SZ8 (GLOVE) ×2 IMPLANT
GLOVE BIOGEL M 6.5 STRL (GLOVE) ×2 IMPLANT
GLOVE SURG LX 8.0 MICRO (GLOVE) ×1
GLOVE SURG LX STRL 8.0 MICRO (GLOVE) ×1 IMPLANT
GOWN STRL REUS W/ TWL LRG LVL3 (GOWN DISPOSABLE) ×2 IMPLANT
GOWN STRL REUS W/TWL LRG LVL3 (GOWN DISPOSABLE) ×2
LABEL CATARACT MEDS ST (LABEL) ×2 IMPLANT
LENS IOL TECNIS ITEC 13.0 (Intraocular Lens) ×1 IMPLANT
PACK CATARACT (MISCELLANEOUS) ×2 IMPLANT
PACK CATARACT BRASINGTON LX (MISCELLANEOUS) ×2 IMPLANT
PACK EYE AFTER SURG (MISCELLANEOUS) ×2 IMPLANT
RING MALYGIN (MISCELLANEOUS) ×1 IMPLANT
SOL BSS BAG (MISCELLANEOUS) ×2
SOLUTION BSS BAG (MISCELLANEOUS) ×1 IMPLANT
SYR 5ML LL (SYRINGE) ×2 IMPLANT
WATER STERILE IRR 250ML POUR (IV SOLUTION) ×2 IMPLANT
WIPE NON LINTING 3.25X3.25 (MISCELLANEOUS) ×2 IMPLANT

## 2018-03-01 NOTE — Op Note (Signed)
PREOPERATIVE DIAGNOSIS:  Nuclear sclerotic cataract of the left eye.   POSTOPERATIVE DIAGNOSIS:  Nuclear sclerotic cataract of the left eye.   OPERATIVE PROCEDURE: Procedure(s): CATARACT EXTRACTION PHACO AND INTRAOCULAR LENS PLACEMENT (IOC)   SURGEON:  Birder Robson, MD.   ANESTHESIA:  Anesthesiologist: Molli Barrows, MD CRNA: Eben Burow, CRNA  1.      Managed anesthesia care. 2.     0.79ml of Shugarcaine was instilled following the paracentesis   COMPLICATIONS: Viscoelastic was used to raise the pupil margin.  A  Malyugin ring was placed as the pupil would not achieve sufficient pharmacologic dilation to undergo cataract extraction safely.( The ring was removed atraumatically following insertion of the IOL.)     Vision Blue was used to stain the anterior capsule due to very poor/ no visualization of the red reflex.     TECHNIQUE:   Stop and chop   DESCRIPTION OF PROCEDURE:  The patient was examined and consented in the preoperative holding area where the aforementioned topical anesthesia was applied to the left eye and then brought back to the Operating Room where the left eye was prepped and draped in the usual sterile ophthalmic fashion and a lid speculum was placed. A paracentesis was created with the side port blade and the anterior chamber was filled with viscoelastic. A near clear corneal incision was performed with the steel keratome. A continuous curvilinear capsulorrhexis was performed with a cystotome followed by the capsulorrhexis forceps. Hydrodissection and hydrodelineation were carried out with BSS on a blunt cannula. The lens was removed in a stop and chop  technique and the remaining cortical material was removed with the irrigation-aspiration handpiece. The capsular bag was inflated with viscoelastic and the Technis [id number] lens was placed in the capsular bag without complication. The remaining viscoelastic was removed from the eye with the irrigation-aspiration  handpiece. The wounds were hydrated. The anterior chamber was flushed with Miostat and the eye was inflated to physiologic pressure. 0.59ml Vigamox was placed in the anterior chamber. The wounds were found to be water tight. The eye was dressed with Vigamox. The patient was given protective glasses to wear throughout the day and a shield with which to sleep tonight. The patient was also given drops with which to begin a drop regimen today and will follow-up with me in one day. Implant Name Type Inv. Item Serial No. Manufacturer Lot No. LRB No. Used  LENS IOL DIOP 13.0 - [id number] Intraocular Lens LENS IOL DIOP 13.0 [id number]  Left 1    Procedure(s) with comments: CATARACT EXTRACTION PHACO AND INTRAOCULAR LENS PLACEMENT (IOC) (Left) - [place] 01:49.2 AP% 18.6 CDE 20.32 Fluid Pack Lot # [id number]  Electronically signed: Birder Robson 03/01/2018 11:56 AM

## 2018-03-01 NOTE — Discharge Instructions (Signed)
Eye Surgery Discharge Instructions    Expect mild scratchy sensation or mild soreness. DO NOT RUB YOUR EYE!  The day of surgery:  Minimal physical activity, but bed rest is not required  No reading, computer work, or close hand work  No bending, lifting, or straining.  May watch TV  For 24 hours:  No driving, legal decisions, or alcoholic beverages  Safety precautions  Eat anything you prefer: It is better to start with liquids, then soup then solid foods.  _____ Eye patch should be worn until postoperative exam tomorrow.  ____ Solar shield eyeglasses should be worn for comfort in the sunlight/patch while sleeping  Resume all regular medications including aspirin or Coumadin if these were discontinued prior to surgery. You may shower, bathe, shave, or wash your hair. Tylenol may be taken for mild discomfort.  Call your doctor if you experience significant pain, nausea, or vomiting, fever > 101 or other signs of infection. [phone] or [phone] Specific instructions:  Follow-up Information    Birder Robson, MD Follow up.   Specialty:  Ophthalmology Why:  August 7 at 8:50am Contact information: [address] [place] [address] [phone]

## 2018-03-01 NOTE — Anesthesia Postprocedure Evaluation (Signed)
Anesthesia Post Note  Patient: Benjamin Clarke  Procedure(s) Performed: CATARACT EXTRACTION PHACO AND INTRAOCULAR LENS PLACEMENT (IOC) (Left Eye)  Patient location during evaluation: Short Stay Anesthesia Type: MAC Level of consciousness: awake and alert, patient cooperative and oriented Pain management: satisfactory to patient Vital Signs Assessment: post-procedure vital signs reviewed and stable Respiratory status: spontaneous breathing, nonlabored ventilation and respiratory function stable Cardiovascular status: blood pressure returned to baseline Postop Assessment: no headache and no apparent nausea or vomiting Anesthetic complications: no     Last Vitals:  Vitals:   03/01/18 1038 03/01/18 1158  BP: 108/76 105/70  Pulse: (!) 52 (!) 56  Resp: 17 16  Temp: 36.9 C 36.4 C  SpO2: 98% 98%    Last Pain:  Vitals:   03/01/18 1158  TempSrc:   PainSc: 0-No pain                 Eben Burow

## 2018-03-01 NOTE — Anesthesia Post-op Follow-up Note (Signed)
Anesthesia QCDR form completed.        

## 2018-03-01 NOTE — Transfer of Care (Signed)
Immediate Anesthesia Transfer of Care Note  Patient: Benjamin Clarke  Procedure(s) Performed: CATARACT EXTRACTION PHACO AND INTRAOCULAR LENS PLACEMENT (IOC) (Left Eye)  Patient Location: Short Stay  Anesthesia Type:MAC  Level of Consciousness: awake, alert , oriented and patient cooperative  Airway & Oxygen Therapy: Patient Spontanous Breathing  Post-op Assessment: Report given to RN and Post -op Vital signs reviewed and stable  Post vital signs: Reviewed and stable  Last Vitals:  Vitals Value Taken Time  BP    Temp    Pulse    Resp    SpO2      Last Pain:  Vitals:   03/01/18 1038  TempSrc: Tympanic  PainSc: 0-No pain         Complications: No apparent anesthesia complications

## 2018-03-01 NOTE — H&P (Signed)
All labs reviewed. Abnormal studies sent to patients PCP when indicated.  Previous H&P reviewed, patient examined, there are NO CHANGES.  Benjamin Carton Porfilio8/6/201911:19 AM

## 2018-03-01 NOTE — Anesthesia Preprocedure Evaluation (Signed)
Anesthesia Evaluation  Patient identified by MRN, date of birth, ID band Patient awake    Reviewed: Allergy & Precautions, H&P , NPO status , Patient's Chart, lab work & pertinent test results, reviewed documented beta blocker date and time   Airway Mallampati: II  TM Distance: >3 FB Neck ROM: full    Dental no notable dental hx. (+) Teeth Intact   Pulmonary neg pulmonary ROS, COPD,  COPD inhaler, former smoker,    Pulmonary exam normal breath sounds clear to auscultation       Cardiovascular Exercise Tolerance: Good hypertension, negative cardio ROS  + dysrhythmias Atrial Fibrillation  Rhythm:regular Rate:Normal     Neuro/Psych negative neurological ROS  negative psych ROS   GI/Hepatic negative GI ROS, Neg liver ROS,   Endo/Other  negative endocrine ROSdiabetes  Renal/GU      Musculoskeletal   Abdominal   Peds  Hematology negative hematology ROS (+)   Anesthesia Other Findings   Reproductive/Obstetrics negative OB ROS                             Anesthesia Physical Anesthesia Plan  ASA: II  Anesthesia Plan: MAC   Post-op Pain Management:    Induction:   PONV Risk Score and Plan:   Airway Management Planned:   Additional Equipment:   Intra-op Plan:   Post-operative Plan:   Informed Consent: I have reviewed the patients History and Physical, chart, labs and discussed the procedure including the risks, benefits and alternatives for the proposed anesthesia with the patient or authorized representative who has indicated his/her understanding and acceptance.     Plan Discussed with: CRNA  Anesthesia Plan Comments:         Anesthesia Quick Evaluation

## 2018-03-07 ENCOUNTER — Ambulatory Visit: Payer: Managed Care, Other (non HMO) | Admitting: Radiation Oncology

## 2018-03-16 ENCOUNTER — Telehealth: Payer: Self-pay | Admitting: Cardiovascular Disease

## 2018-03-16 NOTE — Telephone Encounter (Signed)
Call returned to the patient. He stated that he has been having intermittent chest pains that have lasted a few seconds to a minute. The pain is in the left chest area.He has not been doing anything exertional when the pain occurs. He has continued to take his brisk walks daily without any pain or shortness of breath.   He denies shortness of breath and prolonged pain. He has been advised to go to the ED if these symptoms occurs.   He stated that he has been taking prednisone since his cataract surgery 2 weeks ago and wonders if this could be causing an adverse effect on his heart. He has been offered an appointment but would like to wait and hear back from Dr. Rockey Situ for his advice. He has not been seen in over 5 years and would be considered a new patient.

## 2018-03-16 NOTE — Telephone Encounter (Signed)
Pt c/o of Chest Pain: STAT if CP now or developed within 24 hours  1. Are you having CP right now? no  2. Are you experiencing any other symptoms (ex. SOB, nausea, vomiting, sweating)? Dull ache. Left side toward left breast  3. How long have you been experiencing CP? Within the last 48 hours  4. Is your CP continuous or coming and going? Comes and goes  5. Have you taken Nitroglycerin? no ?

## 2018-03-18 NOTE — Telephone Encounter (Signed)
Last evaluated 08/2009 He would appear that a lot has happened since that time I cannot determine what is going on with a phone call message after such a long period of time Would recommend he come in for appointment I cannot guide him in one direction or another at this time

## 2018-03-18 NOTE — Telephone Encounter (Signed)
Call returned to the patient. He stated that he has felt good and would call back if he felt he needed an appointment. He declined to be seen stating that his symptoms were gone for now. He has been advised to go to the ED if the symptoms return.

## 2018-03-21 ENCOUNTER — Ambulatory Visit: Payer: Managed Care, Other (non HMO) | Admitting: Radiation Oncology

## 2018-03-29 IMAGING — MR MR ORBITS WO/W CM
7 series · 39 of 48 positions shown · IV contrast (multihance)
Comparison: Brain MRI 04/05/2017

CLINICAL DATA: 56-year-old male with abnormal left orbit appearance
on brain MRI 04/05/2017.
Being considered for an orbital biopsy.

Personal history of left eye glaucoma, posterior scleritis on the
left eye.
Personal history of colon cancer.
EXAM:
MRI OF THE ORBITS WITHOUT AND WITH CONTRAST
TECHNIQUE: Multiplanar, multisequence MR imaging of the orbits was performed
both before and after the administration of intravenous contrast.
CONTRAST:  15mL MULTIHANCE GADOBENATE DIMEGLUMINE 529 MG/ML IV SOLN

[Series 2: T1 · sagittal · 5.0mm · 0.90mm/px · 6 of 24 slices shown (1 of 3)]
[im 1/24]
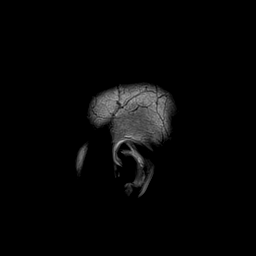
[im 5/24]
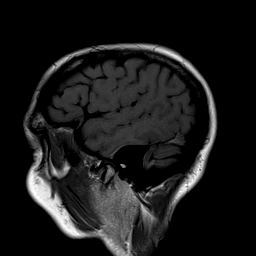
[im 10/24]
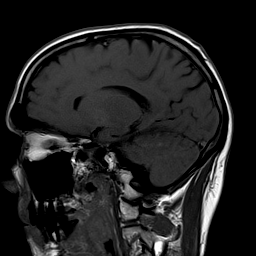
[im 14/24]
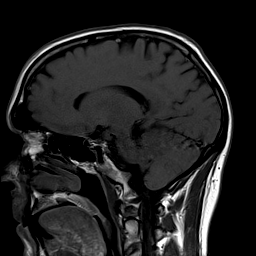
[im 19/24]
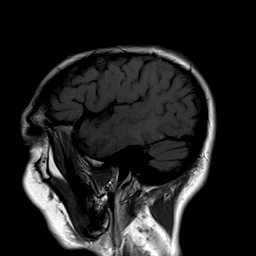
[im 24/24]
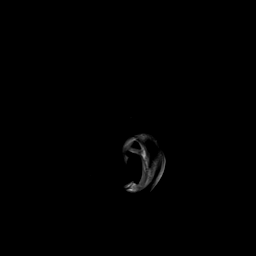

[Series 3: T2 fat-sat · axial · 3.0mm · 0.70mm/px · z∈[-35,+24]mm · 5 of 21 slices shown (1 of 2)]
[im 1/21]
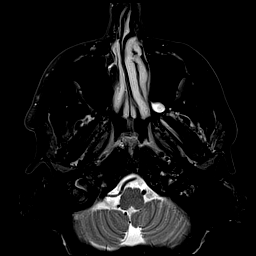
[im 6/21]
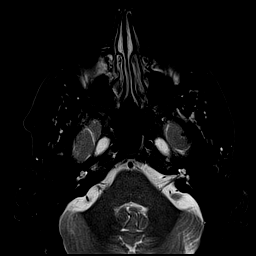
[im 11/21]
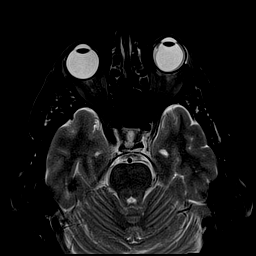
[im 16/21]
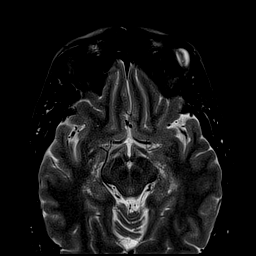
[im 21/21]
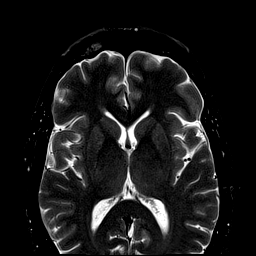

[Series 4: T1 · axial · non-contrast · 3.0mm · 0.70mm/px · z∈[-35,+24]mm · 5 of 21 slices shown (2 of 3)]
[im 1/21]
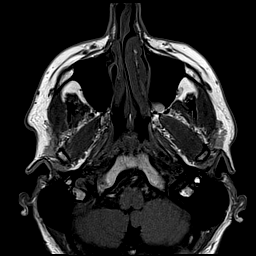
[im 6/21]
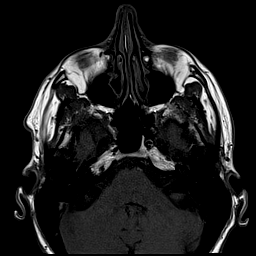
[im 11/21]
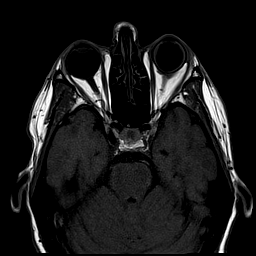
[im 16/21]
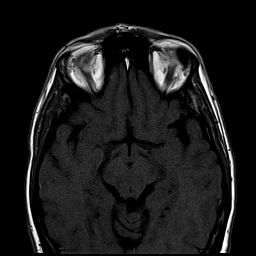
[im 21/21]
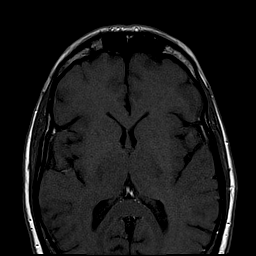

[Series 5: T2 fat-sat · coronal · 3.0mm · 0.35mm/px · 9 of 36 slices shown (2 of 2)]
[im 1/36]
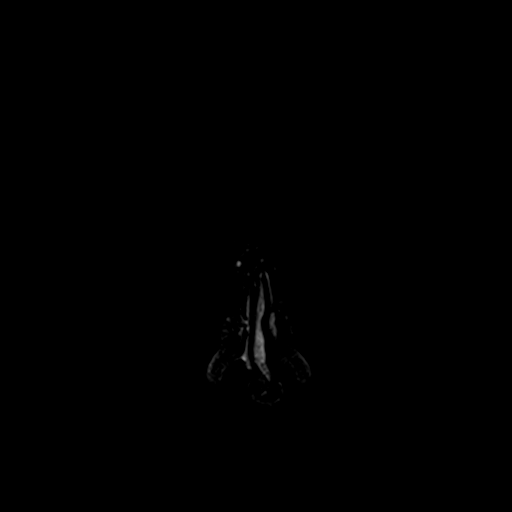
[im 5/36]
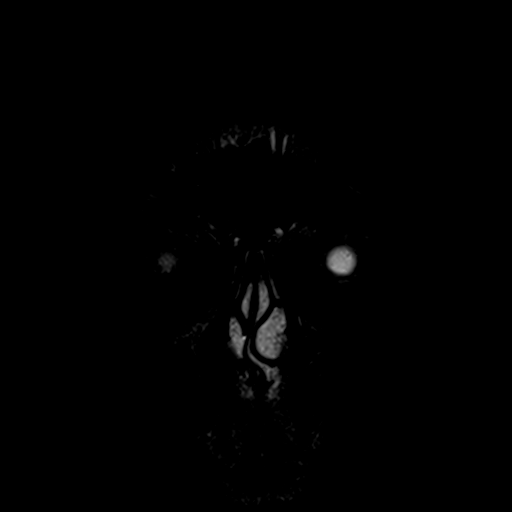
[im 9/36]
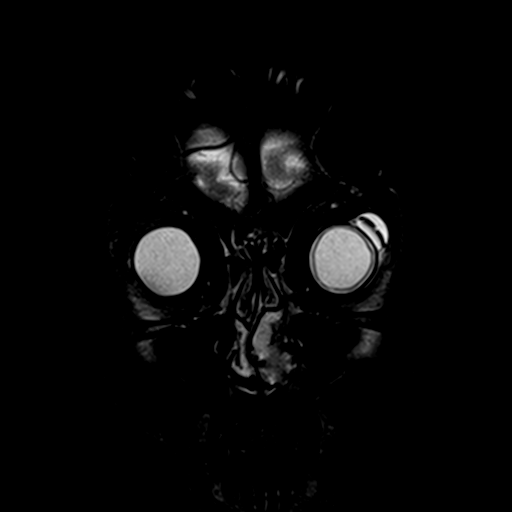
[im 14/36]
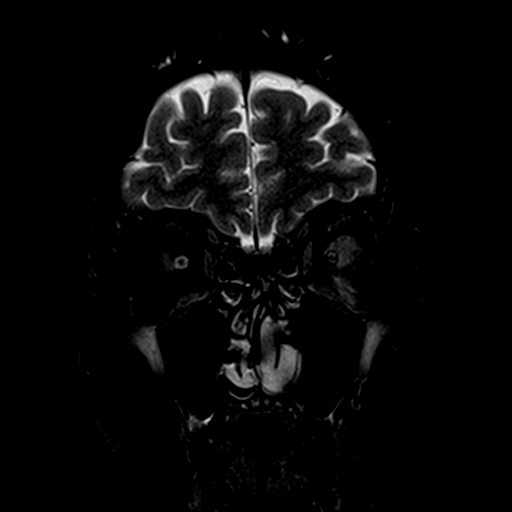
[im 18/36]
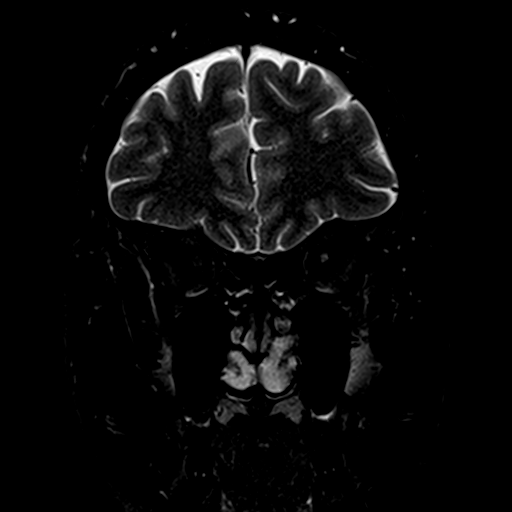
[im 22/36]
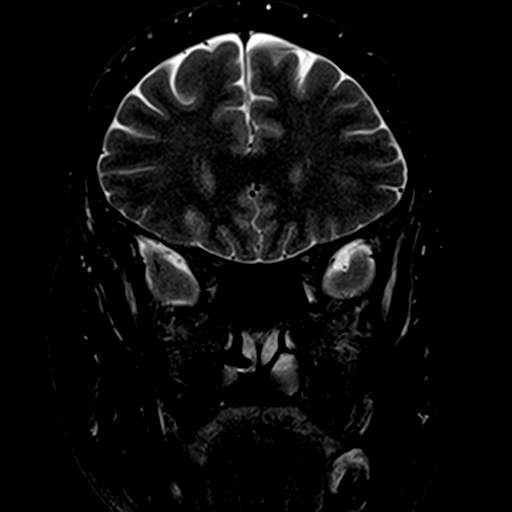
[im 27/36]
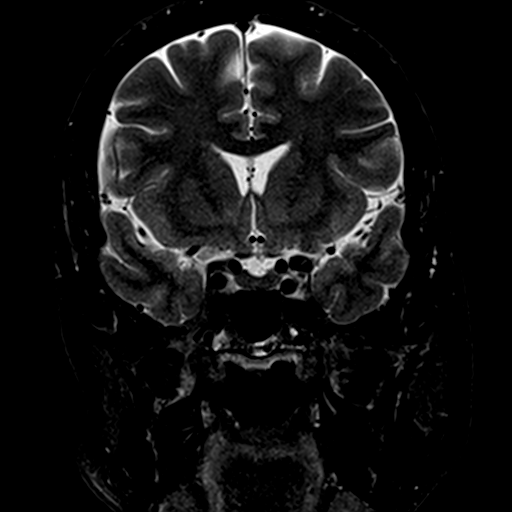
[im 31/36]
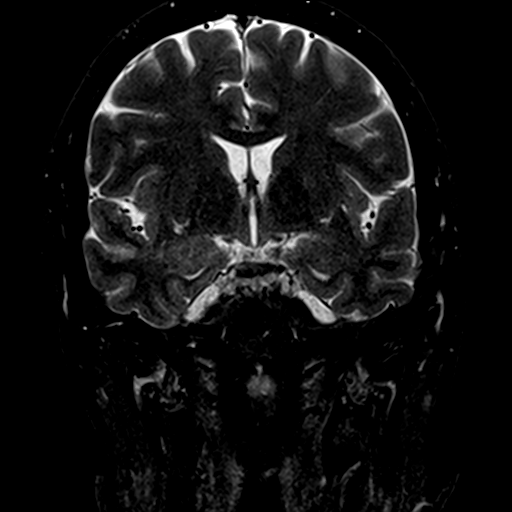
[im 36/36]
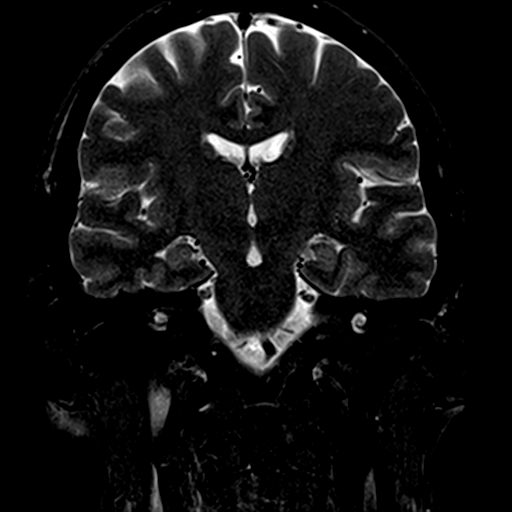

[Series 6: T1 · coronal · non-contrast · 3.0mm · 0.70mm/px · 8 of 36 slices shown (3 of 3)]
[im 1/36]
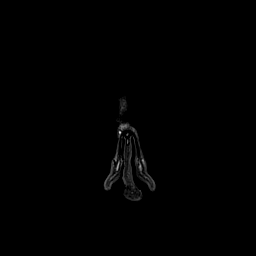
[im 5/36]
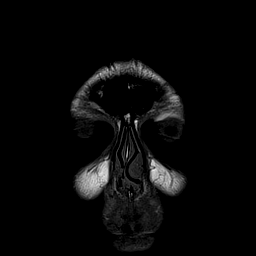
[im 9/36]
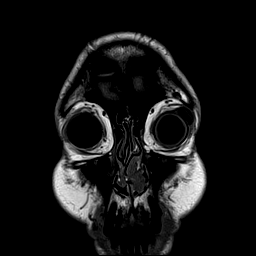
[im 14/36]
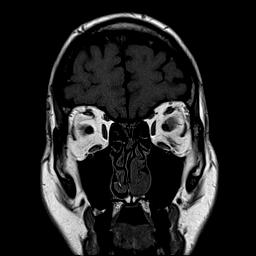
[im 22/36]
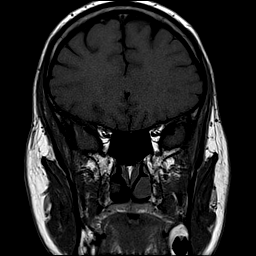
[im 27/36]
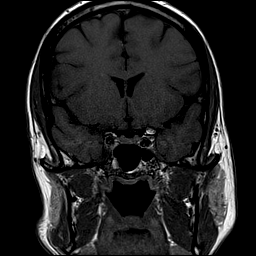
[im 31/36]
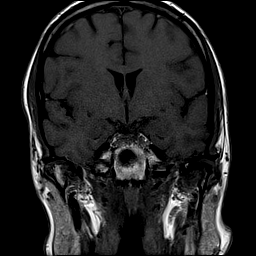
[im 36/36]
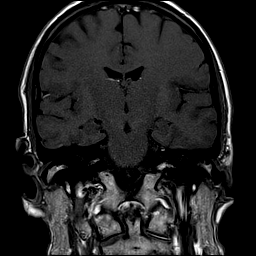

[Series 7: T1 fat-sat post-contrast · axial · 3.0mm · 0.70mm/px · z∈[-35,+24]mm · 5 of 21 slices shown (1 of 2)]
[im 1/21]
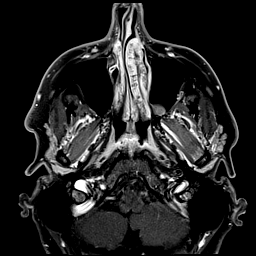
[im 6/21]
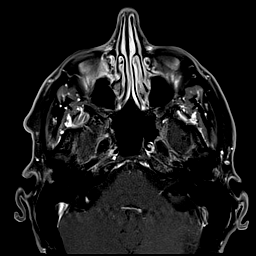
[im 11/21]
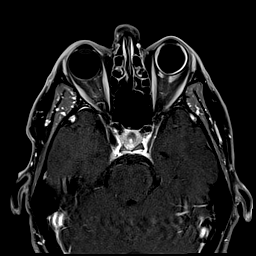
[im 16/21]
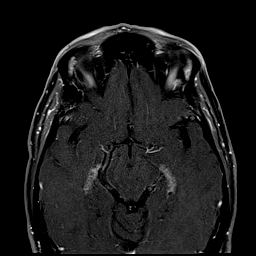
[im 21/21]
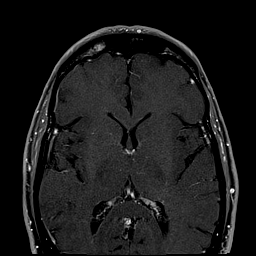

[Series 8: T1 fat-sat post-contrast · coronal · 3.0mm · 0.70mm/px · 1 of 36 slices shown (2 of 2)]
[im 1/36]
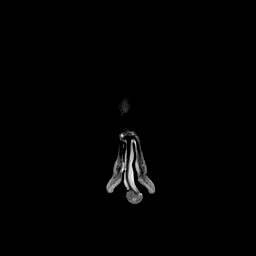

[39 of 48 positions shown; findings below may reference images not displayed]

FINDINGS: Moderate to severe circumferential thickening and enhancement of the
left retina, measuring 3-4 mm and associated with low to
intermediate signal on T2 weighted images. See series 7, image 11
and series 3, image 11.

Furthermore there is associated confluent crescent-shaped abnormal
enhancement with similar intermediate T2 signal posterior to the
left globe, encompassing 5 x 18 x 13 mm (AP by transverse by CC) and
surrounding the nonenhancing left optic nerve (series 8, image 23).
Comparison brain diffusion images suggested corresponding
hypercellularity in this same lesion.

No associated inflammatory fat stranding within the orbit. The left
extraocular muscles and optic nerve appear to remain normal.

The left optic nerve is nonenlarged and nonenhancing but does
demonstrate abnormal asymmetric T2 hyperintensity throughout its
course (e.g. Series 5, image 20).

Superimposed mechanical appearing reservoir like device along the
left superolateral globe occupying much of the expected location of
the lacrimal gland (series 5, image 28).

The contralateral right globe and orbit soft tissues are normal. The
right optic nerve and optic chiasm are normal. Normal suprasellar
cistern, pituitary and cavernous sinus. Visible optic radiations are
normal. No dural thickening or abnormal intracranial enhancement
identified.

The superficial left periorbital soft tissues appear normal.

Visualized paranasal sinuses and mastoids are stable and well
pneumatized. There is a proteinaceous left maxillary sinus retention
cyst along the medial wall. Major intracranial vascular flow voids
are preserved. Normal bone marrow signal in the skull and visible
cervical spine.
IMPRESSION: 1. Moderate to severe circumferential soft tissue thickening and
enhancement of the left retina/globe with similar crescent-shaped
1.8 cm retrobulbar enhancing soft tissue mass which appeared
hypercellular on the recent brain MRI. No associated inflammation.
Appearance highly suspicious for orbital malignancy such as
Lymphoma.
2. The left optic nerve appears atrophied.
3. Mechanical appearing reservoir type device along the left
superior globe.
4. The right orbit and optic chiasm appear normal.

## 2018-04-01 ENCOUNTER — Encounter: Payer: Self-pay | Admitting: Radiation Oncology

## 2018-04-01 ENCOUNTER — Other Ambulatory Visit: Payer: Self-pay

## 2018-04-01 ENCOUNTER — Ambulatory Visit
Admission: RE | Admit: 2018-04-01 | Discharge: 2018-04-01 | Disposition: A | Discharge status: Home / Self Care | Payer: Managed Care, Other (non HMO) | Source: Ambulatory Visit | Attending: Radiation Oncology | Admitting: Radiation Oncology

## 2018-04-01 VITALS — BP 107/66 | HR 61 | Temp 97.1°F | Resp 18 | Wt 166.2 lb

## 2018-04-01 DIAGNOSIS — Z923 Personal history of irradiation: Secondary | ICD-10-CM | POA: Insufficient documentation

## 2018-04-01 DIAGNOSIS — C884 Extranodal marginal zone B-cell lymphoma of mucosa-associated lymphoid tissue [MALT-lymphoma]: Secondary | ICD-10-CM | POA: Insufficient documentation

## 2018-04-01 NOTE — Progress Notes (Signed)
Radiation Oncology Follow up Note  Name: Benjamin Clarke   Date:   04/01/2018 MRN:  [id number] DOB: Mar 25, 1961    This 57 y.o. 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 Seen today in routine follow-up he is doing fairly well. He has had cataract surgery.he recently had an MRI scan of his orbits back in March showing nearly resolved smooth circumflex marginal enhancement of the left globe with a only a small crescent of enhancement posterior left lobe which is decreased in size from October 2018. He has also severe left optic nerve atrophy. He has another MRI of his orbits plans for this October.  COMPLICATIONS OF TREATMENT: none  FOLLOW UP COMPLIANCE: keeps appointments   PHYSICAL EXAM:  BP 107/66 (BP Location: Left Arm, Patient Position: Sitting)   Pulse 61   Temp (!) 97.1 F (36.2 C) (Tympanic)   Resp 18   Wt 166 lb 3.6 oz (75.4 kg)   BMI 21.93 kg/m  Crude visual fields are within normal range. No evidence of preauricular sub-digastric or cervical adenopathy is identified.Well-developed well-nourished patient in NAD. HEENT reveals PERLA, EOMI, discs not visualized.  Oral cavity is clear. No oral mucosal lesions are identified. Neck is clear without evidence of cervical or supraclavicular adenopathy. Lungs are clear to A&P. Cardiac examination is essentially unremarkable with regular rate and rhythm without murmur rub or thrill. Abdomen is benign with no organomegaly or masses noted. Motor sensory and DTR levels are equal and symmetric in the upper and lower extremities. Cranial nerves II through XII are grossly intact. Proprioception is intact. No peripheral adenopathy or edema is identified. No motor  or sensory levels are noted. Crude visual fields are within normal range.  RADIOLOGY RESULTS: MRI scan reviewed and compatible with the above-stated findings  PLAN: at this time patient is doing well. I have reviewed his MRI scan believe everything is stable will continue to follow the area of slight enhancement. I will review his MRI scan which is being performed in October. I've asked to see him back in 6 months for follow-up. He continues close follow-up care with medical oncology.  I would like to take this opportunity to thank you for allowing me to participate in the care of your patient.Noreene Filbert, MD

## 2018-05-19 ENCOUNTER — Ambulatory Visit
Admission: RE | Admit: 2018-05-19 | Discharge: 2018-05-19 | Disposition: A | Discharge status: Home / Self Care | Payer: Managed Care, Other (non HMO) | Source: Ambulatory Visit | Attending: Internal Medicine | Admitting: Internal Medicine

## 2018-05-19 DIAGNOSIS — C8599 Non-Hodgkin lymphoma, unspecified, extranodal and solid organ sites: Secondary | ICD-10-CM | POA: Insufficient documentation

## 2018-05-19 MED ORDER — GADOBENATE DIMEGLUMINE 529 MG/ML IV SOLN
15.0000 mL | Freq: Once | INTRAVENOUS | Status: AC | PRN
  Administered 2018-05-19: 15 mL via INTRAVENOUS

## 2018-05-24 ENCOUNTER — Inpatient Hospital Stay: Payer: Managed Care, Other (non HMO) | Attending: Internal Medicine | Admitting: Internal Medicine

## 2018-05-24 VITALS — BP 104/66 | HR 57 | Temp 98.1°F | Resp 16 | Wt 167.1 lb

## 2018-05-24 DIAGNOSIS — H209 Unspecified iridocyclitis: Secondary | ICD-10-CM

## 2018-05-24 DIAGNOSIS — C884 Extranodal marginal zone B-cell lymphoma of mucosa-associated lymphoid tissue [MALT-lymphoma]: Secondary | ICD-10-CM | POA: Insufficient documentation

## 2018-05-24 DIAGNOSIS — Z800 Family history of malignant neoplasm of digestive organs: Secondary | ICD-10-CM | POA: Diagnosis not present

## 2018-05-24 DIAGNOSIS — Z85038 Personal history of other malignant neoplasm of large intestine: Secondary | ICD-10-CM | POA: Diagnosis not present

## 2018-05-24 DIAGNOSIS — C186 Malignant neoplasm of descending colon: Secondary | ICD-10-CM

## 2018-05-24 DIAGNOSIS — Z923 Personal history of irradiation: Secondary | ICD-10-CM | POA: Diagnosis not present

## 2018-05-24 DIAGNOSIS — Z87891 Personal history of nicotine dependence: Secondary | ICD-10-CM | POA: Diagnosis not present

## 2018-05-24 DIAGNOSIS — C8599 Non-Hodgkin lymphoma, unspecified, extranodal and solid organ sites: Secondary | ICD-10-CM

## 2018-05-24 NOTE — Assessment & Plan Note (Addendum)
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 S/p RT [jan 15th 2019]. MRI Oct 2019-improvement of the posterior orbital enhancement/lymphoma/STABLE.  # Recommend continued surveillance without imaging; would recommend repeating MRI if left eye symptoms worse.  Patient is most likely should be cured of his lymphoma.  # Left eye discomfort/tearing-elated to allergies/underlying uveitis/scleritis.  Stable.  Prednisolone eyedrops as per ophthalmology;   Recommend to take the MRI scans/discs.  Follow-up with rheumatology.  #Stage III colon cancer- NED; Stable; Dr.Elliot- last in nov 2016.  Again likely cured.  Maternal grandfather with colon cancer at age of 92. ? Check MMR/discuss at next visit.   #Disposition : # Follow up in 6 months/labs-cbc/cmp/cea  # I reviewed the blood work- with the patient in detail; also reviewed the imaging independently [as summarized above]; and with the patient in detail.   Cc; Dr.Crystal.   Cc; Drs.Jaffe/Lylegold duke/ Dr.Kernodle.

## 2018-05-24 NOTE — Progress Notes (Signed)
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 On Mxt [summer 2017]  # Lung nodule [incidental on CT]; last Nov 2014- 60m RML- STABLE [no further fu]  # ? MMR of colon   DIAGNOSIS: # MALTOMA of Left orbit- stage I # Colon ca stage III ;GOALS: cure  CURRENT/MOST RECENT THERAPY: surveillaince      Colon carcinoma ([facility]   05/07/2015 Initial Diagnosis    Colon carcinoma (HCC)     Cancer of descending colon ([facility]   05/05/2016 Initial Diagnosis    Cancer of descending colon ([place]     [place]bital lymphoma ([place]      INTERVAL HISTORY:  A very pleasant 57year old male patient with above history left orbital/ MALT lymphoma is here for follow-up; patient finished radiation approximately 6 months ago is here to review the results of his MRI.  [facility]  His vision is fairly steady in his left eye.  Denies any new lumps or bumps.  Appetite is good.  No nausea no vomiting.  No abdominal pain.  No blood in stools or black or stools.  Review of Systems  Constitutional: Negative for chills, diaphoresis, fever, malaise/fatigue and weight loss.  HENT: Negative for nosebleeds and sore throat.   Eyes: Negative for double vision.       Left eye chronic poor vision/runny eyes.  Respiratory: Negative for cough, hemoptysis, sputum production, shortness of breath and wheezing.   Cardiovascular: Negative for chest pain, palpitations,  orthopnea and leg swelling.  Gastrointestinal: Negative for abdominal pain, blood in stool, constipation, diarrhea, heartburn, melena, nausea and vomiting.  Genitourinary: Negative for dysuria, frequency and urgency.  Musculoskeletal: Negative for back pain and joint pain.  Skin: Negative.  Negative for itching and rash.  Neurological: Negative for dizziness, tingling, focal weakness, weakness and headaches.  Endo/Heme/Allergies: Does not bruise/bleed easily.  Psychiatric/Behavioral: Negative for depression. The patient is not nervous/anxious and does not have insomnia.      PAST MEDICAL HISTORY :  Past Medical History:  Diagnosis Date  . Abnormal heart rhythm   . Colon cancer ([place]    sigmoid  . COPD (chronic obstructive pulmonary disease) (HCC)    MILD  . Dysrhythmia   . Hemorrhoid   . History of chemotherapy    FOLFOX  . Lung nodule    RIGHT/ NO PROGRESSION  . Mitral valve prolapse    Pt reports Dr. GRockey Situtold him NO MVP  . Rheumatoid arthritis ([place]     PAST SURGICAL HISTORY :   Past Surgical History:  Procedure Laterality Date  . AQUEOUS SHUNT Left 10/12/2016   Procedure: AQUEOUS SHUNT with scleral patch graft  Left;  Surgeon: ARonnell Freshwater MD;  [facility]  Service: Ophthalmology;  Laterality: Left;  tube shunt  . BIOPSY Left 10/12/2016   Procedure: BIOPSY (fresh & permanent specimens sent from same location)- conjunctiva left eye;  Surgeon: ARonnell Freshwater MD;  [place] [facility]  Service: Ophthalmology;  Laterality: Left;  . CATARACT EXTRACTION W/PHACO Left 03/01/2018   Procedure: CATARACT EXTRACTION PHACO AND INTRAOCULAR LENS PLACEMENT (IOC);  Surgeon: Birder Robson, MD;  Location: [facility];  Service: Ophthalmology;  Laterality: Left;  [place] 01:49.2 AP% 18.6 CDE 20.32 Fluid Pack Lot # [id number]  . COLON SURGERY    . COLONOSCOPY  2013, 05/16/2008  . ESOPHAGOGASTRODUODENOSCOPY  06/04/08  . EYE SURGERY Left    TUMOR  BIOPSY  . PORT-A-CATH REMOVAL  2010  . PORTACATH PLACEMENT  06/2008    FAMILY HISTORY :   Family History  Problem Relation Age of Onset  . Arthritis Mother   . Heart murmur Father   . Colon cancer Maternal Grandfather   . Rheumatologic disease Maternal Aunt     SOCIAL HISTORY:   Social History   Tobacco Use  . Smoking status: Former Smoker    Packs/day: 1.00    Years: 24.00    Pack years: 24.00    Types: Cigarettes    Start date: 04/15/2008    Last attempt to quit: 07/27/2008    Years since quitting: 9.8  . Smokeless tobacco: Never Used  Substance Use Topics  . Alcohol use: Yes    Alcohol/week: 0.0 standard drinks    Comment: occasional alcohol use  . Drug use: Not Currently    Comment: last used 1989    ALLERGIES:  is allergic to lumigan [bimatoprost] and thorazine [chlorpromazine].  MEDICATIONS:  Current Outpatient Medications  Medication Sig Dispense Refill  . acetaminophen (TYLENOL) 500 MG tablet Take 1,000 mg by mouth every 6 (six) hours as needed for moderate pain or headache.    . brimonidine (ALPHAGAN) 0.2 % ophthalmic solution Place 1 drop into the left eye 2 (two) times daily.     . Calcium Carb-Cholecalciferol (CALCIUM-VITAMIN D3) 600-500 MG-UNIT CAPS Take 1 tablet by mouth daily.    . Cholecalciferol (VITAMIN D) 2000 units CAPS Take 2,000 Units by mouth daily.     . Difluprednate (DUREZOL) 0.05 % EMUL Place 1 drop into the left eye 2 (two) times daily.    Marland Kitchen moxifloxacin (VIGAMOX) 0.5 % ophthalmic solution 1 drop 3 (three) times daily.    . Multiple Vitamins-Minerals (DAILY MULTI) TABS Take 1 tablet by mouth daily.     . predniSONE (DELTASONE) 5 MG tablet Currently on 60m     No current facility-administered medications for this visit.     PHYSICAL EXAMINATION: ECOG PERFORMANCE STATUS: 0 - Asymptomatic  BP 104/66 (BP Location: Left Arm, Patient Position: Sitting)   Pulse (!) 57   Temp 98.1 F (36.7 C) (Tympanic)   Resp 16   Wt 167 lb 1.6 oz (75.8  kg)   BMI 22.05 kg/m   Filed Weights   05/24/18 0955  Weight: 167 lb 1.6 oz (75.8 kg)   Physical Exam  Constitutional: He is oriented to person, place, and time and well-developed, well-nourished, and in no distress.  Patient is alone.  HENT:  Head: Normocephalic and atraumatic.  Mouth/Throat: Oropharynx is clear and moist. No oropharyngeal exudate.  Eyes: Pupils are equal, round, and reactive to light.  Neck: Normal range of motion. Neck supple.  Cardiovascular: Normal rate and regular rhythm.  Pulmonary/Chest: No respiratory distress. He has no wheezes.  Abdominal: Soft. Bowel sounds are normal. He exhibits no distension and no mass. There is no tenderness. There is no rebound and no guarding.  Musculoskeletal: Normal range of motion. He exhibits no edema or tenderness.  Neurological: He is alert  and oriented to person, place, and time.  Skin: Skin is warm.  Psychiatric: Affect normal.     LABORATORY DATA:  I have reviewed the data as listed    Component Value Date/Time   NA 137 04/06/2017 0929   NA 138 05/03/2014 1041   K 3.6 04/06/2017 0929   K 3.8 05/03/2014 1041   CL 101 04/06/2017 0929   CL 103 05/03/2014 1041   CO2 29 04/06/2017 0929   CO2 30 05/03/2014 1041   GLUCOSE 100 (H) 04/06/2017 0929   GLUCOSE 102 (H) 05/03/2014 1041   BUN 16 04/06/2017 0929   BUN 15 05/03/2014 1041   CREATININE 1.03 04/06/2017 0929   CREATININE 1.01 05/03/2014 1041   CALCIUM 9.0 04/06/2017 0929   CALCIUM 9.2 05/03/2014 1041   PROT 7.0 04/06/2017 0929   PROT 7.5 05/03/2014 1041   ALBUMIN 4.2 04/06/2017 0929   ALBUMIN 3.6 05/03/2014 1041   AST 24 04/06/2017 0929   AST 19 05/03/2014 1041   ALT 16 (L) 04/06/2017 0929   ALT 20 05/03/2014 1041   ALKPHOS 39 04/06/2017 0929   ALKPHOS 52 05/03/2014 1041   BILITOT 0.9 04/06/2017 0929   BILITOT 0.7 05/03/2014 1041   GFRNONAA >60 04/06/2017 0929   GFRNONAA >60 05/03/2014 1041   GFRNONAA >60 05/04/2013 0952   GFRAA >60 04/06/2017 0929    GFRAA >60 05/03/2014 1041   GFRAA >60 05/04/2013 0952    No results found for: SPEP, UPEP  Lab Results  Component Value Date   WBC 7.5 08/02/2017   NEUTROABS 4.6 04/06/2017   HGB 14.7 08/02/2017   HCT 43.1 08/02/2017   MCV 91.1 08/02/2017   PLT 209 08/02/2017      Chemistry      Component Value Date/Time   NA 137 04/06/2017 0929   NA 138 05/03/2014 1041   K 3.6 04/06/2017 0929   K 3.8 05/03/2014 1041   CL 101 04/06/2017 0929   CL 103 05/03/2014 1041   CO2 29 04/06/2017 0929   CO2 30 05/03/2014 1041   BUN 16 04/06/2017 0929   BUN 15 05/03/2014 1041   CREATININE 1.03 04/06/2017 0929   CREATININE 1.01 05/03/2014 1041      Component Value Date/Time   CALCIUM 9.0 04/06/2017 0929   CALCIUM 9.2 05/03/2014 1041   ALKPHOS 39 04/06/2017 0929   ALKPHOS 52 05/03/2014 1041   AST 24 04/06/2017 0929   AST 19 05/03/2014 1041   ALT 16 (L) 04/06/2017 0929   ALT 20 05/03/2014 1041   BILITOT 0.9 04/06/2017 0929   BILITOT 0.7 05/03/2014 1041       RADIOGRAPHIC STUDIES: IMPRESSION: 1. Moderate to severe circumferential soft tissue thickening and enhancement of the left retina/globe with similar crescent-shaped 1.8 cm retrobulbar enhancing soft tissue mass which appeared hypercellular on the recent brain MRI. No associated inflammation. Appearance highly suspicious for orbital malignancy such as Lymphoma. 2. The left optic nerve appears atrophied. 3. Mechanical appearing reservoir type device along the left superior globe. 4. The right orbit and optic chiasm appear normal.   Electronically Signed   By: Genevie Ann M.D.   On: 04/27/2017 12:13  ------------------------------------------------------------    IMPRESSION: 1. Nearly resolved smooth circumferential enhancement of left globe retina with minimal residual. 2. Small crescent of enhancement posterior to left globe is markedly decreased in size from 04/27/2017 compatible with residual/sequelae of prior  lymphoma. 3. Severe left optic nerve atrophy.   Electronically Signed   By: Edgardo Roys.D.  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 S/p RT [jan 15th 2019]. MRI Oct 2019-improvement of the posterior orbital enhancement/lymphoma/STABLE.  # Recommend continued surveillance without imaging; would recommend repeating MRI if left eye symptoms worse.  Patient is most likely should be cured of his lymphoma.  # Left eye discomfort/tearing-elated to allergies/underlying uveitis/scleritis.  Stable.  Prednisolone eyedrops as per ophthalmology;   Recommend to take the MRI scans/discs.  Follow-up with rheumatology.  #Stage III colon cancer- NED; Stable; Dr.Elliot- last in nov 2016.  Again likely cured.  Maternal grandfather with colon cancer at age of 31. ? Check MMR/discuss at next visit.   #Disposition : # Follow up in 6 months/labs-cbc/cmp/cea  # I reviewed the blood work- with the patient in detail; also reviewed the imaging independently [as summarized above]; and with the patient in detail.   Cc; Dr.Crystal.   Cc; Drs.Jaffe/Lylegold [facility]/ Dr.[facility]ernodle.     Cammie Sickle, MD 05/24/2018 1:08 PM

## 2018-06-21 ENCOUNTER — Telehealth: Payer: Self-pay

## 2018-06-21 NOTE — Telephone Encounter (Signed)
-----   Message from Secundino Ginger sent at 06/21/2018  9:54 AM EST ----- Regarding: Stopping medication Contact: [phone] He had eye appt and has been on Prednisone, Dr Tomi Likens said it was ok for him to stop the prednisone. He was supposed to send a msg to DR B and wondered what Dr B thought. He didn't want to stop if Dr B didn't agree with Dr Tomi Likens. You can leave a msg on answering machine.

## 2018-06-21 NOTE — Telephone Encounter (Signed)
Per. D B, he agrees with Dr. Georgie Chard recommendations to discontinue prednisone. I contacted patient and notified him of Dr. Sharmaine Base comments. Patient verbalized understanding.

## 2018-08-16 LAB — HM COLONOSCOPY

## 2018-10-10 ENCOUNTER — Ambulatory Visit: Payer: Managed Care, Other (non HMO) | Admitting: Radiation Oncology

## 2018-11-15 ENCOUNTER — Other Ambulatory Visit: Payer: Self-pay

## 2018-11-15 DIAGNOSIS — C8599 Non-Hodgkin lymphoma, unspecified, extranodal and solid organ sites: Secondary | ICD-10-CM

## 2018-11-15 DIAGNOSIS — D487 Neoplasm of uncertain behavior of other specified sites: Secondary | ICD-10-CM

## 2018-11-15 DIAGNOSIS — C186 Malignant neoplasm of descending colon: Secondary | ICD-10-CM

## 2018-11-22 ENCOUNTER — Other Ambulatory Visit: Payer: Managed Care, Other (non HMO)

## 2018-11-22 ENCOUNTER — Ambulatory Visit: Payer: Managed Care, Other (non HMO) | Admitting: Internal Medicine

## 2018-11-24 ENCOUNTER — Ambulatory Visit: Payer: Managed Care, Other (non HMO) | Admitting: Hematology and Oncology

## 2018-11-24 ENCOUNTER — Other Ambulatory Visit: Payer: Managed Care, Other (non HMO)

## 2018-11-29 ENCOUNTER — Other Ambulatory Visit: Payer: Managed Care, Other (non HMO)

## 2018-11-29 ENCOUNTER — Ambulatory Visit: Payer: Managed Care, Other (non HMO) | Admitting: Hematology and Oncology

## 2018-12-20 ENCOUNTER — Other Ambulatory Visit: Payer: Managed Care, Other (non HMO)

## 2018-12-20 ENCOUNTER — Ambulatory Visit: Payer: Managed Care, Other (non HMO) | Admitting: Internal Medicine

## 2019-01-23 ENCOUNTER — Encounter: Payer: Self-pay | Admitting: Internal Medicine

## 2019-01-23 ENCOUNTER — Inpatient Hospital Stay: Payer: Managed Care, Other (non HMO) | Attending: Internal Medicine

## 2019-01-23 ENCOUNTER — Inpatient Hospital Stay (HOSPITAL_BASED_OUTPATIENT_CLINIC_OR_DEPARTMENT_OTHER): Payer: Managed Care, Other (non HMO) | Admitting: Internal Medicine

## 2019-01-23 ENCOUNTER — Other Ambulatory Visit: Payer: Self-pay | Admitting: *Deleted

## 2019-01-23 ENCOUNTER — Other Ambulatory Visit: Payer: Self-pay

## 2019-01-23 DIAGNOSIS — Z85038 Personal history of other malignant neoplasm of large intestine: Secondary | ICD-10-CM

## 2019-01-23 DIAGNOSIS — C8599 Non-Hodgkin lymphoma, unspecified, extranodal and solid organ sites: Secondary | ICD-10-CM

## 2019-01-23 DIAGNOSIS — G44009 Cluster headache syndrome, unspecified, not intractable: Secondary | ICD-10-CM | POA: Insufficient documentation

## 2019-01-23 DIAGNOSIS — Z800 Family history of malignant neoplasm of digestive organs: Secondary | ICD-10-CM | POA: Insufficient documentation

## 2019-01-23 DIAGNOSIS — C186 Malignant neoplasm of descending colon: Secondary | ICD-10-CM

## 2019-01-23 DIAGNOSIS — Z87891 Personal history of nicotine dependence: Secondary | ICD-10-CM | POA: Insufficient documentation

## 2019-01-23 DIAGNOSIS — Z79899 Other long term (current) drug therapy: Secondary | ICD-10-CM | POA: Diagnosis not present

## 2019-01-23 DIAGNOSIS — J449 Chronic obstructive pulmonary disease, unspecified: Secondary | ICD-10-CM | POA: Insufficient documentation

## 2019-01-23 DIAGNOSIS — C187 Malignant neoplasm of sigmoid colon: Secondary | ICD-10-CM | POA: Insufficient documentation

## 2019-01-23 DIAGNOSIS — M069 Rheumatoid arthritis, unspecified: Secondary | ICD-10-CM | POA: Insufficient documentation

## 2019-01-23 DIAGNOSIS — Z7982 Long term (current) use of aspirin: Secondary | ICD-10-CM

## 2019-01-23 DIAGNOSIS — I341 Nonrheumatic mitral (valve) prolapse: Secondary | ICD-10-CM | POA: Insufficient documentation

## 2019-01-23 DIAGNOSIS — C884 Extranodal marginal zone B-cell lymphoma of mucosa-associated lymphoid tissue [MALT-lymphoma]: Secondary | ICD-10-CM

## 2019-01-23 LAB — COMPREHENSIVE METABOLIC PANEL
ALT: 16 U/L (ref 0–44)
AST: 20 U/L (ref 15–41)
Albumin: 4 g/dL (ref 3.5–5.0)
Alkaline Phosphatase: 57 U/L (ref 38–126)
Anion gap: 10 (ref 5–15)
BUN: 18 mg/dL (ref 6–20)
CO2: 26 mmol/L (ref 22–32)
Calcium: 8.9 mg/dL (ref 8.9–10.3)
Chloride: 103 mmol/L (ref 98–111)
Creatinine, Ser: 1.01 mg/dL (ref 0.61–1.24)
GFR calc Af Amer: >60 mL/min (ref >60)
GFR calc non Af Amer: >60 mL/min (ref >60)
Glucose, Bld: 92 mg/dL (ref 70–99)
Potassium: 4 mmol/L (ref 3.5–5.1)
Sodium: 139 mmol/L (ref 135–145)
Total Bilirubin: 0.8 mg/dL (ref 0.3–1.2)
Total Protein: 7.3 g/dL (ref 6.5–8.1)

## 2019-01-23 LAB — CBC WITH DIFFERENTIAL/PLATELET
Abs Immature Granulocytes: 0.02 10*3/uL (ref 0.00–0.07)
Basophils Absolute: 0 10*3/uL (ref 0.0–0.1)
Basophils Relative: 0 %
Eosinophils Absolute: 0.3 10*3/uL (ref 0.0–0.5)
Eosinophils Relative: 5 %
HCT: 42.1 % (ref 39.0–52.0)
Hemoglobin: 14.9 g/dL (ref 13.0–17.0)
Immature Granulocytes: 0 %
Lymphocytes Relative: 26 %
Lymphs Abs: 1.7 10*3/uL (ref 0.7–4.0)
MCH: 30 pg (ref 26.0–34.0)
MCHC: 35.4 g/dL (ref 30.0–36.0)
MCV: 84.7 fL (ref 80.0–100.0)
Monocytes Absolute: 0.7 10*3/uL (ref 0.1–1.0)
Monocytes Relative: 10 %
Neutro Abs: 3.9 10*3/uL (ref 1.7–7.7)
Neutrophils Relative %: 59 %
Platelets: 219 10*3/uL (ref 150–400)
RBC: 4.97 MIL/uL (ref 4.22–5.81)
RDW: 13 % (ref 11.5–15.5)
WBC: 6.6 10*3/uL (ref 4.0–10.5)
nRBC: 0 % (ref 0.0–0.2)

## 2019-01-23 NOTE — Progress Notes (Signed)
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 On Mxt [summer 2017]  # Lung nodule [incidental on CT]; last Nov 2014- 35m RML- STABLE [no further fu]  # ? 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  [facility] he had ultrasound of his eye done.  Notes to have slightly more prominent lump on the right neck.  No pain.  No hoarseness of voice.  Appetite is good.  No weight loss.  Review of Systems  Constitutional: Negative for chills, diaphoresis, fever, malaise/fatigue and weight loss.  HENT: Negative for nosebleeds and sore throat.   Eyes: Negative for double vision.       Left eye chronic poor vision/runny eyes.  Respiratory: Negative for cough, hemoptysis, sputum production, shortness of breath and wheezing.   Cardiovascular: Negative for chest  pain, palpitations, orthopnea and leg swelling.  Gastrointestinal: Negative for abdominal pain, blood in stool, constipation, diarrhea, heartburn, melena, nausea and vomiting.  Genitourinary: Negative for dysuria, frequency and urgency.  Musculoskeletal: Negative for back pain and joint pain.  Skin: Negative.  Negative for itching and rash.  Neurological: Negative for dizziness, tingling, focal weakness, weakness and headaches.  Endo/Heme/Allergies: Does not bruise/bleed easily.  Psychiatric/Behavioral: Negative for depression. The patient is not nervous/anxious and does not have insomnia.      PAST MEDICAL HISTORY :  Past Medical History:  Diagnosis Date  . Abnormal heart rhythm   . Colon cancer ([place]    sigmoid  . COPD (chronic obstructive pulmonary disease) (HCC)    MILD  . Dysrhythmia   . Hemorrhoid   . History of chemotherapy    FOLFOX  . Lung nodule    RIGHT/ NO PROGRESSION  . Mitral valve prolapse    Pt reports Dr. GRockey Situtold him NO MVP  . Rheumatoid arthritis ([place]     PAST SURGICAL HISTORY :   Past Surgical History:  Procedure Laterality Date  . AQUEOUS SHUNT Left 10/12/2016   Procedure: AQUEOUS SHUNT with scleral patch graft  Left;  Surgeon: ARonnell Freshwater MD;  [place] [facility]  Service: Ophthalmology;  Laterality: Left;  tube shunt  . BIOPSY Left 10/12/2016   Procedure: BIOPSY (fresh & permanent specimens sent from same location)- conjunctiva left eye;  Surgeon: ARonnell Freshwater MD;  [place] [facility]  Service: Ophthalmology;  Laterality: Left;  . CATARACT EXTRACTION  W/PHACO Left 03/01/2018   Procedure: CATARACT EXTRACTION PHACO AND INTRAOCULAR LENS PLACEMENT (IOC);  Surgeon: Birder Robson, MD;  Location: [facility];  Service: Ophthalmology;  Laterality: Left;  [place] 01:49.2 AP% 18.6 CDE 20.32 Fluid Pack Lot # [id number]  . COLON SURGERY    . COLONOSCOPY  2013, 05/16/2008  . ESOPHAGOGASTRODUODENOSCOPY  06/04/08  . EYE  SURGERY Left    TUMOR BIOPSY  . PORT-A-CATH REMOVAL  2010  . PORTACATH PLACEMENT  06/2008    FAMILY HISTORY :   Family History  Problem Relation Age of Onset  . Arthritis Mother   . Heart murmur Father   . Colon cancer Maternal Grandfather   . Rheumatologic disease Maternal Aunt     SOCIAL HISTORY:   Social History   Tobacco Use  . Smoking status: Former Smoker    Packs/day: 1.00    Years: 24.00    Pack years: 24.00    Types: Cigarettes    Start date: 04/15/2008    Quit date: 07/27/2008    Years since quitting: 10.4  . Smokeless tobacco: Never Used  Substance Use Topics  . Alcohol use: Yes    Alcohol/week: 0.0 standard drinks    Comment: occasional alcohol use  . Drug use: Not Currently    Comment: last used 1989    ALLERGIES:  is allergic to lumigan [bimatoprost] and thorazine [chlorpromazine].  MEDICATIONS:  Current Outpatient Medications  Medication Sig Dispense Refill  . aspirin EC 81 MG tablet Take 81 mg by mouth daily.    . brimonidine (ALPHAGAN) 0.2 % ophthalmic solution Place 1 drop into the left eye 2 (two) times daily.     . Calcium Carb-Cholecalciferol (CALCIUM-VITAMIN D3) 600-500 MG-UNIT CAPS Take 1 tablet by mouth daily.    . Cholecalciferol (VITAMIN D) 2000 units CAPS Take 2,000 Units by mouth daily.     . Difluprednate (DUREZOL) 0.05 % EMUL Place 1 drop into the left eye 2 (two) times daily.    Marland Kitchen moxifloxacin (VIGAMOX) 0.5 % ophthalmic solution 1 drop 3 (three) times daily.    . Multiple Vitamins-Minerals (DAILY MULTI) TABS Take 1 tablet by mouth daily.     Marland Kitchen acetaminophen (TYLENOL) 500 MG tablet Take 1,000 mg by mouth every 6 (six) hours as needed for moderate pain or headache.    . Calcium Carbonate-Vitamin D (OYSTER SHELL CALCIUM 500 + D) 500-125 MG-UNIT TABS Take 1 tablet by mouth 1 day or 1 dose.     No current facility-administered medications for this visit.     PHYSICAL EXAMINATION: ECOG PERFORMANCE STATUS: 0 - Asymptomatic  BP 107/70 (BP  Location: Right Arm, Patient Position: Sitting)   Pulse 62   Temp 98.5 F (36.9 C) (Tympanic)   Ht '6\' 1"'  (1.854 m)   Wt 174 lb 3.2 oz (79 kg)   BMI 22.98 kg/m   Filed Weights   01/23/19 0944  Weight: 174 lb 3.2 oz (79 kg)   Physical Exam  Constitutional: He is oriented to person, place, and time and well-developed, well-nourished, and in no distress.  Patient is alone.  HENT:  Head: Normocephalic and atraumatic.  Mouth/Throat: Oropharynx is clear and moist. No oropharyngeal exudate.  No obvious masses felt in the neck.  Eyes: Pupils are equal, round, and reactive to light.  Neck: Normal range of motion. Neck supple.  Cardiovascular: Normal rate and regular rhythm.  Pulmonary/Chest: No respiratory distress. He has no wheezes.  Abdominal: Soft. Bowel sounds are normal. He exhibits no distension and no mass.  There is no abdominal tenderness. There is no rebound and no guarding.  Musculoskeletal: Normal range of motion.        General: No tenderness or edema.  Neurological: He is alert and oriented to person, place, and time.  Skin: Skin is warm.  Psychiatric: Affect normal.     LABORATORY DATA:  I have reviewed the data as listed    Component Value Date/Time   NA 139 01/23/2019 0919   NA 138 05/03/2014 1041   K 4.0 01/23/2019 0919   K 3.8 05/03/2014 1041   CL 103 01/23/2019 0919   CL 103 05/03/2014 1041   CO2 26 01/23/2019 0919   CO2 30 05/03/2014 1041   GLUCOSE 92 01/23/2019 0919   GLUCOSE 102 (H) 05/03/2014 1041   BUN 18 01/23/2019 0919   BUN 15 05/03/2014 1041   CREATININE 1.01 01/23/2019 0919   CREATININE 1.01 05/03/2014 1041   CALCIUM 8.9 01/23/2019 0919   CALCIUM 9.2 05/03/2014 1041   PROT 7.3 01/23/2019 0919   PROT 7.5 05/03/2014 1041   ALBUMIN 4.0 01/23/2019 0919   ALBUMIN 3.6 05/03/2014 1041   AST 20 01/23/2019 0919   AST 19 05/03/2014 1041   ALT 16 01/23/2019 0919   ALT 20 05/03/2014 1041   ALKPHOS 57 01/23/2019 0919   ALKPHOS 52 05/03/2014 1041    BILITOT 0.8 01/23/2019 0919   BILITOT 0.7 05/03/2014 1041   GFRNONAA >60 01/23/2019 0919   GFRNONAA >60 05/03/2014 1041   GFRNONAA >60 05/04/2013 0952   GFRAA >60 01/23/2019 0919   GFRAA >60 05/03/2014 1041   GFRAA >60 05/04/2013 0952    No results found for: SPEP, UPEP  Lab Results  Component Value Date   WBC 6.6 01/23/2019   NEUTROABS 3.9 01/23/2019   HGB 14.9 01/23/2019   HCT 42.1 01/23/2019   MCV 84.7 01/23/2019   PLT 219 01/23/2019      Chemistry      Component Value Date/Time   NA 139 01/23/2019 0919   NA 138 05/03/2014 1041   K 4.0 01/23/2019 0919   K 3.8 05/03/2014 1041   CL 103 01/23/2019 0919   CL 103 05/03/2014 1041   CO2 26 01/23/2019 0919   CO2 30 05/03/2014 1041   BUN 18 01/23/2019 0919   BUN 15 05/03/2014 1041   CREATININE 1.01 01/23/2019 0919   CREATININE 1.01 05/03/2014 1041      Component Value Date/Time   CALCIUM 8.9 01/23/2019 0919   CALCIUM 9.2 05/03/2014 1041   ALKPHOS 57 01/23/2019 0919   ALKPHOS 52 05/03/2014 1041   AST 20 01/23/2019 0919   AST 19 05/03/2014 1041   ALT 16 01/23/2019 0919   ALT 20 05/03/2014 1041   BILITOT 0.8 01/23/2019 0919   BILITOT 0.7 05/03/2014 1041      ASSESSMENT & PLAN:  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 S/p RT [jan 15th 2019].  #Clinically no evidence of recurrence.;  Continue surveillance without imaging.  Ultrasound sound eye at [facility] in March 2020-significant improvement of choroidal thickness.   # Left eye discomfort/tearing-elated to allergies/underlying uveitis/scleritis.  Stable followed by ophthalmology at [facility].  #Stage III colon cancer-at 89 years;  NED; stable [Maternal grandfather with colon cancer at age of 27. discuss at next visit. declines genetic counseling; wants to think about this. [Brother- in [place]; pt no children]  #Disposition : # Follow up in 6  months-MD;labs-cbc/cmp/cea- Dr.B     Lenetta Quaker  Ann Lions, MD 01/23/2019 11:17 AM

## 2019-01-23 NOTE — Assessment & Plan Note (Addendum)
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 S/p RT [jan 15th 2019].  #Clinically no evidence of recurrence.;  Continue surveillance without imaging.  Ultrasound sound eye at [facility] in March 2020-significant improvement of choroidal thickness.   # Left eye discomfort/tearing-elated to allergies/underlying uveitis/scleritis.  Stable followed by ophthalmology at [facility].  #Stage III colon cancer-at 78 years;  NED; stable [Maternal grandfather with colon cancer at age of 54. discuss at next visit. declines genetic counseling; wants to think about this. [Brother- in [place]; pt no children]  #Disposition : # Follow up in 6 months-MD;labs-cbc/cmp/cea- Dr.B

## 2019-01-24 LAB — CEA: CEA: 1.8 ng/mL (ref 0.0–4.7)

## 2019-03-21 ENCOUNTER — Ambulatory Visit: Payer: Managed Care, Other (non HMO) | Admitting: Cardiovascular Disease

## 2019-04-07 ENCOUNTER — Telehealth: Payer: Self-pay | Admitting: *Deleted

## 2019-04-07 NOTE — Telephone Encounter (Signed)
Patient called reporting that he has been having intermittent RLQ abdominal pain for a month and is asking if he should be evaluated by Dr B for this. Please advise

## 2019-04-10 NOTE — Telephone Encounter (Signed)
Pt can follow up with his PCP; if he cannot get in soon- then evaluation with [facility].

## 2019-04-10 NOTE — Telephone Encounter (Signed)
Patient informed and will call Dr Doy Hutching office for an appointment and call us back if he cannot be seen in the next few days

## 2019-04-10 NOTE — Telephone Encounter (Signed)
Dr. B - please advise. 

## 2019-04-10 NOTE — Telephone Encounter (Signed)
Patient called back and said that because he has not seen Dr Doy Hutching in 2 years, he would not see him and they offered for a prior authorization to see him. I scheduled him for [facility] to[facility]orrow

## 2019-04-11 ENCOUNTER — Other Ambulatory Visit: Payer: Self-pay

## 2019-04-11 ENCOUNTER — Inpatient Hospital Stay: Payer: Managed Care, Other (non HMO) | Attending: Oncology | Admitting: Oncology

## 2019-04-11 ENCOUNTER — Inpatient Hospital Stay: Payer: Managed Care, Other (non HMO) | Admitting: *Deleted

## 2019-04-11 VITALS — BP 116/79 | HR 56 | Temp 98.8°F | Resp 16 | Wt 171.0 lb

## 2019-04-11 DIAGNOSIS — Z79899 Other long term (current) drug therapy: Secondary | ICD-10-CM | POA: Insufficient documentation

## 2019-04-11 DIAGNOSIS — Z85038 Personal history of other malignant neoplasm of large intestine: Secondary | ICD-10-CM | POA: Insufficient documentation

## 2019-04-11 DIAGNOSIS — C884 Extranodal marginal zone B-cell lymphoma of mucosa-associated lymphoid tissue [MALT-lymphoma]: Secondary | ICD-10-CM | POA: Diagnosis present

## 2019-04-11 DIAGNOSIS — Z87891 Personal history of nicotine dependence: Secondary | ICD-10-CM | POA: Diagnosis not present

## 2019-04-11 DIAGNOSIS — J449 Chronic obstructive pulmonary disease, unspecified: Secondary | ICD-10-CM | POA: Insufficient documentation

## 2019-04-11 DIAGNOSIS — Z7982 Long term (current) use of aspirin: Secondary | ICD-10-CM | POA: Insufficient documentation

## 2019-04-11 DIAGNOSIS — Z923 Personal history of irradiation: Secondary | ICD-10-CM | POA: Insufficient documentation

## 2019-04-11 DIAGNOSIS — M069 Rheumatoid arthritis, unspecified: Secondary | ICD-10-CM | POA: Diagnosis not present

## 2019-04-11 DIAGNOSIS — R1031 Right lower quadrant pain: Secondary | ICD-10-CM | POA: Insufficient documentation

## 2019-04-11 DIAGNOSIS — C187 Malignant neoplasm of sigmoid colon: Secondary | ICD-10-CM

## 2019-04-11 LAB — CBC WITH DIFFERENTIAL/PLATELET
Abs Immature Granulocytes: 0.02 10*3/uL (ref 0.00–0.07)
Basophils Absolute: 0 10*3/uL (ref 0.0–0.1)
Basophils Relative: 0 %
Eosinophils Absolute: 0.3 10*3/uL (ref 0.0–0.5)
Eosinophils Relative: 4 %
HCT: 41.6 % (ref 39.0–52.0)
Hemoglobin: 14.3 g/dL (ref 13.0–17.0)
Immature Granulocytes: 0 %
Lymphocytes Relative: 25 %
Lymphs Abs: 1.9 10*3/uL (ref 0.7–4.0)
MCH: 29.7 pg (ref 26.0–34.0)
MCHC: 34.4 g/dL (ref 30.0–36.0)
MCV: 86.5 fL (ref 80.0–100.0)
Monocytes Absolute: 0.8 10*3/uL (ref 0.1–1.0)
Monocytes Relative: 10 %
Neutro Abs: 4.9 10*3/uL (ref 1.7–7.7)
Neutrophils Relative %: 61 %
Platelets: 227 10*3/uL (ref 150–400)
RBC: 4.81 MIL/uL (ref 4.22–5.81)
RDW: 12.9 % (ref 11.5–15.5)
WBC: 7.9 10*3/uL (ref 4.0–10.5)
nRBC: 0 % (ref 0.0–0.2)

## 2019-04-11 LAB — COMPREHENSIVE METABOLIC PANEL
ALT: 15 U/L (ref 0–44)
AST: 19 U/L (ref 15–41)
Albumin: 3.9 g/dL (ref 3.5–5.0)
Alkaline Phosphatase: 56 U/L (ref 38–126)
Anion gap: 6 (ref 5–15)
BUN: 16 mg/dL (ref 6–20)
CO2: 30 mmol/L (ref 22–32)
Calcium: 9 mg/dL (ref 8.9–10.3)
Chloride: 103 mmol/L (ref 98–111)
Creatinine, Ser: 0.9 mg/dL (ref 0.61–1.24)
GFR calc Af Amer: >60 mL/min (ref >60)
GFR calc non Af Amer: >60 mL/min (ref >60)
Glucose, Bld: 99 mg/dL (ref 70–99)
Potassium: 4.5 mmol/L (ref 3.5–5.1)
Sodium: 139 mmol/L (ref 135–145)
Total Bilirubin: 0.5 mg/dL (ref 0.3–1.2)
Total Protein: 7.1 g/dL (ref 6.5–8.1)

## 2019-04-11 NOTE — Progress Notes (Signed)
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 On Mxt [summer 2017]  # Lung nodule [incidental on CT]; last Nov 2014- 75m RML- STABLE [no further fu]  # ? 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  States pain is been present for approximately 1 month.  He rates it a 1 out of 10 and does not require pain medication.  Given his history, he is concerned of either recurrence of cancer or a new cancer.  He denies any injury.  He has been working out which is fairly new for him.  He rides a stationary bike and lifts some weights.  He denies any nausea, vomiting or fevers.  Pain is fairly constant and he feels "pulling" in rlq when he lies flat on his back.   He denies history of hernias.  He has normal bowel movements. Last BM was this morning.  Denies constipation or diarrhea.  He denies any history of urinary complaints including kidney stones.  Patient has history of left orbital MALT lymphoma status post radiation approximately 1 year ago.  [facility]  He is currently on surveillance without evidence of disease.  Ultrasound from March 2020 revealed significant improvement of choroidal thickness.  He continues to have left eye discomfort and tearing.  Also has history of stage III colon cancer which was diagnosed at age [age]which is approximately 11 years ago.  CEA has been stable.   ECOG FS:1 - Symptomatic but completely ambulatory  Review of systems- Review of Systems  Gastrointestinal: Positive for abdominal pain.    Current treatment- observation  Allergies  Allergen Reactions  . Lumigan [Bimatoprost]     REDNESS  . Thorazine [Chlorpromazine] Rash     Past Medical History:  Diagnosis Date  . Abnormal heart rhythm   . Colon cancer ([place]    sigmoid  . COPD (chronic obstructive pulmonary disease) (HCC)    MILD  . Dysrhythmia   . Hemorrhoid   . History of chemotherapy    FOLFOX  . Lung nodule    RIGHT/ NO PROGRESSION  . Mitral valve prolapse    Pt reports Dr. GRockey Situtold him NO MVP  . Rheumatoid arthritis ([facility]      Past Surgical History:  Procedure  Laterality Date  . AQUEOUS SHUNT Left 10/12/2016   Procedure: AQUEOUS SHUNT with scleral patch graft  Left;  Surgeon: Ronnell Freshwater, MD;  Location: [place];  Service: Ophthalmology;  Laterality: Left;  tube shunt  . BIOPSY Left 10/12/2016   Procedure: BIOPSY (fresh & permanent specimens sent from same location)- conjunctiva left eye;  Surgeon: Ronnell Freshwater, MD;  Location: [place];  Service: Ophthalmology;  Laterality: Left;  . CATARACT EXTRACTION W/PHACO Left 03/01/2018   Procedure: CATARACT EXTRACTION PHACO  AND INTRAOCULAR LENS PLACEMENT (IOC);  Surgeon: Birder Robson, MD;  Location: [facility];  Service: Ophthalmology;  Laterality: Left;  [place] 01:49.2 AP% 18.6 CDE 20.32 Fluid Pack Lot # [id number]  . COLON SURGERY    . COLONOSCOPY  2013, 05/16/2008  . ESOPHAGOGASTRODUODENOSCOPY  06/04/08  . EYE SURGERY Left    TUMOR BIOPSY  . PORT-A-CATH REMOVAL  2010  . PORTACATH PLACEMENT  06/2008    Social History   Socioeconomic History  . Marital status: Single    Spouse name: Not on file  . Number of children: Not on file  . Years of education: Not on file  . Highest education level: Not on file  Occupational History  . Not on file  Social Needs  . Financial resource strain: Not on file  . Food insecurity    Worry: Not on file    Inability: Not on file  . Transportation needs    Medical: Not on file    Non-medical: Not on file  Tobacco Use  . Smoking status: Former Smoker    Packs/day: 1.00    Years: 24.00    Pack years: 24.00    Types: Cigarettes    Start date: 04/15/2008    Quit date: 07/27/2008    Years since quitting: 10.7  . Smokeless tobacco: Never Used  Substance and Sexual Activity  . Alcohol use: Yes    Alcohol/week: 0.0 standard drinks    Comment: occasional alcohol use  . Drug use: Not Currently    Comment: last used 1989  . Sexual activity: Not on file  Lifestyle  . Physical activity    Days per week: Not on file    Minutes per session: Not on file  . Stress: Not on file  Relationships  . Social Herbalist on phone: Not on file    Gets together: Not on file    Attends religious service: Not on file    Active member of club or organization: Not on file    Attends meetings of clubs or organizations: Not on file    Relationship status: Not on file  . Intimate partner violence    Fear of current or ex partner: Not on file    Emotionally abused: Not on file    Physically abused: Not on file    Forced sexual activity: Not on file  Other Topics Concern   . Not on file  Social History Narrative   Retired. Single. Regularly exercises.     Family History  Problem Relation Age of Onset  . Arthritis Mother   . Heart murmur Father   . Colon cancer Maternal Grandfather   . Rheumatologic disease Maternal Aunt      Current Outpatient Medications:  .  acetaminophen (TYLENOL) 500 MG tablet, Take 1,000 mg by mouth every 6 (six) hours as needed for moderate pain or headache., Disp: , Rfl:  .  aspirin EC 81 MG tablet, Take 81 mg by  mouth daily., Disp: , Rfl:  .  brimonidine (ALPHAGAN) 0.2 % ophthalmic solution, Place 1 drop into the left eye 2 (two) times daily. , Disp: , Rfl:  .  Calcium Carb-Cholecalciferol (CALCIUM-VITAMIN D3) 600-500 MG-UNIT CAPS, Take 1 tablet by mouth daily., Disp: , Rfl:  .  Cholecalciferol (VITAMIN D) 2000 units CAPS, Take 2,000 Units by mouth daily. , Disp: , Rfl:  .  Difluprednate (DUREZOL) 0.05 % EMUL, Place 1 drop into the left eye 2 (two) times daily., Disp: , Rfl:  .  Multiple Vitamins-Minerals (DAILY MULTI) TABS, Take 1 tablet by mouth daily. , Disp: , Rfl:   Physical exam:  Vitals:   04/11/19 1538 04/11/19 1552 04/11/19 1553  BP:  116/79   Pulse:  (!) 56   Resp:  16   Temp:   98.8 F (37.1 C)  TempSrc:   Tympanic  Weight: 171 lb (77.6 kg) 171 lb (77.6 kg)    Physical Exam Vitals signs reviewed.  Abdominal:     General: Bowel sounds are normal.     Palpations: Abdomen is soft.     Tenderness: There is abdominal tenderness in the right lower quadrant.     Hernia: No hernia is present.    Neurological:     Mental Status: He is alert.      CMP Latest Ref Rng & Units 04/11/2019  Glucose 70 - 99 mg/dL 99  BUN 6 - 20 mg/dL 16  Creatinine 0.61 - 1.24 mg/dL 0.90  Sodium 135 - 145 mmol/L 139  Potassium 3.5 - 5.1 mmol/L 4.5  Chloride 98 - 111 mmol/L 103  CO2 22 - 32 mmol/L 30  Calcium 8.9 - 10.3 mg/dL 9.0  Total Protein 6.5 - 8.1 g/dL 7.1  Total Bilirubin 0.3 - 1.2 mg/dL 0.5  Alkaline Phos 38 - 126  U/L 56  AST 15 - 41 U/L 19  ALT 0 - 44 U/L 15   CBC Latest Ref Rng & Units 04/11/2019  WBC 4.0 - 10.5 K/uL 7.9  Hemoglobin 13.0 - 17.0 g/dL 14.3  Hematocrit 39.0 - 52.0 % 41.6  Platelets 150 - 400 K/uL 227    No images are attached to the encounter.  No results found.   Assessment and plan- Patient is a 58 y.o. male who presents to [facility] for complaints of RLQ abdominal pain X 1 month.   History of colon cancer, diagnosed in 2009: Currently on surveillance.  Patient had PET scan in November 2018 which was negative for lymphoma or metastatic colon cancer. Has had colonoscopy within past year (Cannot find records)  Orbital lymphoma: Most recent ultrasound was negative for recurrent disease.  He is followed by [facility] ophthalmology.   Abdominal pain RLQ: Unclear etiology. Assessment did not reveal any abnormalities. No reproducable pain on exam. Consulted Dr. Starleen Arms who does not recommend aggressive imaging at this time. Recommends abdominal x-ray or CT scan in 1-2 weeks if symptoms continue or worsen. Can try tylenol for pain. It is likely musculoskeletal. Has good BM's.   Plan: Stat labs. Unremarkable. CEA 1.7 Orders placed for abdominal x-ray should symptoms continue or worsen. Start tylenol PRN for pain. Will call with results of abdominal x-ray when resulted.  Disposition: RTC to [facility] as needed.  RTC on 07/25/19 for labs and assessment with Dr. Rogue Bussing. Abdominal x-ray  Visit Diagnosis 1. Right lower quadrant abdominal pain     Patient expressed understanding and was in agreement with this plan. He also understands that He can call clinic  at any time with any questions, concerns, or complaints.   Greater than 50% was spent in counseling and coordination of care with this patient including but not limited to discussion of the relevant topics above (See A&P) including, but not limited to diagnosis and management of acute and chronic medical conditions.   Thank you for  allowing me to participate in the care of this very pleasant patient.    Jacquelin Hawking, NP [place] at [facility] Cell - [phone] Pager- [phone] 04/12/2019 10:18 AM

## 2019-04-12 LAB — CEA: CEA: 1.7 ng/mL (ref 0.0–4.7)

## 2019-04-19 ENCOUNTER — Other Ambulatory Visit: Payer: Self-pay

## 2019-04-19 ENCOUNTER — Ambulatory Visit
Admission: RE | Admit: 2019-04-19 | Discharge: 2019-04-19 | Disposition: A | Discharge status: Home / Self Care | Payer: Managed Care, Other (non HMO) | Source: Ambulatory Visit | Attending: Oncology | Admitting: Oncology

## 2019-04-19 DIAGNOSIS — R1031 Right lower quadrant pain: Secondary | ICD-10-CM | POA: Insufficient documentation

## 2019-04-20 ENCOUNTER — Telehealth: Payer: Self-pay | Admitting: Oncology

## 2019-04-20 NOTE — Progress Notes (Signed)
Abdominal x-ray looks good; mild constipation.  He continues to have intermittent RLQ pain 1/10. Recommended miralax. Has BM every 1-2 days and occasionally has constipation. He is scheduled to see you in December. Would you like additional follow-up for him?  Faythe Casa, NP 04/20/2019 9:25 AM

## 2019-04-20 NOTE — Telephone Encounter (Signed)
Re: Abdominal X-ray results  Spoke to patient and gave him results of abdominal x-ray. Patient states that he continues to have intermittent right lower quadrant abdominal pain.  Rates it a 1 out of 10.  Continues to feel it most while lying down and has a "pulling" sensation.  Has bowel movements regular every 1 to 2 days.  Has occasional constipation which she takes a stool softener for.  Not currently on narcotics.  He denies any nausea, vomiting or diarrhea.  No stool changes.  Has up-to-date colonoscopy. Last in 2016 where 2 polyps were removed.  Patient questions a possible hernia but absent on recent abdominal exam.  Given his history of stage III colon cancer s/p left hemicolectomy and chemo (FOLFOX) in august 2010 will consult Dr. Rogue Bussing to determine if further work-up is needed.  Recommended NSAIDs when needed.  Patient in agreement.  He is to call clinic with worsening symptoms.  Faythe Casa, NP 04/20/2019 9:22 AM  CC: Dr. Rogue Bussing

## 2019-04-20 NOTE — Progress Notes (Signed)
Sounds great! Will do  

## 2019-05-19 ENCOUNTER — Other Ambulatory Visit: Payer: Self-pay | Admitting: Family Medicine

## 2019-05-19 ENCOUNTER — Ambulatory Visit (INDEPENDENT_AMBULATORY_CARE_PROVIDER_SITE_OTHER): Payer: Managed Care, Other (non HMO) | Admitting: Family Medicine

## 2019-05-19 ENCOUNTER — Other Ambulatory Visit: Payer: Self-pay

## 2019-05-19 ENCOUNTER — Encounter: Payer: Self-pay | Admitting: Family Medicine

## 2019-05-19 VITALS — BP 104/66 | HR 63 | Temp 98.4°F | Resp 16 | Ht 73.0 in | Wt 174.0 lb

## 2019-05-19 DIAGNOSIS — C884 Extranodal marginal zone B-cell lymphoma of mucosa-associated lymphoid tissue [MALT-lymphoma]: Secondary | ICD-10-CM

## 2019-05-19 DIAGNOSIS — Z0000 Encounter for general adult medical examination without abnormal findings: Secondary | ICD-10-CM

## 2019-05-19 DIAGNOSIS — Z7689 Persons encountering health services in other specified circumstances: Secondary | ICD-10-CM

## 2019-05-19 DIAGNOSIS — M059 Rheumatoid arthritis with rheumatoid factor, unspecified: Secondary | ICD-10-CM

## 2019-05-19 DIAGNOSIS — Z85038 Personal history of other malignant neoplasm of large intestine: Secondary | ICD-10-CM

## 2019-05-19 DIAGNOSIS — C8599 Non-Hodgkin lymphoma, unspecified, extranodal and solid organ sites: Secondary | ICD-10-CM | POA: Diagnosis not present

## 2019-05-19 DIAGNOSIS — Z79899 Other long term (current) drug therapy: Secondary | ICD-10-CM

## 2019-05-19 DIAGNOSIS — R7309 Other abnormal glucose: Secondary | ICD-10-CM

## 2019-05-19 DIAGNOSIS — B354 Tinea corporis: Secondary | ICD-10-CM

## 2019-05-19 DIAGNOSIS — R351 Nocturia: Secondary | ICD-10-CM

## 2019-05-19 DIAGNOSIS — Z114 Encounter for screening for human immunodeficiency virus [HIV]: Secondary | ICD-10-CM

## 2019-05-19 MED ORDER — CICLOPIROX OLAMINE 0.77 % EX CREA: TOPICAL_CREAM | Freq: Two times a day (BID) | CUTANEOUS | 2 refills | Status: DC

## 2019-05-19 NOTE — Progress Notes (Signed)
Subjective:    Patient ID: Benjamin Clarke, male    DOB: 19-Jul-1961, 58 y.o.   MRN: [id number]  Benjamin Clarke is a 58 y.o. male presenting on 05/19/2019 for Establish Care (left side lower extremities skin rash 7-10 days and bumps onset 2 days )  Here to establish with new PCP. 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 He says fam history of colon CA.  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 Used topical cortisone temporary relief.   Health Maintenance:  Colon CA Screening: Last Colonoscopy done 08/16/18 (done by Dr Rolly Salter [facility]), results unavailable, good for 3 years by report - will request copy of formal report. Currently asymptomatic. Significant known family history of colon CA at earlier age. Next due in 2023 most likely  UTD Hep C screening negative, 06/03/2016  Depression screen [facility] 2/9 05/19/2019 06/28/2017  Decreased Interest 0 0  Down, Depressed, Hopeless 0 0  PHQ - 2 Score 0 0    Past Medical History:  Diagnosis Date  . Abnormal heart rhythm   . Colon cancer ([place])    sigmoid  . COPD (chronic obstructive pulmonary disease) (HCC)    MILD  . Dysrhythmia   . Hemorrhoid   . History of chemotherapy    FOLFOX  . Lung nodule    RIGHT/ NO PROGRESSION  . Mitral valve prolapse    Pt reports Dr. Rockey Situ told him NO MVP  . Rheumatoid arthritis [facility])    Past Surgical History:  Procedure Laterality Date  . AQUEOUS SHUNT Left 10/12/2016   Procedure: AQUEOUS SHUNT with scleral patch graft  Left;  Surgeon: Ronnell Freshwater, MD;  Location: [place];  Service: Ophthalmology;  Laterality: Left;  tube shunt  . BIOPSY Left 10/12/2016   Procedure: BIOPSY (fresh & permanent specimens sent from same location)- conjunctiva left eye;  Surgeon: Ronnell Freshwater, MD;  Location: [place];  Service: Ophthalmology;  Laterality: Left;  . CATARACT EXTRACTION W/PHACO Left 03/01/2018   Procedure: CATARACT EXTRACTION PHACO AND INTRAOCULAR LENS PLACEMENT (IOC);  Surgeon: Birder Robson, MD;  Location: [facility];  Service: Ophthalmology;  Laterality: Left;  [place] 01:49.2 AP% 18.6 CDE 20.32 Fluid Pack Lot # [id number]  . COLON SURGERY    . COLONOSCOPY  2013, 05/16/2008  . ESOPHAGOGASTRODUODENOSCOPY  06/04/08  . EYE SURGERY Left    TUMOR BIOPSY  . PORT-A-CATH REMOVAL  2010  . PORTACATH PLACEMENT  06/2008   Social History   Socioeconomic History  . Marital status: Single    Spouse name: Not on file  . Number of children: Not on file  . Years of education: College  . Highest education level: Not on file  Occupational History  . Not on file  Social Needs  . Financial  resource strain: Not on file  . Food insecurity    Worry: Not on file    Inability: Not on file  . Transportation needs    Medical: Not on file    Non-medical: Not on file  Tobacco Use  . Smoking status: Former Smoker    Packs/day: 1.00    Years: 24.00    Pack years: 24.00    Types: Cigarettes    Start date: 04/15/2008    Quit date: 07/27/2008    Years since quitting: 10.8  . Smokeless tobacco: Former Network engineer and Sexual Activity  . Alcohol use: Not Currently    Alcohol/week: 0.0 standard drinks     Comment: occasional alcohol use  . Drug use: Not Currently    Types: Marijuana    Comment: last used 1989  . Sexual activity: Not on file  Lifestyle  . Physical activity    Days per week: Not on file    Minutes per session: Not on file  . Stress: Not on file  Relationships  . Social Herbalist on phone: Not on file    Gets together: Not on file    Attends religious service: Not on file    Active member of club or organization: Not on file    Attends meetings of clubs or organizations: Not on file    Relationship status: Not on file  . Intimate partner violence    Fear of current or ex partner: Not on file    Emotionally abused: Not on file    Physically abused: Not on file    Forced sexual activity: Not on file  Other Topics Concern  . Not on file  Social History Narrative   Retired. Single. Regularly exercises.    Family History  Problem Relation Age of Onset  . Arthritis Mother   . Colon polyps Mother   . Heart murmur Father   . Valvular heart disease Father 80  . Colon cancer Maternal Grandfather 61  . Rheumatologic disease Maternal Aunt    Current Outpatient Medications on File Prior to Visit  Medication Sig  . acetaminophen (TYLENOL) 500 MG tablet Take 1,000 mg by mouth every 6 (six) hours as needed for moderate pain or headache.  . ALPHAGAN P 0.1 % SOLN INSTILL 1 DROP INTO LEFT EYE TWICE A DAY  . aspirin EC 81 MG tablet Take 81 mg by mouth daily.  . brimonidine (ALPHAGAN) 0.2 % ophthalmic solution Place 1 drop into the left eye 2 (two) times daily.   . Calcium Carb-Cholecalciferol (CALCIUM-VITAMIN D3) 600-500 MG-UNIT CAPS Take 1 tablet by mouth daily.  . Cholecalciferol (VITAMIN D) 2000 units CAPS Take 2,000 Units by mouth daily.   . Difluprednate (DUREZOL) 0.05 % EMUL Place 1 drop into the left eye 2 (two) times daily.  . Multiple Vitamins-Minerals (DAILY MULTI) TABS Take 1 tablet by mouth daily.    No current facility-administered medications on file  prior to visit.     Review of Systems Per HPI unless specifically indicated above      Objective:    BP 104/66   Pulse 63   Temp 98.4 F (36.9 C) (Oral)   Resp 16   Ht 6\' 1"  (1.854 m)   Wt 174 lb (78.9 kg)   SpO2 99%   BMI 22.96 kg/m   Wt Readings from Last 3 Encounters:  05/19/19 174 lb (78.9 kg)  04/11/19 171 lb (77.6 kg)  01/23/19 174 lb 3.2 oz (79  kg)    Physical Exam Vitals signs and nursing note reviewed.  Constitutional:      General: He is not in acute distress.    Appearance: He is well-developed. He is not diaphoretic.     Comments: Well-appearing, comfortable, cooperative  HENT:     Head: Normocephalic and atraumatic.  Eyes:     General:        Right eye: No discharge.        Left eye: No discharge.     Conjunctiva/sclera: Conjunctivae normal.  Cardiovascular:     Rate and Rhythm: Normal rate.  Pulmonary:     Effort: Pulmonary effort is normal.  Skin:    General: Skin is warm and dry.     Findings: Rash (scattered patches 1-2 cm in size about 5 or 7 of them across outer left lower extremity, no central clearing, some flaky scaley red appearing well defined) present. No erythema.  Neurological:     Mental Status: He is alert and oriented to person, place, and time.  Psychiatric:        Behavior: Behavior normal.     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  #MALT / Lymphoma orbit L / Chronic Uveitis Followed by Oncology / Ophthalmology Review records  #Tinea LLE Rash No appearance of shingles or bacterial infection Question if possible drug reaction but it is fairly localized and has a flaky appearance more with dermatophyte  Trial on ciclopirox topical given appearance and symptoms of rash, possibly increased risk with his history of autoimmune - If need can trial terbinafine oral, otherwise may warrant further refer back to Dr Nehemiah Massed if change in rash or new concerning features.   Meds ordered this encounter  Medications  . ciclopirox (LOPROX) 0.77 % cream    Sig: Apply topically 2 (two) times daily. For 2 weeks then as needed    Dispense:  30 g    Refill:  2    Follow up plan: Return in about 4 months (around 09/19/2019) for Annual Physical.   Future labs ordered for 08/2019  Nobie Putnam, [facility] 05/19/2019, 10:20 AM

## 2019-05-19 NOTE — Patient Instructions (Addendum)
Thank you for coming to the office today.  Skin rash looks like a possible Tinea or fungal infection, can be due to immune system or can be a normal episode that just has affected you.   Start Ciclopirox topical antifungal twice a day for 2 weeks then as needed. We can try an oral anti fungal if needed in future or refer back to Dr Nehemiah Massed if needed.  Keep me updated on other health and we can discuss more next time.   DUE for FASTING BLOOD WORK (no food or drink after midnight before the lab appointment, only water or coffee without cream/sugar on the morning of)  SCHEDULE "Lab Only" visit in the morning at the clinic for lab draw in 4 MONTHS   - Make sure Lab Only appointment is at about 1 week before your next appointment, so that results will be available  For Lab Results, once available within 2-3 days of blood draw, you can can log in to MyChart online to view your results and a brief explanation. Also, we can discuss results at next follow-up visit.   Please schedule a Follow-up Appointment to: Return in about 4 months (around 09/19/2019) for Annual Physical.  If you have any other questions or concerns, please feel free to call the office or send a message through [place]. You may also schedule an earlier appointment if necessary.  Additionally, you may be receiving a survey about your experience at our office within a few days to 1 week by e-mail or mail. We value your feedback.  Nobie Putnam, DO [place]

## 2019-05-22 ENCOUNTER — Inpatient Hospital Stay: Payer: Managed Care, Other (non HMO) | Attending: Oncology | Admitting: Oncology

## 2019-05-25 ENCOUNTER — Encounter: Payer: Self-pay | Admitting: Family Medicine

## 2019-06-26 ENCOUNTER — Telehealth: Payer: Self-pay | Admitting: Family Medicine

## 2019-06-26 DIAGNOSIS — R21 Rash and other nonspecific skin eruption: Secondary | ICD-10-CM

## 2019-06-26 NOTE — Telephone Encounter (Signed)
Patient was seen in 04/2019, given rx ciclopirox cream to use for possible fungal tinea rash.  Could you call patient and clarify his response on medicine? Did it help initially and then the rash came back? Or did it not help at all?  We could try an oral anti fungal therapy as I recommended at his last visit next if he thinks that the rash was somewhat helpful but just did not resolve the problem.  Terbinafine 250mg  once daily for 2 weeks.  If that doesn't work then next step would be referral to Dermatology.  Nobie Putnam, [facility] 06/26/2019, 4:05 PM

## 2019-06-26 NOTE — Telephone Encounter (Signed)
The pt Is interested in the oral medication, but would like to know the side effects. He also stated that he didn't notice any improvement with the topical cream. He states that the rash is actually getting worse.

## 2019-06-26 NOTE — Telephone Encounter (Signed)
Pt called said that the medication for the rash was not working wanted to know if you would call something else in

## 2019-06-26 NOTE — Telephone Encounter (Signed)
Called patient back.  Discussed his concerns.  We agree HOLD on terbinafine pill for now, since he is using a compounded zinc cream from other dermatology and it helps some.  We will instead send referral. He can use zinc cream or OTC barrier cream for now in interval until his apt.  Referral to Dr Nehemiah Massed [facility] (previous patient) resume care for new problem with rash on body several areas scaly patches, failed topical antifungal and steroid, and now patient request to return to Dermatology, used some compounded rx with zinc and has improved some, requesting opinion of Dermatologist  Nobie Putnam, [facility] 06/26/2019, 5:35 PM

## 2019-06-30 ENCOUNTER — Encounter: Payer: Self-pay | Admitting: Family Medicine

## 2019-06-30 DIAGNOSIS — L300 Nummular dermatitis: Secondary | ICD-10-CM | POA: Insufficient documentation

## 2019-07-24 ENCOUNTER — Other Ambulatory Visit: Payer: Self-pay | Admitting: Licensed Clinical Social Worker

## 2019-07-24 ENCOUNTER — Telehealth: Payer: Self-pay | Admitting: Licensed Clinical Social Worker

## 2019-07-24 DIAGNOSIS — C187 Malignant neoplasm of sigmoid colon: Secondary | ICD-10-CM

## 2019-07-24 NOTE — Telephone Encounter (Signed)
I called the patient to do screening the day prior to his appointment. Patient mentioned that he is doing much better and wants to know if really needs to come in. Spoke with Dr. Rogue Bussing and he is ok with the patient waiting for 4 months. Appt made, patient notified.

## 2019-07-25 ENCOUNTER — Inpatient Hospital Stay: Payer: Managed Care, Other (non HMO)

## 2019-07-25 ENCOUNTER — Inpatient Hospital Stay: Payer: Managed Care, Other (non HMO) | Admitting: Internal Medicine

## 2019-09-13 ENCOUNTER — Other Ambulatory Visit: Payer: Self-pay

## 2019-09-13 ENCOUNTER — Other Ambulatory Visit: Payer: Managed Care, Other (non HMO)

## 2019-09-13 DIAGNOSIS — Z114 Encounter for screening for human immunodeficiency virus [HIV]: Secondary | ICD-10-CM

## 2019-09-13 DIAGNOSIS — R351 Nocturia: Secondary | ICD-10-CM

## 2019-09-13 DIAGNOSIS — Z79899 Other long term (current) drug therapy: Secondary | ICD-10-CM

## 2019-09-13 DIAGNOSIS — Z0000 Encounter for general adult medical examination without abnormal findings: Secondary | ICD-10-CM

## 2019-09-13 DIAGNOSIS — C884 Extranodal marginal zone B-cell lymphoma of mucosa-associated lymphoid tissue [MALT-lymphoma]: Secondary | ICD-10-CM

## 2019-09-13 DIAGNOSIS — R7309 Other abnormal glucose: Secondary | ICD-10-CM

## 2019-09-14 LAB — BASIC METABOLIC PANEL WITH GFR
BUN: 15 mg/dL (ref 7–25)
CO2: 28 mmol/L (ref 20–32)
Calcium: 9.2 mg/dL (ref 8.6–10.3)
Chloride: 104 mmol/L (ref 98–110)
Creat: 1.05 mg/dL (ref 0.70–1.25)
GFR, Est African American: 89 mL/min/{1.73_m2} (ref >=60)
GFR, Est Non African American: 77 mL/min/{1.73_m2} (ref >=60)
Glucose, Bld: 98 mg/dL (ref 65–99)
Potassium: 4.4 mmol/L (ref 3.5–5.3)
Sodium: 140 mmol/L (ref 135–146)

## 2019-09-14 LAB — HEMOGLOBIN A1C
Hgb A1c MFr Bld: 5.3 %{Hb}
Mean Plasma Glucose: 105 (calc)
eAG (mmol/L): 5.8 (calc)

## 2019-09-14 LAB — TSH: TSH: 5.38 m[IU]/L — ABNORMAL HIGH (ref 0.40–4.50)

## 2019-09-14 LAB — HIV ANTIBODY (ROUTINE TESTING W REFLEX): HIV 1&2 Ab, 4th Generation: NONREACTIVE

## 2019-09-14 LAB — LIPID PANEL
Cholesterol: 160 mg/dL
HDL: 61 mg/dL
LDL Cholesterol (Calc): 85 mg/dL
Non-HDL Cholesterol (Calc): 99 mg/dL
Total CHOL/HDL Ratio: 2.6 (calc)
Triglycerides: 55 mg/dL

## 2019-09-14 LAB — PSA: PSA: 0.4 ng/mL (ref <=4.0)

## 2019-09-20 ENCOUNTER — Other Ambulatory Visit: Payer: Self-pay

## 2019-09-20 ENCOUNTER — Encounter: Payer: Self-pay | Admitting: Family Medicine

## 2019-09-20 ENCOUNTER — Ambulatory Visit (INDEPENDENT_AMBULATORY_CARE_PROVIDER_SITE_OTHER): Payer: Managed Care, Other (non HMO) | Admitting: Family Medicine

## 2019-09-20 VITALS — BP 106/60 | HR 71 | Temp 97.3°F | Resp 16 | Ht 72.0 in | Wt 186.0 lb

## 2019-09-20 DIAGNOSIS — Z6825 Body mass index (BMI) 25.0-25.9, adult: Secondary | ICD-10-CM

## 2019-09-20 DIAGNOSIS — Z0000 Encounter for general adult medical examination without abnormal findings: Secondary | ICD-10-CM | POA: Diagnosis not present

## 2019-09-20 DIAGNOSIS — M059 Rheumatoid arthritis with rheumatoid factor, unspecified: Secondary | ICD-10-CM

## 2019-09-20 DIAGNOSIS — Z85038 Personal history of other malignant neoplasm of large intestine: Secondary | ICD-10-CM | POA: Diagnosis not present

## 2019-09-20 DIAGNOSIS — C8599 Non-Hodgkin lymphoma, unspecified, extranodal and solid organ sites: Secondary | ICD-10-CM | POA: Diagnosis not present

## 2019-09-20 DIAGNOSIS — C884 Extranodal marginal zone B-cell lymphoma of mucosa-associated lymphoid tissue [MALT-lymphoma]: Secondary | ICD-10-CM

## 2019-09-20 DIAGNOSIS — Z6824 Body mass index (BMI) 24.0-24.9, adult: Secondary | ICD-10-CM | POA: Insufficient documentation

## 2019-09-20 NOTE — Progress Notes (Signed)
Subjective:    Patient ID: Benjamin Clarke, male    DOB: 13-Dec-1960, 59 y.o.   MRN: [id number]  Benjamin Clarke is a 59 y.o. male presenting on 09/20/2019 for Annual Exam   HPI   Here for Annual Physical and Lab Review.  [facility] On[facility] - [facility] - Dr Rogue Bussing Ophthalmology - [facility] - Dr Carmelina Dane  History of Low BP / Blood Pressure, monitoring. Today BP 120/75, he normally has BP readings 100/60-70s, has prior result 122/83 in 06/2017 He doesn't check BP regularly outside office. He has history of low BP in past.  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 He says fam history of colon CA.  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 No prior TSH readings. Fam history mother with hypothyroidism on synthroid.  Glaucoma Followed by [facility], due for refraction and new lenses - required shunt after failed eye drops for pressure    Health Maintenance:  Colon CA Screening: Last Colonoscopy done 08/16/18 (done by Dr Rolly Salter [facility]), results unavailable, good for 3 years by report - will request copy of formal report. Currently asymptomatic. Significant known family history of colon CA at earlier age. Next due in 2023 most likely  UTD Hep C screening negative, 06/03/2016 UTD HIV screening negative 08/2019  Prostate CA Screening: Prior history of DRE reported normal >10 years ago. Last PSA 0.4 (08/2019). Currently minimal symptoms with mild nocturia x 1, and some residual urine leakage, slightly weaker urine flow. No known family history of prostate CA.     Depression screen [facility] 2/9 09/20/2019 05/19/2019 06/28/2017  Decreased Interest 0 0 0  Down, Depressed, Hopeless 0 0 0  PHQ - 2 Score 0 0 0    Past Medical History:  Diagnosis Date  . Abnormal heart rhythm   . Colon cancer ([place])    sigmoid  . COPD (chronic obstructive pulmonary disease) (HCC)    MILD  . Dysrhythmia   . Hemorrhoid   . History of chemotherapy    FOLFOX  . Lung nodule    RIGHT/ NO PROGRESSION  . Mitral valve prolapse    Pt reports Dr. Rockey Situ told him NO MVP  . Rheumatoid arthritis [facility])    Past Surgical History:  Procedure Laterality Date  . AQUEOUS SHUNT Left 10/12/2016   Procedure: AQUEOUS SHUNT with scleral patch graft  Left;  Surgeon: Ronnell Freshwater, MD;  Location: [facility];  Service: Ophthalmology;  Laterality: Left;  tube shunt  . BIOPSY Left 10/12/2016   Procedure: BIOPSY (fresh & permanent specimens sent from same location)- conjunctiva left eye;  Surgeon: Ronnell Freshwater, MD;  Location: [facility];  Service: Ophthalmology;  Laterality: Left;  . CATARACT EXTRACTION W/PHACO Left 03/01/2018   Procedure: CATARACT EXTRACTION PHACO AND INTRAOCULAR LENS PLACEMENT (IOC);  Surgeon: Birder Robson, MD;  Location: [facility];  Service: Ophthalmology;  Laterality: Left;  [place] 01:49.2 AP% 18.6 CDE 20.32 Fluid Pack Lot # [id number]  . COLON SURGERY    . COLONOSCOPY  2013, 05/16/2008  . ESOPHAGOGASTRODUODENOSCOPY  06/04/08  . EYE SURGERY Left    TUMOR BIOPSY  .  PORT-A-CATH REMOVAL  2010  . PORTACATH PLACEMENT  06/2008   Social History   Socioeconomic History  . Marital status: Single    Spouse name: Not on file  . Number of children: Not on file  . Years of education: College  . Highest education level: Not on file  Occupational History  . Not on file  Tobacco Use  . Smoking status: Former Smoker    Packs/day: 1.00    Years: 24.00    Pack years: 24.00    Types: Cigarettes    Start date: 04/15/2008    Quit date:  07/27/2008    Years since quitting: 11.1  . Smokeless tobacco: Former Network engineer and Sexual Activity  . Alcohol use: Not Currently    Alcohol/week: 0.0 standard drinks    Comment: occasional alcohol use  . Drug use: Not Currently    Types: Marijuana    Comment: last used 1989  . Sexual activity: Not on file  Other Topics Concern  . Not on file  Social History Narrative   Retired. Single. Regularly exercises.    Social Determinants of Health   Financial Resource Strain:   . Difficulty of Paying Living Expenses: Not on file  Food Insecurity:   . Worried About Charity fundraiser in the Last Year: Not on file  . Ran Out of Food in the Last Year: Not on file  Transportation Needs:   . Lack of Transportation (Medical): Not on file  . Lack of Transportation (Non-Medical): Not on file  Physical Activity:   . Days of Exercise per Week: Not on file  . Minutes of Exercise per Session: Not on file  Stress:   . Feeling of Stress : Not on file  Social Connections:   . Frequency of Communication with Friends and Family: Not on file  . Frequency of Social Gatherings with Friends and Family: Not on file  . Attends Religious Services: Not on file  . Active Member of Clubs or Organizations: Not on file  . Attends Archivist Meetings: Not on file  . Marital Status: Not on file  Intimate Partner Violence:   . Fear of Current or Ex-Partner: Not on file  . Emotionally Abused: Not on file  . Physically Abused: Not on file  . Sexually Abused: Not on file   Family History  Problem Relation Age of Onset  . Arthritis Mother   . Colon polyps Mother   . Hypothyroidism Mother   . Heart murmur Father   . Valvular heart disease Father 18  . Colon cancer Maternal Grandfather 36  . Rheumatologic disease Maternal Aunt   . Prostate cancer Neg Hx    Current Outpatient Medications on File Prior to Visit  Medication Sig  . acetaminophen (TYLENOL) 500 MG tablet Take 1,000 mg by mouth  every 6 (six) hours as needed for moderate pain or headache.  . ALPHAGAN P 0.1 % SOLN INSTILL 1 DROP INTO LEFT EYE TWICE A DAY  . aspirin EC 81 MG tablet Take 81 mg by mouth daily.  . brimonidine (ALPHAGAN) 0.2 % ophthalmic solution Place 1 drop into the left eye 2 (two) times daily.   . Calcium Carb-Cholecalciferol (CALCIUM-VITAMIN D3) 600-500 MG-UNIT CAPS Take 1 tablet by mouth daily.  . Cholecalciferol (VITAMIN D) 2000 units CAPS Take 2,000 Units by mouth daily.   . ciclopirox (LOPROX) 0.77 % cream Apply topically 2 (two) times daily. For 2 weeks then as needed  . Difluprednate (DUREZOL) 0.05 %  EMUL Place 1 drop into the left eye 2 (two) times daily.  . Multiple Vitamins-Minerals (DAILY MULTI) TABS Take 1 tablet by mouth daily.    No current facility-administered medications on file prior to visit.    Review of Systems  Constitutional: Negative for activity change, appetite change, chills, diaphoresis, fatigue and fever.  HENT: Negative for congestion and hearing loss.   Eyes: Negative for visual disturbance.  Respiratory: Negative for apnea, cough, chest tightness, shortness of breath and wheezing.   Cardiovascular: Negative for chest pain, palpitations and leg swelling.  Gastrointestinal: Positive for constipation (occasional). Negative for abdominal pain, anal bleeding, blood in stool, diarrhea, nausea and vomiting.  Endocrine: Negative for cold intolerance.  Genitourinary: Negative for decreased urine volume, dysuria, frequency, hematuria, testicular pain and urgency.       Nocturia x 1, mild weak stream  Musculoskeletal: Negative for arthralgias, back pain and neck pain.  Skin: Positive for rash.  Allergic/Immunologic: Negative for environmental allergies.  Neurological: Negative for dizziness, weakness, light-headedness, numbness and headaches.  Hematological: Negative for adenopathy.  Psychiatric/Behavioral: Negative for behavioral problems, dysphoric mood and sleep disturbance.  The patient is not nervous/anxious.    Per HPI unless specifically indicated above      Objective:    BP 106/60 (BP Location: Left Arm, Cuff Size: Normal)   Pulse 71   Temp (!) 97.3 F (36.3 C) (Oral)   Resp 16   Ht 6' (1.829 m)   Wt 186 lb (84.4 kg)   BMI 25.23 kg/m   Wt Readings from Last 3 Encounters:  09/20/19 186 lb (84.4 kg)  05/19/19 174 lb (78.9 kg)  04/11/19 171 lb (77.6 kg)    Physical Exam Vitals and nursing note reviewed.  Constitutional:      General: He is not in acute distress.    Appearance: He is well-developed. He is not diaphoretic.     Comments: Well-appearing, comfortable, cooperative  HENT:     Head: Normocephalic and atraumatic.  Eyes:     General:        Right eye: No discharge.        Left eye: No discharge.     Conjunctiva/sclera: Conjunctivae normal.     Pupils: Pupils are equal, round, and reactive to light.  Neck:     Thyroid: No thyromegaly.     Vascular: No carotid bruit.  Cardiovascular:     Rate and Rhythm: Normal rate and regular rhythm.     Heart sounds: Normal heart sounds. No murmur.  Pulmonary:     Effort: Pulmonary effort is normal. No respiratory distress.     Breath sounds: Normal breath sounds. No wheezing or rales.  Abdominal:     General: Bowel sounds are normal. There is no distension.     Palpations: Abdomen is soft. There is no mass.     Tenderness: There is no abdominal tenderness.  Musculoskeletal:        General: No tenderness. Normal range of motion.     Cervical back: Normal range of motion and neck supple.     Comments: Upper / Lower Extremities: - Normal muscle tone, strength bilateral upper extremities 5/5, lower extremities 5/5  Lymphadenopathy:     Cervical: No cervical adenopathy.  Skin:    General: Skin is warm and dry.     Findings: Rash (left upper thigh leg, some dry scaly appearing patches consistent with eczema, and abdomen some maculopapular rash) present. No erythema.  Neurological:      Mental  Status: He is alert and oriented to person, place, and time.     Comments: Distal sensation intact to light touch all extremities  Psychiatric:        Behavior: Behavior normal.     Comments: Well groomed, good eye contact, normal speech and thoughts    Results for orders placed or performed in visit on 0000000  BASIC METABOLIC PANEL WITH GFR  Result Value Ref Range   Glucose, Bld 98 65 - 99 mg/dL   BUN 15 7 - 25 mg/dL   Creat 1.05 0.70 - 1.25 mg/dL   GFR, Est Non African American 77 > OR = 60 mL/min/1.55m2   GFR, Est African American 89 > OR = 60 mL/min/1.72m2   BUN/Creatinine Ratio NOT APPLICABLE 6 - 22 (calc)   Sodium 140 135 - 146 mmol/L   Potassium 4.4 3.5 - 5.3 mmol/L   Chloride 104 98 - 110 mmol/L   CO2 28 20 - 32 mmol/L   Calcium 9.2 8.6 - 10.3 mg/dL  HIV Antibody (routine testing w rflx)  Result Value Ref Range   HIV 1&2 Ab, 4th Generation NON-REACTIVE NON-REACTI  TSH  Result Value Ref Range   TSH 5.38 (H) 0.40 - 4.50 mIU/L  PSA  Result Value Ref Range   PSA 0.4 < OR = 4.0 ng/mL  Lipid panel  Result Value Ref Range   Cholesterol 160 <200 mg/dL   HDL 61 > OR = 40 mg/dL   Triglycerides 55 <150 mg/dL   LDL Cholesterol (Calc) 85 mg/dL (calc)   Total CHOL/HDL Ratio 2.6 <5.0 (calc)   Non-HDL Cholesterol (Calc) 99 <130 mg/dL (calc)  Hemoglobin A1c  Result Value Ref Range   Hgb A1c MFr Bld 5.3 <5.7 % of total Hgb   Mean Plasma Glucose 105 (calc)   eAG (mmol/L) 5.8 (calc)      Assessment & Plan:   Problem List Items Addressed This Visit    Rheumatoid arthritis (HCC)   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     Follow up plan: Return in about 1 year (around 09/19/2020) for Annual Physical.  Nobie Putnam, DO [facility] 09/20/2019, 9:14 AM

## 2019-09-20 NOTE — Patient Instructions (Addendum)
Thank you for coming to the office today.  1. Chemistry - Normal results, including electrolytes, kidney function. Normal fasting blood sugar   2. Hemoglobin A1c (Diabetes screening) - 5.3, normal not in range of Pre-Diabetes (>5.7 to 6.4)   3. Routine screening HIV - Negative.  4. PSA Prostate Cancer Screening - 0.4, negative.  5. TSH Thyroid Function Tests - Mild elevated TSH, means thyroid hormone is slightly low  6. Cholesterol - Normal cholesterol results.   -----------------------------------------------------------------  Good options to eat for helping boost thyroid  eggs: whole eggs are best, as much of their iodine and selenium are found in the yolk, while the whites are full of protein meat: all meats, including lamb, beef, chicken, etc. fish: all seafood, including salmon, tuna, halibut, shrimp, etc. vegetables: all vegetables -- cruciferous vegetables are fine to eat in moderate amounts, especially when cooked fruits: all other fruits, including berries, bananas, oranges, tomatoes, etc. gluten-free grains and seeds: rice, buckwheat, quinoa, chia seeds, and flax seeds dairy: all dairy products, including milk, cheese, yogurt, etc. beverages: water and other non-caffeinated beverages   DUE for FASTING BLOOD WORK (no food or drink after midnight before the lab appointment, only water or coffee without cream/sugar on the morning of)  SCHEDULE "Lab Only" visit in the morning at the clinic for lab draw in 1 YEAR  - Make sure Lab Only appointment is at about 1 week before your next appointment, so that results will be available  For Lab Results, once available within 2-3 days of blood draw, you can can log in to MyChart online to view your results and a brief explanation. Also, we can discuss results at next follow-up visit.   Please schedule a Follow-up Appointment to: Return in about 1 year (around 09/19/2020) for Annual Physical.  If you have any other questions  or concerns, please feel free to call the office or send a message through [facility]. You may also schedule an earlier appointment if necessary.  Additionally, you may be receiving a survey about your experience at our office within a few days to 1 week by e-mail or mail. We value your feedback.  Nobie Putnam, DO [facility]

## 2019-11-20 ENCOUNTER — Inpatient Hospital Stay: Payer: Managed Care, Other (non HMO) | Attending: Internal Medicine

## 2019-11-20 ENCOUNTER — Other Ambulatory Visit: Payer: Self-pay

## 2019-11-20 ENCOUNTER — Inpatient Hospital Stay (HOSPITAL_BASED_OUTPATIENT_CLINIC_OR_DEPARTMENT_OTHER): Payer: Managed Care, Other (non HMO) | Admitting: Internal Medicine

## 2019-11-20 DIAGNOSIS — Z8572 Personal history of non-Hodgkin lymphomas: Secondary | ICD-10-CM | POA: Insufficient documentation

## 2019-11-20 DIAGNOSIS — M069 Rheumatoid arthritis, unspecified: Secondary | ICD-10-CM | POA: Insufficient documentation

## 2019-11-20 DIAGNOSIS — Z79899 Other long term (current) drug therapy: Secondary | ICD-10-CM | POA: Insufficient documentation

## 2019-11-20 DIAGNOSIS — Z7982 Long term (current) use of aspirin: Secondary | ICD-10-CM | POA: Diagnosis not present

## 2019-11-20 DIAGNOSIS — C884 Extranodal marginal zone B-cell lymphoma of mucosa-associated lymphoid tissue [MALT-lymphoma]: Secondary | ICD-10-CM

## 2019-11-20 DIAGNOSIS — Z87891 Personal history of nicotine dependence: Secondary | ICD-10-CM | POA: Diagnosis not present

## 2019-11-20 DIAGNOSIS — J449 Chronic obstructive pulmonary disease, unspecified: Secondary | ICD-10-CM | POA: Insufficient documentation

## 2019-11-20 DIAGNOSIS — Z85038 Personal history of other malignant neoplasm of large intestine: Secondary | ICD-10-CM | POA: Diagnosis not present

## 2019-11-20 DIAGNOSIS — Z800 Family history of malignant neoplasm of digestive organs: Secondary | ICD-10-CM | POA: Insufficient documentation

## 2019-11-20 DIAGNOSIS — C187 Malignant neoplasm of sigmoid colon: Secondary | ICD-10-CM

## 2019-11-20 DIAGNOSIS — Z923 Personal history of irradiation: Secondary | ICD-10-CM | POA: Insufficient documentation

## 2019-11-20 LAB — CBC WITH DIFFERENTIAL/PLATELET
Abs Immature Granulocytes: 0.02 10*3/uL (ref 0.00–0.07)
Basophils Absolute: 0 10*3/uL (ref 0.0–0.1)
Basophils Relative: 0 %
Eosinophils Absolute: 0.3 10*3/uL (ref 0.0–0.5)
Eosinophils Relative: 4 %
HCT: 44.6 % (ref 39.0–52.0)
Hemoglobin: 15.7 g/dL (ref 13.0–17.0)
Immature Granulocytes: 0 %
Lymphocytes Relative: 20 %
Lymphs Abs: 1.5 10*3/uL (ref 0.7–4.0)
MCH: 29.9 pg (ref 26.0–34.0)
MCHC: 35.2 g/dL (ref 30.0–36.0)
MCV: 85 fL (ref 80.0–100.0)
Monocytes Absolute: 0.8 10*3/uL (ref 0.1–1.0)
Monocytes Relative: 10 %
Neutro Abs: 5.2 10*3/uL (ref 1.7–7.7)
Neutrophils Relative %: 66 %
Platelets: 234 10*3/uL (ref 150–400)
RBC: 5.25 MIL/uL (ref 4.22–5.81)
RDW: 12.9 % (ref 11.5–15.5)
WBC: 7.9 10*3/uL (ref 4.0–10.5)
nRBC: 0 % (ref 0.0–0.2)

## 2019-11-20 LAB — COMPREHENSIVE METABOLIC PANEL
ALT: 23 U/L (ref 0–44)
AST: 23 U/L (ref 15–41)
Albumin: 4 g/dL (ref 3.5–5.0)
Alkaline Phosphatase: 58 U/L (ref 38–126)
Anion gap: 10 (ref 5–15)
BUN: 15 mg/dL (ref 8–23)
CO2: 25 mmol/L (ref 22–32)
Calcium: 9 mg/dL (ref 8.9–10.3)
Chloride: 103 mmol/L (ref 98–111)
Creatinine, Ser: 1.02 mg/dL (ref 0.61–1.24)
GFR calc Af Amer: >60 mL/min (ref >60)
GFR calc non Af Amer: >60 mL/min (ref >60)
Glucose, Bld: 100 mg/dL — ABNORMAL HIGH (ref 70–99)
Potassium: 4 mmol/L (ref 3.5–5.1)
Sodium: 138 mmol/L (ref 135–145)
Total Bilirubin: 0.8 mg/dL (ref 0.3–1.2)
Total Protein: 7.4 g/dL (ref 6.5–8.1)

## 2019-11-20 LAB — LACTATE DEHYDROGENASE: LDH: 95 U/L — ABNORMAL LOW (ref 98–192)

## 2019-11-20 NOTE — Assessment & Plan Note (Addendum)
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 S/p RT [jan 15th 2019].  Stable.    # Clinically no evidence of recurrence.;  Continue surveillance without imaging.  Ultrasound [facility]- March 2021- WNL.   # Left eye discomfort/tearing-elated to allergies/underlying uveitis/scleritis.  Stable followed by ophthalmology at [facility].  #Stage III colon cancer-at 65 years;  NED; stable [Maternal grandfather with colon cancer at age of 78. discuss at next visit.[Brother- in [place]; pt no children]-declines genetic counseling  #Disposition : # Follow up in 12 months-MD;labs-cbc/cmp/cea- Dr.B  Cc; Dr.Karamgeglos

## 2019-11-21 LAB — CEA: CEA: 2.2 ng/mL (ref 0.0–4.7)

## 2019-11-26 NOTE — Progress Notes (Signed)
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 On Mxt [summer 2017]  # Lung nodule [incidental on CT]; last Nov 2014- 4m RML- STABLE [no further fu]  # ? 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  [facility] the [facility] had ultrasound of the eye done in March 2021.   Patient continues to have chronic discomfort in his eyes.  Not any worse.  No new lumps or bumps.  Review of Systems  Constitutional: Negative for chills, diaphoresis, fever, malaise/fatigue and weight loss.  HENT: Negative for nosebleeds and sore throat.   Eyes: Negative for double vision.       Left eye chronic poor vision/runny eyes.  Respiratory: Negative for cough, hemoptysis, sputum production, shortness of breath and wheezing.   Cardiovascular: Negative for chest pain, palpitations, orthopnea and leg  swelling.  Gastrointestinal: Negative for abdominal pain, blood in stool, constipation, diarrhea, heartburn, melena, nausea and vomiting.  Genitourinary: Negative for dysuria, frequency and urgency.  Musculoskeletal: Negative for back pain and joint pain.  Skin: Negative.  Negative for itching and rash.  Neurological: Negative for dizziness, tingling, focal weakness, weakness and headaches.  Endo/Heme/Allergies: Does not bruise/bleed easily.  Psychiatric/Behavioral: Negative for depression. The patient is not nervous/anxious and does not have insomnia.      PAST MEDICAL HISTORY :  Past Medical History:  Diagnosis Date  . Abnormal heart rhythm   . Colon cancer ([place]    sigmoid  . COPD (chronic obstructive pulmonary disease) (HCC)    MILD  . Dysrhythmia   . Hemorrhoid   . History of chemotherapy    FOLFOX  . Lung nodule    RIGHT/ NO PROGRESSION  . Mitral valve prolapse    Pt reports Dr. GRockey Situtold him NO MVP  . Rheumatoid arthritis ([place]     PAST SURGICAL HISTORY :   Past Surgical History:  Procedure Laterality Date  . AQUEOUS SHUNT Left 10/12/2016   Procedure: AQUEOUS SHUNT with scleral patch graft  Left;  Surgeon: ARonnell Freshwater MD;  [place]  Service: Ophthalmology;  Laterality: Left;  tube shunt  . BIOPSY Left 10/12/2016   Procedure: BIOPSY (fresh & permanent specimens sent from same location)- conjunctiva left eye;  Surgeon: ARonnell Freshwater MD;  [place] [facility]  Service: Ophthalmology;  Laterality: Left;  . CATARACT EXTRACTION W/PHACO Left 03/01/2018   Procedure: CATARACT EXTRACTION  PHACO AND INTRAOCULAR LENS PLACEMENT (IOC);  Surgeon: Birder Robson, MD;  Location: [facility];  Service: Ophthalmology;  Laterality: Left;  [place] 01:49.2 AP% 18.6 CDE 20.32 Fluid Pack Lot # [id number] H  . COLON SURGERY    . COLONOSCOPY  2013, 05/16/2008  . ESOPHAGOGASTRODUODENOSCOPY  06/04/08  . EYE SURGERY Left    TUMOR BIOPSY  .  PORT-A-CATH REMOVAL  2010  . PORTACATH PLACEMENT  06/2008    FAMILY HISTORY :   Family History  Problem Relation Age of Onset  . Arthritis Mother   . Colon polyps Mother   . Hypothyroidism Mother   . Heart murmur Father   . Valvular heart disease Father 33  . Colon cancer Maternal Grandfather 19  . Rheumatologic disease Maternal Aunt   . Prostate cancer Neg Hx     SOCIAL HISTORY:   Social History   Tobacco Use  . Smoking status: Former Smoker    Packs/day: 1.00    Years: 24.00    Pack years: 24.00    Types: Cigarettes    Start date: 04/15/2008    Quit date: 07/27/2008    Years since quitting: 11.3  . Smokeless tobacco: Former Network engineer Use Topics  . Alcohol use: Not Currently    Alcohol/week: 0.0 standard drinks    Comment: occasional alcohol use  . Drug use: Not Currently    Types: Marijuana    Comment: last used 1989    ALLERGIES:  is allergic to lumigan [bimatoprost] and thorazine [chlorpromazine].  MEDICATIONS:  Current Outpatient Medications  Medication Sig Dispense Refill  . ALPHAGAN P 0.1 % SOLN INSTILL 1 DROP INTO LEFT EYE TWICE A DAY    . aspirin EC 81 MG tablet Take 81 mg by mouth daily.    . Calcium Carb-Cholecalciferol (CALCIUM-VITAMIN D3) 600-500 MG-UNIT CAPS Take 1 tablet by mouth daily.    . Cholecalciferol (VITAMIN D) 2000 units CAPS Take 2,000 Units by mouth daily.     . Difluprednate (DUREZOL) 0.05 % EMUL Place 1 drop into the left eye 2 (two) times daily.    . Multiple Vitamins-Minerals (DAILY MULTI) TABS Take 1 tablet by mouth daily.     Marland Kitchen acetaminophen (TYLENOL) 500 MG tablet Take 1,000 mg by mouth every 6 (six) hours as needed for moderate pain or headache.    . brimonidine (ALPHAGAN) 0.2 % ophthalmic solution Place 1 drop into the left eye 2 (two) times daily.     . ciclopirox (LOPROX) 0.77 % cream Apply topically 2 (two) times daily. For 2 weeks then as needed (Patient not taking: Reported on 11/20/2019) 30 g 2   No current  facility-administered medications for this visit.    PHYSICAL EXAMINATION: ECOG PERFORMANCE STATUS: 0 - Asymptomatic  BP 122/82 (BP Location: Right Arm, Patient Position: Sitting, Cuff Size: Normal)   Pulse 61   Temp (!) 97.2 F (36.2 C) (Tympanic)   Wt 186 lb (84.4 kg)   BMI 25.23 kg/m   Filed Weights   11/20/19 1124  Weight: 186 lb (84.4 kg)   Physical Exam  Constitutional: He is oriented to person, place, and time and well-developed, well-nourished, and in no distress.  Patient is alone.  HENT:  Head: Normocephalic and atraumatic.  Mouth/Throat: Oropharynx is clear and moist. No oropharyngeal exudate.  No obvious masses felt in the neck.  Eyes: Pupils are equal, round, and reactive to light.  Cardiovascular: Normal rate and regular rhythm.  Pulmonary/Chest: No respiratory distress. He has no wheezes.  Abdominal: Soft.  Bowel sounds are normal. He exhibits no distension and no mass. There is no abdominal tenderness. There is no rebound and no guarding.  Musculoskeletal:        General: No tenderness or edema. Normal range of motion.     Cervical back: Normal range of motion and neck supple.  Neurological: He is alert and oriented to person, place, and time.  Skin: Skin is warm.  Psychiatric: Affect normal.     LABORATORY DATA:  I have reviewed the data as listed    Component Value Date/Time   NA 138 11/20/2019 1017   NA 138 05/03/2014 1041   K 4.0 11/20/2019 1017   K 3.8 05/03/2014 1041   CL 103 11/20/2019 1017   CL 103 05/03/2014 1041   CO2 25 11/20/2019 1017   CO2 30 05/03/2014 1041   GLUCOSE 100 (H) 11/20/2019 1017   GLUCOSE 102 (H) 05/03/2014 1041   BUN 15 11/20/2019 1017   BUN 15 05/03/2014 1041   CREATININE 1.02 11/20/2019 1017   CREATININE 1.05 09/13/2019 0822   CALCIUM 9.0 11/20/2019 1017   CALCIUM 9.2 05/03/2014 1041   PROT 7.4 11/20/2019 1017   PROT 7.5 05/03/2014 1041   ALBUMIN 4.0 11/20/2019 1017   ALBUMIN 3.6 05/03/2014 1041   AST 23  11/20/2019 1017   AST 19 05/03/2014 1041   ALT 23 11/20/2019 1017   ALT 20 05/03/2014 1041   ALKPHOS 58 11/20/2019 1017   ALKPHOS 52 05/03/2014 1041   BILITOT 0.8 11/20/2019 1017   BILITOT 0.7 05/03/2014 1041   GFRNONAA >60 11/20/2019 1017   GFRNONAA 77 09/13/2019 0822   GFRAA >60 11/20/2019 1017   GFRAA 89 09/13/2019 0822    No results found for: SPEP, UPEP  Lab Results  Component Value Date   WBC 7.9 11/20/2019   NEUTROABS 5.2 11/20/2019   HGB 15.7 11/20/2019   HCT 44.6 11/20/2019   MCV 85.0 11/20/2019   PLT 234 11/20/2019      Chemistry      Component Value Date/Time   NA 138 11/20/2019 1017   NA 138 05/03/2014 1041   K 4.0 11/20/2019 1017   K 3.8 05/03/2014 1041   CL 103 11/20/2019 1017   CL 103 05/03/2014 1041   CO2 25 11/20/2019 1017   CO2 30 05/03/2014 1041   BUN 15 11/20/2019 1017   BUN 15 05/03/2014 1041   CREATININE 1.02 11/20/2019 1017   CREATININE 1.05 09/13/2019 0822      Component Value Date/Time   CALCIUM 9.0 11/20/2019 1017   CALCIUM 9.2 05/03/2014 1041   ALKPHOS 58 11/20/2019 1017   ALKPHOS 52 05/03/2014 1041   AST 23 11/20/2019 1017   AST 19 05/03/2014 1041   ALT 23 11/20/2019 1017   ALT 20 05/03/2014 1041   BILITOT 0.8 11/20/2019 1017   BILITOT 0.7 05/03/2014 1041      ASSESSMENT & PLAN:  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 S/p RT [jan 15th 2019].  Stable.    # Clinically no evidence of recurrence.;  Continue surveillance without imaging.  Ultrasound [facility]- March 2021- WNL.   # Left eye discomfort/tearing-elated to allergies/underlying uveitis/scleritis.  Stable [facility]mology [facility].  #Stage III colon cancer-at 67 years;  NED; stable [Maternal grandfather with colon cancer at age of 6. discuss at next visit.[Brother- in [place]; pt no children]-declines genetic counseling  #Disposition : # Follow  up in 12 months-MD;labs-cbc/cmp/cea- Dr.B  Cc; Dr.Karamgeglos     Cammie Sickle, MD 11/26/2019 11:59 PM

## 2019-12-05 ENCOUNTER — Encounter: Payer: Self-pay | Admitting: Family Medicine

## 2019-12-05 ENCOUNTER — Other Ambulatory Visit: Payer: Self-pay

## 2019-12-05 ENCOUNTER — Ambulatory Visit (INDEPENDENT_AMBULATORY_CARE_PROVIDER_SITE_OTHER): Payer: Managed Care, Other (non HMO) | Admitting: Family Medicine

## 2019-12-05 VITALS — BP 103/64 | HR 60 | Temp 97.3°F | Resp 16 | Ht 72.0 in | Wt 187.6 lb

## 2019-12-05 DIAGNOSIS — H6123 Impacted cerumen, bilateral: Secondary | ICD-10-CM

## 2019-12-05 NOTE — Patient Instructions (Addendum)
Thank you for coming to the office today.  You have thick impacted ear wax (cerumen) blocking ear canals and ear drums. This is the most likely cause of reduced hearing and ear pain and discomfort. - We were able to remove almost all of the ear wax with flushing in the office today  - Recommend to prevent ear wax builds up again, over the counter Debrox (Carbamide peroxide), use on both sides following instructions on bottle, pharmacist will direct you to the appropriate ear drops if you need help. May take a week or more.  Avoid using Q-tips inside ears, as this can push wax deeper, but you can try to use rolled up kleenex as a wick to absorb fluid and wax as well.    Please schedule a Follow-up Appointment to: Return if symptoms worsen or fail to improve.  If you have any other questions or concerns, please feel free to call the office or send a message through [place]. You may also schedule an earlier appointment if necessary.  Additionally, you may be receiving a survey about your experience at our office within a few days to 1 week by e-mail or mail. We value your feedback.  Nobie Putnam, DO [place]

## 2019-12-05 NOTE — Progress Notes (Signed)
Subjective:    Patient ID: Benjamin Clarke, male    DOB: 11/07/1960, 59 y.o.   MRN: [id number]  Benjamin Clarke is a 59 y.o. male presenting on 12/05/2019 for Cerumen Impaction   HPI   Bilateral Cerumen Impaction, Reduced Hearing and Pressure Reports gradual worsening in past 2+ months with bilateral ear fullness and pressure and blocked with ear wax, similar problem in past required flushing years ago. Denies any ear pain or headache, ear drainage, fever chills congestion cough   Depression screen [facility] 2/9 09/20/2019 05/19/2019 06/28/2017  Decreased Interest 0 0 0  Down, Depressed, Hopeless 0 0 0  PHQ - 2 Score 0 0 0    Social History   Tobacco Use  . Smoking status: Former Smoker    Packs/day: 1.00    Years: 24.00    Pack years: 24.00    Types: Cigarettes    Start date: 04/15/2008    Quit date: 07/27/2008    Years since quitting: 11.3  . Smokeless tobacco: Former Network engineer Use Topics  . Alcohol use: Not Currently    Alcohol/week: 0.0 standard drinks    Comment: occasional alcohol use  . Drug use: Not Currently    Types: Marijuana    Comment: last used 1989    Review of Systems Per HPI unless specifically indicated above     Objective:    BP 103/64   Pulse 60   Temp (!) 97.3 F (36.3 C) (Temporal)   Resp 16   Ht 6' (1.829 m)   Wt 187 lb 9.6 oz (85.1 kg)   BMI 25.44 kg/m   Wt Readings from Last 3 Encounters:  12/05/19 187 lb 9.6 oz (85.1 kg)  11/20/19 186 lb (84.4 kg)  09/20/19 186 lb (84.4 kg)    Physical Exam Vitals and nursing note reviewed.  Constitutional:      General: He is not in acute distress.    Appearance: He is well-developed. He is not diaphoretic.     Comments: Well-appearing, comfortable, cooperative  HENT:     Head: Normocephalic and atraumatic.     Comments: Bilateral ears with impacted cerumen, unable to view TMs Eyes:     General:        Right eye: No discharge.        Left eye: No discharge.     Conjunctiva/sclera:  Conjunctivae normal.  Cardiovascular:     Rate and Rhythm: Normal rate.  Pulmonary:     Effort: Pulmonary effort is normal.  Skin:    General: Skin is warm and dry.     Findings: No erythema or rash.  Neurological:     Mental Status: He is alert and oriented to person, place, and time.  Psychiatric:        Behavior: Behavior normal.     Comments: Well groomed, good eye contact, normal speech and thoughts     ________________________________________________________ PROCEDURE NOTE Date: 12/05/19 Bilateral Ear Lavage / Cerumen Removal Discussed benefits and risks (including pain / discomforts, dizziness, minor abrasion of ear canal). Verbal consent given by patient. Medication:  carbamide peroxide ear drops, Ear Lavage Solution (warm water + hydrogen peroxide) Performed by Dr Parks Ranger Several drops of carbamide peroxide placed in each ear, allowed to sit for few minutes. Ear lavage solution flushed into one ear at a time in attempt to dislodge and remove ear wax. Results successful with easily removed large cerumen bilateral.  Repeat Ear Exam: - Completely removed cerumen now, with clear  ear canals and visible TMs clear and normal appearance. R TM has some slight area of mild irritation or erythema from where cerumen was impacted.   Results for orders placed or performed in visit on 11/20/19  Lactate dehydrogenase  Result Value Ref Range   LDH 95 (L) 98 - 192 U/L  CEA  Result Value Ref Range   CEA 2.2 0.0 - 4.7 ng/mL  Comprehensive metabolic panel  Result Value Ref Range   Sodium 138 135 - 145 mmol/L   Potassium 4.0 3.5 - 5.1 mmol/L   Chloride 103 98 - 111 mmol/L   CO2 25 22 - 32 mmol/L   Glucose, Bld 100 (H) 70 - 99 mg/dL   BUN 15 8 - 23 mg/dL   Creatinine, Ser 1.02 0.61 - 1.24 mg/dL   Calcium 9.0 8.9 - 10.3 mg/dL   Total Protein 7.4 6.5 - 8.1 g/dL   Albumin 4.0 3.5 - 5.0 g/dL   AST 23 15 - 41 U/L   ALT 23 0 - 44 U/L   Alkaline Phosphatase 58 38 - 126 U/L   Total  Bilirubin 0.8 0.3 - 1.2 mg/dL   GFR calc non Af Amer >60 >60 mL/min   GFR calc Af Amer >60 >60 mL/min   Anion gap 10 5 - 15  CBC with Differential  Result Value Ref Range   WBC 7.9 4.0 - 10.5 K/uL   RBC 5.25 4.22 - 5.81 MIL/uL   Hemoglobin 15.7 13.0 - 17.0 g/dL   HCT 44.6 39.0 - 52.0 %   MCV 85.0 80.0 - 100.0 fL   MCH 29.9 26.0 - 34.0 pg   MCHC 35.2 30.0 - 36.0 g/dL   RDW 12.9 11.5 - 15.5 %   Platelets 234 150 - 400 K/uL   nRBC 0.0 0.0 - 0.2 %   Neutrophils Relative % 66 %   Neutro Abs 5.2 1.7 - 7.7 K/uL   Lymphocytes Relative 20 %   Lymphs Abs 1.5 0.7 - 4.0 K/uL   Monocytes Relative 10 %   Monocytes Absolute 0.8 0.1 - 1.0 K/uL   Eosinophils Relative 4 %   Eosinophils Absolute 0.3 0.0 - 0.5 K/uL   Basophils Relative 0 %   Basophils Absolute 0.0 0.0 - 0.1 K/uL   Immature Granulocytes 0 %   Abs Immature Granulocytes 0.02 0.00 - 0.07 K/uL      Assessment & Plan:   Problem List Items Addressed This Visit    None    Visit Diagnoses    Bilateral impacted cerumen    -  Primary   Bilateral hearing loss due to cerumen impaction          Significant amount of large thick impacted cerumen bilaterally, suspected primary cause of current reduced bilateral hearing  Plan: 1. Successful office ear lavage cerumen removal today, re-evaluated with clear ear canals and normal TMs 2. Counseled on avoiding Q-tips and may use Kleenex as wick, use OTC Debrox as needed 3. Follow-up as needed    No orders of the defined types were placed in this encounter.     Follow up plan: Return if symptoms worsen or fail to improve.   Nobie Putnam, DO [facility] 12/05/2019, 10:04 AM

## 2020-05-02 ENCOUNTER — Ambulatory Visit (INDEPENDENT_AMBULATORY_CARE_PROVIDER_SITE_OTHER): Payer: Managed Care, Other (non HMO) | Admitting: Family Medicine

## 2020-05-02 ENCOUNTER — Other Ambulatory Visit: Payer: Self-pay

## 2020-05-02 ENCOUNTER — Encounter: Payer: Self-pay | Admitting: Family Medicine

## 2020-05-02 VITALS — BP 114/74 | HR 57 | Temp 97.3°F | Resp 16 | Ht 72.0 in | Wt 179.0 lb

## 2020-05-02 DIAGNOSIS — K4090 Unilateral inguinal hernia, without obstruction or gangrene, not specified as recurrent: Secondary | ICD-10-CM | POA: Diagnosis not present

## 2020-05-02 NOTE — Patient Instructions (Addendum)
Thank you for coming to the office today.  We can refer to General Surgery within 3-6 months for consultation if you like.  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  This is caused by a weakness in your abdominal or groin muscles, and is caused by bowel or fatty tissue pushing through this weak spot causing pain and bulging.  Recommend a "Hernia Truss", try to wear this regularly (do not need to wear to bed if pain is improved), until it feels better and then only with activities - If it is not improving then you may need to wear it every day, especially if you cannot have a surgery to fix the hernia  You can find a Truss OTC at [facility]  Try to find positions that give you most relief, likely laying down with head lower than body will allow the hernia bulging to go back into place and feel better.  May take Tylenol as needed. Recommend to start taking Tylenol Extra Strength 500mg  tabs - take 1 to 2 tabs per dose (max 1000mg ) every 6-8 hours for pain (take regularly, don't skip a dose for next 7 days), max 24 hour daily dose is 6 tablets or 3000mg . In the future you can repeat the same everyday Tylenol course for 1-2 weeks at a time.   Can try topical ice packs or muscle rub if burning nerve sensation.  If significant worsening pain or you get bulging that does NOT go down or go away and CANNOT push back in, or nausea, vomiting, then it is very important to go directly to hospital ED for more immediate evaluation, as this can be a life threatening surgical emergency  For Constipation (less frequent bowel movement that can be hard dry or involve straining).  Recommend trying OTC Miralax 17g = 1 capful in large glass water once daily for now, try several days to see if working, goal is soft stool or BM 1-2 times daily, if too loose then reduce dose or try every other day. If not effective may need to increase it to 2 doses at once in AM or may do 1 in morning and 1 in afternoon/evening  -  This medicine is very safe and can be used often without any problem and will not make you dehydrated. It is good for use on AS NEEDED BASIS or even MAINTENANCE therapy for longer term for several days to weeks at a time to help regulate bowel movements  Other more natural remedies or preventative treatment: - Increase hydration with water - Increase fiber in diet (high fiber foods = vegetables, leafy greens, oats/grains) - May take OTC Fiber supplement (metamucil powder or pill/gummy) - May try OTC Probiotic   Please schedule a Follow-up Appointment to: Return if symptoms worsen or fail to improve, for hernia.  If you have any other questions or concerns, please feel free to call the office or send a message through [place]. You may also schedule an earlier appointment if necessary.  Additionally, you may be receiving a survey about your experience at our office within a few days to 1 week by e-mail or mail. We value your feedback.  Benjamin Putnam, DO [place]

## 2020-05-02 NOTE — Progress Notes (Signed)
Subjective:    Patient ID: Benjamin Clarke, male    DOB: 1960/11/03, 59 y.o.   MRN: [id number]  Benjamin Clarke is a 59 y.o. male presenting on 05/02/2020 for Hernia (as per patient left side possible hernia--onset 2 weeks)   HPI   Left Hernia, inguinal  Reports identified area of fullness in groin LEFT side, worse with standing, if lay down flat it goes down and seems flat, he does not do much heavy lifting but occasionally he may lift, he has no pain in this area. One episode of heavy lifting with a waste bin in past 6-12 months, but no major injury from it - Admits occasional constipation, with increased abdominal pressure and discomfort - Former smoker since 2009, no significant cough Denies any abdominal or testicular pain, redness, fever chills, nausea vomiting  Depression screen [facility] 2/9 09/20/2019 05/19/2019 06/28/2017  Decreased Interest 0 0 0  Down, Depressed, Hopeless 0 0 0  PHQ - 2 Score 0 0 0    Social History   Tobacco Use  . Smoking status: Former Smoker    Packs/day: 1.00    Years: 24.00    Pack years: 24.00    Types: Cigarettes    Start date: 04/15/2008    Quit date: 07/27/2008    Years since quitting: 11.7  . Smokeless tobacco: Former Network engineer Use Topics  . Alcohol use: Not Currently    Alcohol/week: 0.0 standard drinks    Comment: occasional alcohol use  . Drug use: Not Currently    Types: Marijuana    Comment: last used 1989    Review of Systems Per HPI unless specifically indicated above     Objective:    BP 114/74   Pulse (!) 57   Temp (!) 97.3 F (36.3 C) (Temporal)   Resp 16   Ht 6' (1.829 m)   Wt 179 lb (81.2 kg)   SpO2 98%   BMI 24.28 kg/m   Wt Readings from Last 3 Encounters:  05/02/20 179 lb (81.2 kg)  12/05/19 187 lb 9.6 oz (85.1 kg)  11/20/19 186 lb (84.4 kg)    Physical Exam Vitals and nursing note reviewed.  Constitutional:      General: He is not in acute distress.    Appearance: He is well-developed. He is not  diaphoretic.     Comments: Well-appearing, comfortable, cooperative  HENT:     Head: Normocephalic and atraumatic.  Eyes:     General:        Right eye: No discharge.        Left eye: No discharge.     Conjunctiva/sclera: Conjunctivae normal.  Cardiovascular:     Rate and Rhythm: Normal rate.  Pulmonary:     Effort: Pulmonary effort is normal.  Genitourinary:    Penis: Normal.      Testes: Normal.     Comments: Normal external genital exam. Significant LEFT inguinal hernia exam with large hernia bulging at rest with asymmetry and with provoked herniation on valsalva / cough standing and supine sitting up. Non tender. Skin:    General: Skin is warm and dry.     Findings: No erythema or rash.  Neurological:     Mental Status: He is alert and oriented to person, place, and time.  Psychiatric:        Behavior: Behavior normal.     Comments: Well groomed, good eye contact, normal speech and thoughts    Results for orders placed or performed  in visit on 11/20/19  Lactate dehydrogenase  Result Value Ref Range   LDH 95 (L) 98 - 192 U/L  CEA  Result Value Ref Range   CEA 2.2 0.0 - 4.7 ng/mL  Comprehensive metabolic panel  Result Value Ref Range   Sodium 138 135 - 145 mmol/L   Potassium 4.0 3.5 - 5.1 mmol/L   Chloride 103 98 - 111 mmol/L   CO2 25 22 - 32 mmol/L   Glucose, Bld 100 (H) 70 - 99 mg/dL   BUN 15 8 - 23 mg/dL   Creatinine, Ser 1.02 0.61 - 1.24 mg/dL   Calcium 9.0 8.9 - 10.3 mg/dL   Total Protein 7.4 6.5 - 8.1 g/dL   Albumin 4.0 3.5 - 5.0 g/dL   AST 23 15 - 41 U/L   ALT 23 0 - 44 U/L   Alkaline Phosphatase 58 38 - 126 U/L   Total Bilirubin 0.8 0.3 - 1.2 mg/dL   GFR calc non Af Amer >60 >60 mL/min   GFR calc Af Amer >60 >60 mL/min   Anion gap 10 5 - 15  CBC with Differential  Result Value Ref Range   WBC 7.9 4.0 - 10.5 K/uL   RBC 5.25 4.22 - 5.81 MIL/uL   Hemoglobin 15.7 13.0 - 17.0 g/dL   HCT 44.6 39 - 52 %   MCV 85.0 80.0 - 100.0 fL   MCH 29.9 26.0 - 34.0  pg   MCHC 35.2 30.0 - 36.0 g/dL   RDW 12.9 11.5 - 15.5 %   Platelets 234 150 - 400 K/uL   nRBC 0.0 0.0 - 0.2 %   Neutrophils Relative % 66 %   Neutro Abs 5.2 1.7 - 7.7 K/uL   Lymphocytes Relative 20 %   Lymphs Abs 1.5 0.7 - 4.0 K/uL   Monocytes Relative 10 %   Monocytes Absolute 0.8 0.1 - 1.0 K/uL   Eosinophils Relative 4 %   Eosinophils Absolute 0.3 0 - 0 K/uL   Basophils Relative 0 %   Basophils Absolute 0.0 0 - 0 K/uL   Immature Granulocytes 0 %   Abs Immature Granulocytes 0.02 0.00 - 0.07 K/uL      Assessment & Plan:   Problem List Items Addressed This Visit    None    Visit Diagnoses    Left inguinal hernia    -  Primary      Consistent with acute now increased large Left inguinal hernia - Seems to be stable currently but has worsened to this point, it is easily reducible without evidence of incarceration. - No acute red flag symptoms (without nausea vomiting, bowel obstruction, systemic illness, uncontrolled pain) No prior surgical repair of hernia. He has known complex abdominal /colon surgery from history colon cancer  Plan: 1. Reassurance given with reducible hernia at this time and otherwise asymptomatic '2. Recommend conservative therapy limit provoking activities and avoid heavy lifting, miralax for constipation, hernia support truss / strap as needed or more regularly if worsening, can use Tylenol PRN 3. Strict return criteria and when to go to hospital ED for more acute evaluation if any significant worsening, constant pain, systemic symptoms, or potential incarceration bulge that does not reduce  Offered General Surgery referral, he wishes to monitor it under surveillance for now, 3-6 months reconsider consultation or if doing well may wait longer, he is aware of the risks and precautions.     No orders of the defined types were placed in this encounter.  Follow up plan: Return if symptoms worsen or fail to improve, for hernia.   Nobie Putnam, [facility] 05/02/2020, 11:29 AM

## 2020-05-20 ENCOUNTER — Encounter: Payer: Self-pay | Admitting: Physician Assistant

## 2020-05-20 ENCOUNTER — Other Ambulatory Visit: Payer: Self-pay

## 2020-05-20 ENCOUNTER — Encounter: Payer: Self-pay | Admitting: Cardiovascular Disease

## 2020-05-20 ENCOUNTER — Ambulatory Visit (INDEPENDENT_AMBULATORY_CARE_PROVIDER_SITE_OTHER): Payer: Managed Care, Other (non HMO) | Admitting: Physician Assistant

## 2020-05-20 VITALS — BP 118/80 | HR 53 | Ht 72.0 in | Wt 178.0 lb

## 2020-05-20 DIAGNOSIS — N281 Cyst of kidney, acquired: Secondary | ICD-10-CM

## 2020-05-20 DIAGNOSIS — I340 Nonrheumatic mitral (valve) insufficiency: Secondary | ICD-10-CM

## 2020-05-20 DIAGNOSIS — R0789 Other chest pain: Secondary | ICD-10-CM

## 2020-05-20 DIAGNOSIS — Z87898 Personal history of other specified conditions: Secondary | ICD-10-CM

## 2020-05-20 DIAGNOSIS — R7989 Other specified abnormal findings of blood chemistry: Secondary | ICD-10-CM | POA: Diagnosis not present

## 2020-05-20 DIAGNOSIS — R911 Solitary pulmonary nodule: Secondary | ICD-10-CM

## 2020-05-20 DIAGNOSIS — I479 Paroxysmal tachycardia, unspecified: Secondary | ICD-10-CM

## 2020-05-20 DIAGNOSIS — J449 Chronic obstructive pulmonary disease, unspecified: Secondary | ICD-10-CM

## 2020-05-20 DIAGNOSIS — R072 Precordial pain: Secondary | ICD-10-CM

## 2020-05-20 MED ORDER — NITROGLYCERIN 0.4 MG SL SUBL: 0.4000 mg | SUBLINGUAL_TABLET | SUBLINGUAL | 3 refills | Status: DC | PRN

## 2020-05-20 NOTE — Telephone Encounter (Signed)
This encounter was created in error - please disregard.

## 2020-05-20 NOTE — Patient Instructions (Signed)
Medication Instructions:  - Your physician recommends that you continue on your current medications as directed. Please refer to the Current Medication list given to you today.  - We will give you a prescription for Nitroglycerin 0.4 mg sublingual tablets to you as needed for chest pain. - you may take up to 1 tablet (dissolve under the tongue) every 5 minutes up to 3 doses in 15 minutes  *If you need a refill on your cardiac medications before your next appointment, please call your pharmacy*   Lab Work: - Your physician recommends that you have lab work today: TSH/ Free T4/ BMP  If you have labs (blood work) drawn today and your tests are completely normal, you will receive your results only by: Marland Kitchen MyChart Message (if you have MyChart) OR . A paper copy in the mail If you have any lab test that is abnormal or we need to change your treatment, we will call you to review the results.   Testing/Procedures: 1) Echocardiogram:  - Your physician has requested that you have an echocardiogram. Echocardiography is a painless test that uses sound waves to create images of your heart. It provides your doctor with information about the size and shape of your heart and how well your heart's chambers and valves are working. This procedure takes approximately one hour. There are no restrictions for this procedure. There is a possibility that an IV may need to be started during your test to inject an image enhancing agent. This is done to obtain more optimal pictures of your heart. Therefore we ask that you do at least drink some water prior to coming in to hydrate your veins.   2) Cardiac CT:  - Your physician has requested that you have cardiac CT. Cardiac computed tomography (CT) is a painless test that uses an x-ray machine to take clear, detailed pictures of your heart.  Your cardiac CT will be scheduled at one of the below locations:   [facility] [address] [place], [place]  [address] [phone]  [facility] [address] [place], [place] [address] [phone]  If scheduled at [facility], please arrive at the [facility] main entrance of [facility] 30 minutes prior to test start time. Proceed to the [facility] Department (first floor) to check-in and test prep.  If scheduled at [facility], please arrive 15 mins early for check-in and test prep.  Please follow these instructions carefully (unless otherwise directed):  Hold all erectile dysfunction medications at least 3 days (72 hrs) prior to test.  On the Night Before the Test: . Be sure to Drink plenty of water. . Do not consume any caffeinated/decaffeinated beverages or chocolate 12 hours prior to your test. . Do not take any antihistamines 12 hours prior to your test.   On the Day of the Test: . Drink plenty of water. Do not drink any water within one hour of the test. . Do not eat any food 4 hours prior to the test. . You may take your regular medications prior to the test.        After the Test: . Drink plenty of water. . After receiving IV contrast, you may experience a mild flushed feeling. This is normal. . On occasion, you may experience a mild rash up to 24 hours after the test. This is not dangerous. If this occurs, you can take Benadryl 25 mg and  increase your fluid intake. . If you experience trouble breathing, this can be serious. If it is severe call 911 IMMEDIATELY. If it is mild, please call our office.  Once we have confirmed authorization from your insurance company, we will call you to set up a date and time for your test. Based on how quickly your insurance processes prior authorizations requests, please allow up to 4 weeks to be contacted for scheduling your Cardiac CT appointment. Be advised that routine Cardiac CT appointments could be scheduled as many as 8 weeks after  your provider has ordered it.  For non-scheduling related questions, please contact the cardiac imaging nurse navigator should you have any questions/concerns: Marchia Bond, Cardiac Imaging Nurse Navigator Burley Saver, Interim Cardiac Imaging Nurse [place] [facility] Direct Office Dial: [phone]   For scheduling needs, including cancellations and rescheduling, please call Vivien Rota at [phone], option 3.    Follow-Up: At [facility], you and your health needs are our priority.  As part of our continuing mission to provide you with exceptional heart care, we have created designated Provider Care Teams.  These Care Teams include your primary Cardiologist (physician) and Advanced Practice Providers (APPs -  Physician Assistants and Nurse Practitioners) who all work together to provide you with the care you need, when you need it.  We recommend signing up for the patient portal called "MyChart".  Sign up information is provided on this After Visit Summary.  MyChart is used to connect with patients for Virtual Visits (Telemedicine).  Patients are able to view lab/test results, encounter notes, upcoming appointments, etc.  Non-urgent messages can be sent to your provider as well.   To learn more about what you can do with MyChart, go to [email or web address].    Your next appointment:   6 week(s)  The format for your next appointment:   In Person  Provider:   You may see Ida Rogue, MD or one of the following Advanced Practice Providers on your designated Care Team:    Murray Hodgkins, NP  Christell Faith, PA-C  Marrianne Mood, PA-C  Cadence Kathlen Mody, [place]    Other Instructions   Echocardiogram An echocardiogram is a procedure that uses painless sound waves (ultrasound) to produce an image of the heart. Images from an echocardiogram can provide important information about:  Signs of coronary artery disease (CAD).  Aneurysm detection. An  aneurysm is a weak or damaged part of an artery wall that bulges out from the normal force of blood pumping through the body.  Heart size and shape. Changes in the size or shape of the heart can be associated with certain conditions, including heart failure, aneurysm, and CAD.  Heart muscle function.  Heart valve function.  Signs of a past heart attack.  Fluid buildup around the heart.  Thickening of the heart muscle.  A tumor or infectious growth around the heart valves. Tell a health care provider about:  Any allergies you have.  All medicines you are taking, including vitamins, herbs, eye drops, creams, and over-the-counter medicines.  Any blood disorders you have.  Any surgeries you have had.  Any medical conditions you have.  Whether you are pregnant or may be pregnant. What are the risks? Generally, this is a safe procedure. However, problems may occur, including:  Allergic reaction to dye (contrast) that may be used during the procedure. What happens before the procedure? No specific preparation is needed. You may eat and drink normally. What happens during the procedure?  An IV tube may be inserted into one of your veins.  You may receive contrast through this tube. A contrast is an injection that improves the quality of the pictures from your heart.  A gel will be applied to your chest.  A wand-like tool (transducer) will be moved over your chest. The gel will help to transmit the sound waves from the transducer.  The sound waves will harmlessly bounce off of your heart to allow the heart images to be captured in real-time motion. The images will be recorded on a computer. The procedure may vary among health care providers and hospitals. What happens after the procedure?  You may return to your normal, everyday life, including diet, activities, and medicines, unless your health care provider tells you not to do that. Summary  An echocardiogram is a  procedure that uses painless sound waves (ultrasound) to produce an image of the heart.  Images from an echocardiogram can provide important information about the size and shape of your heart, heart muscle function, heart valve function, and fluid buildup around your heart.  You do not need to do anything to prepare before this procedure. You may eat and drink normally.  After the echocardiogram is completed, you may return to your normal, everyday life, unless your health care provider tells you not to do that. This information is not intended to replace advice given to you by your health care provider. Make sure you discuss any questions you have with your health care provider. Document Revised: 11/03/2018 Document Reviewed: 08/15/2016 Elsevier Patient Education  [place].     Cardiac CT Angiogram A cardiac CT angiogram is a procedure to look at the heart and the area around the heart. It may be done to help find the cause of chest pains or other symptoms of heart disease. During this procedure, a substance called contrast dye is injected into the blood vessels in the area to be checked. A large X-ray machine, called a CT scanner, then takes detailed pictures of the heart and the surrounding area. The procedure is also sometimes called a coronary CT angiogram, coronary artery scanning, or CTA. A cardiac CT angiogram allows the health care provider to see how well blood is flowing to and from the heart. The health care provider will be able to see if there are any problems, such as:  Blockage or narrowing of the coronary arteries in the heart.  Fluid around the heart.  Signs of weakness or disease in the muscles, valves, and tissues of the heart. Tell a health care provider about:  Any allergies you have. This is especially important if you have had a previous allergic reaction to contrast dye.  All medicines you are taking, including vitamins, herbs, eye drops, creams, and  over-the-counter medicines.  Any blood disorders you have.  Any surgeries you have had.  Any medical conditions you have.  Whether you are pregnant or may be pregnant.  Any anxiety disorders, chronic pain, or other conditions you have that may increase your stress or prevent you from lying still. What are the risks? Generally, this is a safe procedure. However, problems may occur, including:  Bleeding.  Infection.  Allergic reactions to medicines or dyes.  Damage to other structures or organs.  Kidney damage from the contrast dye that is used.  Increased risk of cancer from radiation exposure. This risk is low. Talk with your health care provider about: ? The risks and benefits of testing. ? How you  can receive the lowest dose of radiation. What happens before the procedure?  Wear comfortable clothing and remove any jewelry, glasses, dentures, and hearing aids.  Follow instructions from your health care provider about eating and drinking. This may include: ? For 12 hours before the procedure -- avoid caffeine. This includes tea, coffee, soda, energy drinks, and diet pills. Drink plenty of water or other fluids that do not have caffeine in them. Being well hydrated can prevent complications. ? For 4-6 hours before the procedure -- stop eating and drinking. The contrast dye can cause nausea, but this is less likely if your stomach is empty.  Ask your health care provider about changing or stopping your regular medicines. This is especially important if you are taking diabetes medicines, blood thinners, or medicines to treat problems with erections (erectile dysfunction). What happens during the procedure?   Hair on your chest may need to be removed so that small sticky patches called electrodes can be placed on your chest. These will transmit information that helps to monitor your heart during the procedure.  An IV will be inserted into one of your veins.  You might be given  a medicine to control your heart rate during the procedure. This will help to ensure that good images are obtained.  You will be asked to lie on an exam table. This table will slide in and out of the CT machine during the procedure.  Contrast dye will be injected into the IV. You might feel warm, or you may get a metallic taste in your mouth.  You will be given a medicine called nitroglycerin. This will relax or dilate the arteries in your heart.  The table that you are lying on will move into the CT machine tunnel for the scan.  The person running the machine will give you instructions while the scans are being done. You may be asked to: ? Keep your arms above your head. ? Hold your breath. ? Stay very still, even if the table is moving.  When the scanning is complete, you will be moved out of the machine.  The IV will be removed. The procedure may vary among health care providers and hospitals. What can I expect after the procedure? After your procedure, it is common to have:  A metallic taste in your mouth from the contrast dye.  A feeling of warmth.  A headache from the nitroglycerin. Follow these instructions at home:  Take over-the-counter and prescription medicines only as told by your health care provider.  If you are told, drink enough fluid to keep your urine pale yellow. This will help to flush the contrast dye out of your body.  Most people can return to their normal activities right after the procedure. Ask your health care provider what activities are safe for you.  It is up to you to get the results of your procedure. Ask your health care provider, or the department that is doing the procedure, when your results will be ready.  Keep all follow-up visits as told by your health care provider. This is important. Contact a health care provider if:  You have any symptoms of allergy to the contrast dye. These include: ? Shortness of breath. ? Rash or hives. ? A  racing heartbeat. Summary  A cardiac CT angiogram is a procedure to look at the heart and the area around the heart. It may be done to help find the cause of chest pains or other symptoms of heart  disease.  During this procedure, a large X-ray machine, called a CT scanner, takes detailed pictures of the heart and the surrounding area after a contrast dye has been injected into blood vessels in the area.  Ask your health care provider about changing or stopping your regular medicines before the procedure. This is especially important if you are taking diabetes medicines, blood thinners, or medicines to treat erectile dysfunction.  If you are told, drink enough fluid to keep your urine pale yellow. This will help to flush the contrast dye out of your body. This information is not intended to replace advice given to you by your health care provider. Make sure you discuss any questions you have with your health care provider. Document Revised: 03/08/2019 Document Reviewed: 03/08/2019 Elsevier Patient Education  [place].

## 2020-05-20 NOTE — Progress Notes (Signed)
Office Visit    Patient Name: Benjamin Clarke Date of Encounter: 05/20/2020  Primary Care Provider:  Olin Hauser, DO Primary Cardiologist:  Ida Rogue, MD  Chief Complaint    Chief Complaint  Patient presents with  . OTHER    C/O CHEST PAINS. MEDICATIONS VERBALLY REVIEWED WITH PATIENT.    59 year old male with history of chest discomfort, palpitations, reported RA, colon cancer, s/p resection of 9 inches of his colon in 2009/2010, previous history of chemotherapy, and seen today for report of chest discomfort.  Past Medical History    Past Medical History:  Diagnosis Date  . Abnormal heart rhythm   . 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 COPD (chronic obstructive pulmonary disease) (HCC)    MILD  . Dysrhythmia   . Hemorrhoid   . History of chemotherapy    FOLFOX  . Lung nodule    RIGHT/ NO PROGRESSION  . Mitral valve prolapse    Pt reports Dr. Rockey Situ told him NO MVP  . Rheumatoid arthritis [facility])    Past Surgical History:  Procedure Laterality Date  . AQUEOUS SHUNT Left 10/12/2016   Procedure: AQUEOUS SHUNT with scleral patch graft  Left;  Surgeon: Ronnell Freshwater, MD;  Location: [place];  Service: Ophthalmology;  Laterality: Left;  tube shunt  . BIOPSY Left 10/12/2016   Procedure: BIOPSY (fresh & permanent specimens sent from same location)- conjunctiva left eye;  Surgeon: Ronnell Freshwater, MD;  Location: [place];  Service: Ophthalmology;  Laterality: Left;  . CATARACT EXTRACTION W/PHACO Left 03/01/2018   Procedure: CATARACT EXTRACTION PHACO AND INTRAOCULAR LENS PLACEMENT (IOC);  Surgeon: Birder Robson, MD;  Location: [facility];  Service: Ophthalmology;  Laterality: Left;  [place] 01:49.2 AP% 18.6 CDE 20.32 Fluid Pack Lot # [id number] H  . COLON SURGERY    . COLONOSCOPY  2013, 05/16/2008  . ESOPHAGOGASTRODUODENOSCOPY  06/04/08  . EYE SURGERY Left    TUMOR BIOPSY  . PORT-A-CATH REMOVAL  2010  . PORTACATH PLACEMENT   06/2008    Allergies  Allergies  Allergen Reactions  . Lumigan [Bimatoprost]     REDNESS  . Thorazine [Chlorpromazine] Rash    History of Present Illness    Benjamin Clarke is a 59 y.o. male with PMH as above.  He was previously followed by our clinic in 2011 and lost to follow-up.  On 08/29/2009, he saw his primary cardiologist, Dr. Rockey Situ, with report of chest pain and irregular heartbeat.  He reported feeling fleeting/brief episodes of right-sided chest discomfort with radiation down his bilateral arms at rest and over the months leading up to his visit.  His initial episode occurred 02/2009, during which time he had chest pain at his catheter site/chemotherapy site. He was able to exercise on a treadmill and bike without issue.  No shortness of breath or dyspnea.  He did report an episode of dizziness when driving his car from [place] to the [place] area, which lasted approximately 20 minutes and resolved without intervention.  He self reported a history of MVP.  Echo was ordered to evaluate MVP, and he was started on ASA 81 mg.  Echo showed low normal pump function at 50 to 55% without regional wall motion abnormalities, G1 DD, mild MR, mild LAE, trivial TR.  Follow-up in 1 year was recommended.  On review of EMR, it appears he was lost to follow-up after that time.  Before CP episode, mild nausea that he is uncertain is related  or relevant. No emesis. Not febrile.   On Saturday, 05/18/2020, at approximately 8-9am & while sitting in arm chair watching television, he had an episode of chest discomfort described as left-sided, lasting 1 minute in duration, and with radiation down his left arm.  He states that he believes the chest pain episode actually started in his left arm and then transitioned into his chest, rather than the reverse; however, he states that they may have been separate pains as well.  He states that the pain was not pleuritic or positional.  He may have taken a baby asprin  in response to this chest pain.  He states that, after that time, he was not feeling well over the weekend.  That evening, he went to bed; however, at 3-4am, he experienced recurrent chest pain, describes similar as that above, but only mild in intensity and only a few seconds in duration.  He took another baby asprin at that time. Did not rest well after this CP, as he was worried; therefore, he was not well rested.  He states that he got up for a little time in the morning; however, he then had to nap. Before bed on Sunday, took a 325mg  ASA with no issue until this AM and on 05/20/20.  This morning, he woke up at 5am with even less chest pain, but again, the same as that described above.. Could feel his heart and felt like it was beating more rapidly.  He states that, in most instances above, the CP woke him from sleep.  He again took 1/2 of 325 crushed ASA with water.  He denies any current chest pain or recurrence since his earlier episode this morning 10/25.  He denies any current or recent shortness of breath, dyspnea, syncope, signs or symptoms of bleeding, abdominal distention, PND, orthopnea, or early satiety.  He states that this CP normally does not occur with exertion.  He walks regularly, estimated at 3/4 mile to 1.5 miles, as well as bikes, and does not have CP/dyspnea.  He reports his BP usually runs low at SBP 95-107 and DBP 60s.  He does report occasional dizziness; however, this never last long for him.  Lately, he has increased water intake, and states he drinks bottled water 2-3 per day.  He knows that he has low BP and thus does not limit salt.   Home Medications    Current Outpatient Medications on File Prior to Visit  Medication Sig Dispense Refill  . aspirin EC 81 MG tablet Take 81 mg by mouth daily.    . brimonidine-timolol (COMBIGAN) 0.2-0.5 % ophthalmic solution Place 1 drop into both eyes every 12 (twelve) hours.    . Calcium Carb-Cholecalciferol (CALCIUM-VITAMIN D3) 600-500  MG-UNIT CAPS Take 1 tablet by mouth daily.    . Cholecalciferol (VITAMIN D) 2000 units CAPS Take 2,000 Units by mouth daily.     . Difluprednate (DUREZOL) 0.05 % EMUL Place 1 drop into the left eye 2 (two) times daily.    . Multiple Vitamins-Minerals (DAILY MULTI) TABS Take 1 tablet by mouth daily.     . Multiple Vitamins-Minerals (ZINC PO) Take by mouth.    . QUERCETIN PO Take by mouth.     No current facility-administered medications on file prior to visit.    Review of Systems    He reports left-sided chest pain and left arm pain with episodes that occur at rest but not exertion.  Occasionally, CP wakes him from sleep.  He has had  several episodes within the last few days.each episode has been more mild and more brief in duration than the one before it.  He reports racing heart rate with his most recent episode.  He denies dyspnea, pnd, orthopnea, n, v, syncope, edema, weight gain, or early satiety.he reports occasional dizziness.   All other systems reviewed and are otherwise negative except as noted above.  Physical Exam    VS:  BP 118/80 (BP Location: Left Arm, Patient Position: Sitting, Cuff Size: Normal)   Pulse (!) 53   Ht 6' (1.829 m)   Wt 178 lb (80.7 kg)   SpO2 98%   BMI 24.14 kg/m  , BMI Body mass index is 24.14 kg/m. GEN: Well nourished, well developed, in no acute distress. HEENT: normal. Neck: Supple, no JVD, carotid bruits, or masses. Cardiac: Bradycardic but regular, 1/6 systolic murmur LLSB, rubs, or gallops. No clubbing, cyanosis, edema.  Radials/DP/PT 2+ and equal bilaterally.  Respiratory:  Respirations regular and unlabored, clear to auscultation bilaterally. GI: Soft, nontender, nondistended, BS + x 4. MS: no deformity or atrophy. Skin: warm and dry, no rash. Neuro:  Strength and sensation are intact. Psych: Normal affect.  Accessory Clinical Findings    ECG personally reviewed by me today - SB, 53bpm, no acute ST or T changes.  VITALS Reviewed today    Temp Readings from Last 3 Encounters:  05/02/20 (!) 97.3 F (36.3 C) (Temporal)  12/05/19 (!) 97.3 F (36.3 C) (Temporal)  11/20/19 (!) 97.2 F (36.2 C) (Tympanic)   BP Readings from Last 3 Encounters:  05/20/20 118/80  05/02/20 114/74  12/05/19 103/64   Pulse Readings from Last 3 Encounters:  05/20/20 (!) 53  05/02/20 (!) 57  12/05/19 60    Wt Readings from Last 3 Encounters:  05/20/20 178 lb (80.7 kg)  05/02/20 179 lb (81.2 kg)  12/05/19 187 lb 9.6 oz (85.1 kg)     LABS  reviewed today   Lab Results  Component Value Date   WBC 7.9 11/20/2019   HGB 15.7 11/20/2019   HCT 44.6 11/20/2019   MCV 85.0 11/20/2019   PLT 234 11/20/2019   Lab Results  Component Value Date   CREATININE 1.02 11/20/2019   BUN 15 11/20/2019   NA 138 11/20/2019   K 4.0 11/20/2019   CL 103 11/20/2019   CO2 25 11/20/2019   Lab Results  Component Value Date   ALT 23 11/20/2019   AST 23 11/20/2019   ALKPHOS 58 11/20/2019   BILITOT 0.8 11/20/2019   Lab Results  Component Value Date   CHOL 160 09/13/2019   HDL 61 09/13/2019   LDLCALC 85 09/13/2019   TRIG 55 09/13/2019   CHOLHDL 2.6 09/13/2019    Lab Results  Component Value Date   HGBA1C 5.3 09/13/2019   Lab Results  Component Value Date   TSH 5.38 (H) 09/13/2019     STUDIES/PROCEDURES reviewed today   Echo 08/2009 - Left ventricle: The cavity size was normal. Systolic function was   normal. The estimated ejection fraction was in the range of 50% to   55%. Wall motion was normal; there were no regional wall motion   abnormalities. Doppler parameters are consistent with abnormal   left ventricular relaxation (grade 1 diastolic dysfunction).  - Aortic valve: Structurally normal valve. Trileaflet; normal   thickness leaflets.  - Mitral valve: Mild regurgitation.  - Left atrium: The atrium was mildly dilated.  - Right ventricle: Systolic function was normal.  - Tricuspid valve:  Trivial regurgitation.     US Carotids 05/2011 Findings:  No significant atherosclerotic plaques identified in the extracranial  carotid arteries.  The peak systolic velocities are not elevated.  The vertebral arteries are patent bilaterally.  IMPRESSION:  No hemodynamically significant stenosis in the extracranial carotid  arteries.   Assessment & Plan    Chest pain, atypical   --Reports recent brief and atypical episodes of chest pain that occur at rest but not with exertion.  He is able to exercise on a regular basis without symptoms.  CP is described as different from that previously reported, as it occurs on his left side and also occurs in his left arm.  Associated symptoms include tachypalpitations with his most recent episode.  CP is further described as brief in duration and with each episode more mild than the previous one. Risk factors for cardiac etiology include prior history of tobacco use.  Most recent cholesterol labs show LDL 85 with total cholesterol 160 and triglycerides 55.  A1c well controlled.  He denies any concerning family history of early cardiac death. Previous echo as above with EF 50 to 55%, G1 DD, mild MR, trivial TR.  As below, we will recheck his TSH/free T4. --Ordered cardiac CT.  In addition, will obtain echo to rule out valvular disease or structural abnormality and reassess EF.  Further recommendation at that time. He has been taking ASA, rather than nitro, in order to relieve CP with each episode.  Provided prescription for SL nitro and cautioned him against taking large amounts of ASA in response to each episode of CP with patient understanding.  He may continue to take ASA 81 mg daily.  Informed him that he can always call our office or after hours line if he has another episode of CP that is concerning to him or if any change in his symptoms between visits.   Tachypalpitations --Describes rapid heart rate and feeling more aware of his heart with his most recent episode of CP,  which occurred this morning.  In the past, he has reported palpitations, described as brief and rare, and suspected as either PACs or PVCs.  As above, we will order an echo.  If this is unrevealing, future considerations could include ZIO XT cardiac monitoring.  Will defer for now.  Current heart rate precludes addition of beta-blocker.  Continue ASA 81 mg daily.  He does have a history of abnormal TSH, and we will recheck his TSH and obtain a free T4 today to reassess this lab.  If abnormal, discussed that further work-up will be performed per PCP.  Mitral regurgitation --Continue to monitor with periodic echocardiogram.  Will update 2011 echo as above. He reports previous diagnosis of mitral valve prolapse, which was not seen on previous echocardiogram as above.  2018 R kidney lesion by CT --Will recheck a BMET prior to cardiac CT.  2018 CT shows 15 mm well-defined hypoattenuating lesion of the right kidney, thought most likely cyst, and only minimally increased in nearly 4-year interval since prior exam.  Further work-up or monitoring, if indicated, per PCP.  2018 Lung nodule by CT --2018 CT shows 3 mm right middle lobe pulmonary nodule with patient denying any shortness of breath at rest today or DOE with exercise.  Further work-up or monitoring, if indicated, per PCP.  Abnormal TSH --As above, we will recheck his thyroid labs today.  If abnormal, further work-up/monitoring per PCP.  Medication changes: SL nitro Labs ordered: BMET Studies / Imaging ordered:  Cardiac CT, echo Future considerations: Zio if continued tachypalpitations Disposition: RTC after cardiac CT and echo    Arvil Chaco, PA-C 05/20/2020

## 2020-05-21 LAB — BASIC METABOLIC PANEL
BUN/Creatinine Ratio: 17 (ref 10–24)
BUN: 16 mg/dL (ref 8–27)
CO2: 21 mmol/L (ref 20–29)
Calcium: 9.5 mg/dL (ref 8.6–10.2)
Chloride: 101 mmol/L (ref 96–106)
Creatinine, Ser: 0.94 mg/dL (ref 0.76–1.27)
GFR calc Af Amer: 101 mL/min/{1.73_m2} (ref >59)
GFR calc non Af Amer: 87 mL/min/{1.73_m2} (ref >59)
Glucose: 90 mg/dL (ref 65–99)
Potassium: 4.8 mmol/L (ref 3.5–5.2)
Sodium: 142 mmol/L (ref 134–144)

## 2020-05-21 LAB — T4, FREE: Free T4: 1.1 ng/dL (ref 0.82–1.77)

## 2020-05-21 LAB — TSH: TSH: 2.94 u[IU]/mL (ref 0.450–4.500)

## 2020-05-24 ENCOUNTER — Telehealth: Payer: Self-pay | Admitting: *Deleted

## 2020-05-24 NOTE — Telephone Encounter (Signed)
-----   Message from Arvil Chaco, PA-C sent at 05/23/2020  6:13 PM EDT ----- Renal function stable and electrolytes at goal. Thyroid labs normal. Will await results of echo and cardiac CT.

## 2020-05-24 NOTE — Telephone Encounter (Signed)
No answer. Left message to call back on mobile and home numbers.

## 2020-05-24 NOTE — Telephone Encounter (Signed)
The patient has been notified of the result and verbalized understanding.  All questions (if any) were answered. [place], RN 05/24/2020 12:20 PM

## 2020-06-04 ENCOUNTER — Telehealth (HOSPITAL_COMMUNITY): Payer: Self-pay | Admitting: *Deleted

## 2020-06-04 NOTE — Telephone Encounter (Signed)
Reaching out to patient to offer assistance regarding upcoming cardiac imaging study; pt verbalizes understanding of appt date/time, parking situation and where to check in, pre-test NPO status, and verified current allergies; name and call back number provided for further questions should they arise  [facility] [phone] office [phone] cell

## 2020-06-05 ENCOUNTER — Other Ambulatory Visit: Payer: Self-pay

## 2020-06-05 ENCOUNTER — Ambulatory Visit
Admission: RE | Admit: 2020-06-05 | Discharge: 2020-06-05 | Disposition: A | Discharge status: Home / Self Care | Payer: Managed Care, Other (non HMO) | Source: Ambulatory Visit | Attending: Physician Assistant | Admitting: Physician Assistant

## 2020-06-05 DIAGNOSIS — R0789 Other chest pain: Secondary | ICD-10-CM

## 2020-06-05 MED ORDER — METOPROLOL TARTRATE 5 MG/5ML IV SOLN
10.0000 mg | Freq: Once | INTRAVENOUS | Status: AC
  Administered 2020-06-05: 10 mg via INTRAVENOUS

## 2020-06-05 MED ORDER — IOHEXOL 350 MG/ML SOLN
75.0000 mL | Freq: Once | INTRAVENOUS | Status: AC | PRN
  Administered 2020-06-05: 75 mL via INTRAVENOUS

## 2020-06-05 MED ORDER — NITROGLYCERIN 0.4 MG SL SUBL
0.8000 mg | SUBLINGUAL_TABLET | Freq: Once | SUBLINGUAL | Status: AC
  Administered 2020-06-05: 0.8 mg via SUBLINGUAL

## 2020-06-05 NOTE — Progress Notes (Signed)
Patient tolerated CT well. Drinking water sitting in chair. Vital signs stable encourage to drink water throughout day.Reasons explained and verbalized understanding. Ambulated steady gait.   

## 2020-06-06 ENCOUNTER — Ambulatory Visit (INDEPENDENT_AMBULATORY_CARE_PROVIDER_SITE_OTHER): Payer: Managed Care, Other (non HMO)

## 2020-06-06 DIAGNOSIS — R0789 Other chest pain: Secondary | ICD-10-CM | POA: Diagnosis not present

## 2020-06-06 LAB — ECHOCARDIOGRAM COMPLETE
Area-P 1/2: 2.81 cm2
MV M vel: 4.12 m/s
MV Peak grad: 67.9 mmHg
S' Lateral: 3.3 cm

## 2020-06-07 ENCOUNTER — Telehealth: Payer: Self-pay

## 2020-06-07 NOTE — Telephone Encounter (Signed)
Patient returning call.

## 2020-06-07 NOTE — Telephone Encounter (Signed)
Called to give the patient results. Pt wife sts that the pt is outside doing yard work she will have him call back.

## 2020-06-07 NOTE — Telephone Encounter (Signed)
-----   Message from Arvil Chaco, PA-C sent at 06/07/2020  2:57 PM EST ----- Good news! Overall, a very reassuring study with coronary artery calcium score 19.9, placing him at the 43rd percentile for other men in his same age group. He had mild non-obstructive coronary artery disease. Recommendation was to consider other non-cardiac etiologies of his chest pain and consider preventative therapy for his mild nonobstructive CAD.  From a preventative standpoint:  --Continue ASA 81mg  daily.  --Given his LDL of 85, would also recommend a low dose statin.  --If agreeable, start atorvastatin 20mg  daily with repeat LDL/ALT in 6-8 weeks. We can also wait to discuss the start of a statin until his next visit if preferred.  --Recommend heart healthy diet and activity as tolerated.

## 2020-06-07 NOTE — Telephone Encounter (Signed)
Patient made aware of Cardiac CT results with verbalized understanding. Pt sts that he will wait to talk with Dr. Rockey Situ about starting atorvastatin at his 07/02/20.

## 2020-07-01 NOTE — Progress Notes (Signed)
Cardiology Office Note  Date:  07/02/2020   ID:  Benjamin Clarke, DOB September 18, 1960, MRN [id number]  PCP:  Olin Hauser, DO   Chief Complaint  Patient presents with  . office visit    1 month F/U after cardiac testing; Meds verbally reviewed with patient.    HPI:  59 year old male with history of  chest discomfort,  palpitations, reported RA,  Prior smoker: 10 yrs colon cancer,  s/p resection of 9 inches of his colon in 2009/2010,  previous history of chemotherapy, and seen today for report of chest discomfort.  Seen in clinic 05/20/2020 Prior left arm into left chest pain Stuttering over a few days No further sx   Seen in clinic 04/2020 Cardiac CTA ordered 1. Low coronary calcium score of 19.9. This was 43rd percentile for age and sex matched control.  2. Normal coronary origin with right dominance.  3. Mild calcification the proximal LAD and proximal LCx causing minimal non obstructive disease <25%  4. CAD-RADS 1. Minimal non-obstructive CAD (0-24%). Consider non-atherosclerotic causes of chest pain. Consider preventive therapy and risk factor modification.  Echo Normal study, reviewed   PMH:   has a past medical history of Abnormal heart rhythm, Colon cancer ([place]), COPD (chronic obstructive pulmonary disease) ([place]), Dysrhythmia, Hemorrhoid, History of chemotherapy, Lung nodule, Mitral valve prolapse, and Rheumatoid arthritis ([place]).  PSH:    Past Surgical History:  Procedure Laterality Date  . AQUEOUS SHUNT Left 10/12/2016   Procedure: AQUEOUS SHUNT with scleral patch graft  Left;  Surgeon: Ronnell Freshwater, MD;  Location: [place];  Service: Ophthalmology;  Laterality: Left;  tube shunt  . BIOPSY Left 10/12/2016   Procedure: BIOPSY (fresh & permanent specimens sent from same location)- conjunctiva left eye;  Surgeon: Ronnell Freshwater, MD;  Location: [place];  Service: Ophthalmology;  Laterality: Left;  . CATARACT  EXTRACTION W/PHACO Left 03/01/2018   Procedure: CATARACT EXTRACTION PHACO AND INTRAOCULAR LENS PLACEMENT (IOC);  Surgeon: Birder Robson, MD;  Location: [facility];  Service: Ophthalmology;  Laterality: Left;  [place] 01:49.2 AP% 18.6 CDE 20.32 Fluid Pack Lot # [id number] H  . COLON SURGERY    . COLONOSCOPY  2013, 05/16/2008  . ESOPHAGOGASTRODUODENOSCOPY  06/04/08  . EYE SURGERY Left    TUMOR BIOPSY  . PORT-A-CATH REMOVAL  2010  . PORTACATH PLACEMENT  06/2008    Current Outpatient Medications  Medication Sig Dispense Refill  . Ascorbic Acid (VITAMIN C) 1000 MG tablet Take 1,000 mg by mouth daily.    [place] Kitchen aspirin EC 81 MG tablet Take 81 mg by mouth daily.    . brimonidine-timolol (COMBIGAN) 0.2-0.5 % ophthalmic solution Place 1 drop into both eyes every 12 (twelve) hours.    . Calcium Carb-Cholecalciferol (CALCIUM-VITAMIN D3) 600-500 MG-UNIT CAPS Take 1 tablet by mouth daily.    . Cholecalciferol (VITAMIN D) 2000 units CAPS Take 2,000 Units by mouth daily.     . Difluprednate (DUREZOL) 0.05 % EMUL Place 1 drop into the left eye 2 (two) times daily.    . Multiple Vitamins-Minerals (DAILY MULTI) TABS Take 1 tablet by mouth daily.     . Multiple Vitamins-Minerals (ZINC PO) Take 50 mg by mouth daily.     . nitroGLYCERIN (NITROSTAT) 0.4 MG SL tablet Place 1 tablet (0.4 mg total) under the tongue every 5 (five) minutes as needed for chest pain. 25 tablet 3  . QUERCETIN PO Take 1,200 mg by mouth daily.      No current facility-administered medications for  this visit.     Allergies:   Lumigan [bimatoprost] and Thorazine [chlorpromazine]   Social History:  The patient  reports that he quit smoking about 11 years ago. His smoking use included cigarettes. He started smoking about 12 years ago. He has a 24.00 pack-year smoking history. He has quit using smokeless tobacco. He reports current alcohol use. He reports previous drug use. Drug: Marijuana.   Family History:   family history includes Arthritis in  his mother; Colon cancer (age of onset: 26) in his maternal grandfather; Colon polyps in his mother; Heart murmur in his father; Hypothyroidism in his mother; Rheumatologic disease in his maternal aunt; Valvular heart disease (age of onset: 32) in his father.    Review of Systems: Review of Systems  Constitutional: Negative.   HENT: Negative.   Respiratory: Negative.   Cardiovascular: Negative.   Gastrointestinal: Negative.   Musculoskeletal: Negative.   Neurological: Negative.   Psychiatric/Behavioral: Negative.   All other systems reviewed and are negative.  PHYSICAL EXAM: VS:  BP 116/84 (BP Location: Left Arm, Patient Position: Sitting, Cuff Size: Normal)   Pulse 70   Ht 6' (1.829 m)   Wt 181 lb (82.1 kg)   SpO2 98%   BMI 24.55 kg/m  , BMI Body mass index is 24.55 kg/m. GEN: Well nourished, well developed, in no acute distress HEENT: normal Neck: no JVD, carotid bruits, or masses Cardiac: RRR; no murmurs, rubs, or gallops,no edema  Respiratory:  clear to auscultation bilaterally, normal work of breathing GI: soft, nontender, nondistended, + BS MS: no deformity or atrophy Skin: warm and dry, no rash Neuro:  Strength and sensation are intact Psych: euthymic mood, full affect   Recent Labs: 11/20/2019: ALT 23; Hemoglobin 15.7; Platelets 234 05/20/2020: BUN 16; Creatinine, Ser 0.94; Potassium 4.8; Sodium 142; TSH 2.940    Lipid Panel Lab Results  Component Value Date   CHOL 160 09/13/2019   HDL 61 09/13/2019   LDLCALC 85 09/13/2019   TRIG 55 09/13/2019      Wt Readings from Last 3 Encounters:  07/02/20 181 lb (82.1 kg)  05/20/20 178 lb (80.7 kg)  05/02/20 179 lb (81.2 kg)     ASSESSMENT AND PLAN:  Problem List Items Addressed This Visit    None    Visit Diagnoses    Atypical chest pain    -  Primary   Coronary artery calcification       History of palpitations       Nonrheumatic mitral valve regurgitation         Chest pain, atypical Very mild  coronary calcification Calcium score 19,  low  given smoking history Nonobstructive disease No further workup needed Pain is atypical in nature, possibly even nerve pain from neck down the left arm Echo: normal EF, reviewed in detail  MV disorder Mild MR No further workup  Hx of smoking Stopped    Total encounter time more than 25 minutes  Greater than 50% was spent in counseling and coordination of care with the patient    Signed, Esmond Plants, M.D., Ph.D. [place], [place]

## 2020-07-02 ENCOUNTER — Encounter: Payer: Self-pay | Admitting: Cardiovascular Disease

## 2020-07-02 ENCOUNTER — Other Ambulatory Visit: Payer: Self-pay

## 2020-07-02 ENCOUNTER — Ambulatory Visit (INDEPENDENT_AMBULATORY_CARE_PROVIDER_SITE_OTHER): Payer: Managed Care, Other (non HMO) | Admitting: Cardiovascular Disease

## 2020-07-02 VITALS — BP 116/84 | HR 70 | Ht 72.0 in | Wt 181.0 lb

## 2020-07-02 DIAGNOSIS — I2510 Atherosclerotic heart disease of native coronary artery without angina pectoris: Secondary | ICD-10-CM

## 2020-07-02 DIAGNOSIS — I2584 Coronary atherosclerosis due to calcified coronary lesion: Secondary | ICD-10-CM

## 2020-07-02 DIAGNOSIS — Z87898 Personal history of other specified conditions: Secondary | ICD-10-CM

## 2020-07-02 DIAGNOSIS — I340 Nonrheumatic mitral (valve) insufficiency: Secondary | ICD-10-CM | POA: Diagnosis not present

## 2020-07-02 DIAGNOSIS — R0789 Other chest pain: Secondary | ICD-10-CM | POA: Diagnosis not present

## 2020-07-02 DIAGNOSIS — J449 Chronic obstructive pulmonary disease, unspecified: Secondary | ICD-10-CM

## 2020-07-02 NOTE — Patient Instructions (Signed)
Medication Instructions:  No changes  If you need a refill on your cardiac medications before your next appointment, please call your pharmacy.    Lab work: No new labs needed   If you have labs (blood work) drawn today and your tests are completely normal, you will receive your results only by: . MyChart Message (if you have MyChart) OR . A paper copy in the mail If you have any lab test that is abnormal or we need to change your treatment, we will call you to review the results.   Testing/Procedures: No new testing needed   Follow-Up: At [facility], you and your health needs are our priority.  As part of our continuing mission to provide you with exceptional heart care, we have created designated Provider Care Teams.  These Care Teams include your primary Cardiologist (physician) and Advanced Practice Providers (APPs -  Physician Assistants and Nurse Practitioners) who all work together to provide you with the care you need, when you need it.  . You will need a follow up appointment as needed  . Providers on your designated Care Team:   . Christopher Berge, NP . Ryan Dunn, PA-C . Jacquelyn Visser, PA-C  Any Other Special Instructions Will Be Listed Below (If Applicable).  COVID-19 Vaccine Information can be found at: [email or web address] For questions related to vaccine distribution or appointments, please email [email or web address] or call [phone].     

## 2020-09-17 ENCOUNTER — Other Ambulatory Visit: Payer: Managed Care, Other (non HMO)

## 2020-09-17 ENCOUNTER — Other Ambulatory Visit: Payer: Self-pay

## 2020-09-17 ENCOUNTER — Other Ambulatory Visit: Payer: Self-pay | Admitting: Family Medicine

## 2020-09-17 DIAGNOSIS — R351 Nocturia: Secondary | ICD-10-CM

## 2020-09-17 DIAGNOSIS — R7309 Other abnormal glucose: Secondary | ICD-10-CM

## 2020-09-17 DIAGNOSIS — Z0000 Encounter for general adult medical examination without abnormal findings: Secondary | ICD-10-CM

## 2020-09-17 DIAGNOSIS — R7989 Other specified abnormal findings of blood chemistry: Secondary | ICD-10-CM

## 2020-09-17 DIAGNOSIS — C8599 Non-Hodgkin lymphoma, unspecified, extranodal and solid organ sites: Secondary | ICD-10-CM

## 2020-09-17 DIAGNOSIS — M059 Rheumatoid arthritis with rheumatoid factor, unspecified: Secondary | ICD-10-CM

## 2020-09-18 LAB — CBC WITH DIFFERENTIAL/PLATELET
Absolute Monocytes: 866 cells/uL (ref 200–950)
Basophils Absolute: 8 cells/uL (ref 0–200)
Basophils Relative: 0.1 %
Eosinophils Absolute: 236 cells/uL (ref 15–500)
Eosinophils Relative: 3.1 %
HCT: 44 % (ref 38.5–50.0)
Hemoglobin: 15.1 g/dL (ref 13.2–17.1)
Lymphs Abs: 1816 cells/uL (ref 850–3900)
MCH: 30.1 pg (ref 27.0–33.0)
MCHC: 34.3 g/dL (ref 32.0–36.0)
MCV: 87.6 fL (ref 80.0–100.0)
MPV: 10.2 fL (ref 7.5–12.5)
Monocytes Relative: 11.4 %
Neutro Abs: 4674 cells/uL (ref 1500–7800)
Neutrophils Relative %: 61.5 %
Platelets: 262 10*3/uL (ref 140–400)
RBC: 5.02 10*6/uL (ref 4.20–5.80)
RDW: 13.2 % (ref 11.0–15.0)
Total Lymphocyte: 23.9 %
WBC: 7.6 10*3/uL (ref 3.8–10.8)

## 2020-09-18 LAB — COMPLETE METABOLIC PANEL WITH GFR
AG Ratio: 1.6 (calc) (ref 1.0–2.5)
ALT: 17 U/L (ref 9–46)
AST: 18 U/L (ref 10–35)
Albumin: 4.2 g/dL (ref 3.6–5.1)
Alkaline phosphatase (APISO): 63 U/L (ref 35–144)
BUN: 14 mg/dL (ref 7–25)
CO2: 28 mmol/L (ref 20–32)
Calcium: 9.1 mg/dL (ref 8.6–10.3)
Chloride: 101 mmol/L (ref 98–110)
Creat: 0.99 mg/dL (ref 0.70–1.25)
GFR, Est African American: 95 mL/min/{1.73_m2} (ref >=60)
GFR, Est Non African American: 82 mL/min/{1.73_m2} (ref >=60)
Globulin: 2.6 g/dL (calc) (ref 1.9–3.7)
Glucose, Bld: 88 mg/dL (ref 65–99)
Potassium: 4 mmol/L (ref 3.5–5.3)
Sodium: 138 mmol/L (ref 135–146)
Total Bilirubin: 0.8 mg/dL (ref 0.2–1.2)
Total Protein: 6.8 g/dL (ref 6.1–8.1)

## 2020-09-18 LAB — LIPID PANEL
Cholesterol: 162 mg/dL (ref <200)
HDL: 60 mg/dL (ref >=40)
LDL Cholesterol (Calc): 88 mg/dL (calc)
Non-HDL Cholesterol (Calc): 102 mg/dL (calc) (ref <130)
Total CHOL/HDL Ratio: 2.7 (calc) (ref <5.0)
Triglycerides: 55 mg/dL (ref <150)

## 2020-09-18 LAB — T4, FREE: Free T4: 1.1 ng/dL (ref 0.8–1.8)

## 2020-09-18 LAB — HEMOGLOBIN A1C
Hgb A1c MFr Bld: 5.4 % of total Hgb (ref <5.7)
Mean Plasma Glucose: 108 mg/dL
eAG (mmol/L): 6 mmol/L

## 2020-09-18 LAB — PSA: PSA: 0.4 ng/mL (ref <=4.0)

## 2020-09-18 LAB — TSH: TSH: 6.92 mIU/L — ABNORMAL HIGH (ref 0.40–4.50)

## 2020-09-24 ENCOUNTER — Encounter: Payer: Managed Care, Other (non HMO) | Admitting: Family Medicine

## 2020-11-05 ENCOUNTER — Other Ambulatory Visit: Payer: Self-pay

## 2020-11-05 ENCOUNTER — Other Ambulatory Visit: Payer: Self-pay | Admitting: Family Medicine

## 2020-11-05 ENCOUNTER — Encounter: Payer: Self-pay | Admitting: Family Medicine

## 2020-11-05 ENCOUNTER — Ambulatory Visit (INDEPENDENT_AMBULATORY_CARE_PROVIDER_SITE_OTHER): Payer: Commercial Managed Care - HMO | Admitting: Family Medicine

## 2020-11-05 VITALS — BP 108/69 | HR 62 | Ht 73.0 in | Wt 186.4 lb

## 2020-11-05 DIAGNOSIS — Z0000 Encounter for general adult medical examination without abnormal findings: Secondary | ICD-10-CM | POA: Diagnosis not present

## 2020-11-05 DIAGNOSIS — R7309 Other abnormal glucose: Secondary | ICD-10-CM

## 2020-11-05 DIAGNOSIS — Z85038 Personal history of other malignant neoplasm of large intestine: Secondary | ICD-10-CM

## 2020-11-05 DIAGNOSIS — Z6824 Body mass index (BMI) 24.0-24.9, adult: Secondary | ICD-10-CM

## 2020-11-05 DIAGNOSIS — C884 Extranodal marginal zone B-cell lymphoma of mucosa-associated lymphoid tissue [MALT-lymphoma]: Secondary | ICD-10-CM

## 2020-11-05 DIAGNOSIS — M059 Rheumatoid arthritis with rheumatoid factor, unspecified: Secondary | ICD-10-CM

## 2020-11-05 DIAGNOSIS — R7989 Other specified abnormal findings of blood chemistry: Secondary | ICD-10-CM

## 2020-11-05 DIAGNOSIS — R351 Nocturia: Secondary | ICD-10-CM

## 2020-11-05 NOTE — Progress Notes (Signed)
Subjective:    Patient ID: Benjamin Clarke, male    DOB: 1960/12/12, 60 y.o.   MRN: [id number]  Benjamin Clarke is a 60 y.o. male presenting on 11/05/2020 for Annual Exam   HPI   Here for Annual Physical and Lab Review.  Specialists Oncology - [facility] - Dr Rogue Bussing Ophthalmology - [facility] - Dr Carmelina Dane  History of Low BP / Blood Pressure, monitoring. He has not had issues with low heart rate or other issues lately.  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 He says fam history of colon CA.  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 Last lipid panel 08/2020, controlled on lifestyle - Not on Statin therapy. He is not interested at this time Taking ASA 81 daily - He has history of Cardiology work up years ago due to having history of arm pains and worry about cardiac issue he had ECHO, CT Coronary testing done, without significant blockage or cardiovascular concerns.  Additional / Follow-up topics  Elevated TSH Mild elevated TSH again up to 6.92, normal Free T4 1.1, prior TSH variable from 2-3 up to 5-6 now. With normal T4. Fam history mother with hypothyroidism on synthroid. He is asymptomatic  Glaucoma Followed by [facility], due for refraction and new lenses -required shunt after failed eye drops for pressure  PMH L Inguinal Hernia - he has episodes of this flaring up.    Health Maintenance:  Colon CA Screening: Last Colonoscopydone 08/16/18(done by Dr Leighton Ruff), results unavailable, good for 3 years by report - will request copy of formal report.Currently  asymptomatic.Significant known family history of colon CAat earlier age.Next due in 2023 most likely  UTD Hep C screening negative, 06/03/2016 UTD HIV screening negative 08/2019  Prostate CA Screening: Prior history of DRE reported normal >10 years ago. Last PSA 0.4 (10/2020). Currently minimal symptoms with mild nocturia x 1, and some residual urine leakage, slightly weaker urine flow. No known family history of prostate CA.    Depression screen [facility] 2/9 11/05/2020 09/20/2019 05/19/2019  Decreased Interest 0 0 0  Down, Depressed, Hopeless 0 0 0  PHQ - 2 Score 0 0 0    Past Medical History:  Diagnosis Date  . Abnormal heart rhythm   . Colon cancer ([place])    sigmoid  . Dysrhythmia   . Hemorrhoid   . History of chemotherapy    FOLFOX  . Lung nodule    RIGHT/ NO PROGRESSION  . Mitral valve prolapse    Pt reports Dr. Rockey Situ told him NO MVP  . Rheumatoid arthritis [facility])    Past Surgical History:  Procedure Laterality Date  . AQUEOUS SHUNT Left 10/12/2016   Procedure: AQUEOUS SHUNT with scleral patch graft  Left;  Surgeon: Ronnell Freshwater, MD;  Location: [place];  Service: Ophthalmology;  Laterality: Left;  tube shunt  . BIOPSY Left 10/12/2016   Procedure: BIOPSY (fresh & permanent specimens sent from same location)- conjunctiva left eye;  Surgeon: Ronnell Freshwater, MD;  Location: [place];  Service: Ophthalmology;  Laterality: Left;  . CATARACT EXTRACTION W/PHACO Left 03/01/2018   Procedure: CATARACT EXTRACTION PHACO AND INTRAOCULAR LENS PLACEMENT (  [place]);  Surgeon: Birder Robson, MD;  Location: [facility];  Service: Ophthalmology;  Laterality: Left;  [place] 01:49.2 AP% 18.6 CDE 20.32 Fluid Pack Lot # [id number]  . COLON SURGERY    . COLONOSCOPY  2013, 05/16/2008  . ESOPHAGOGASTRODUODENOSCOPY  06/04/08  . EYE SURGERY Left    TUMOR BIOPSY  . PORT-A-CATH REMOVAL  2010  . PORTACATH PLACEMENT  06/2008   Social History   Socioeconomic History  .  Marital status: Single    Spouse name: Not on file  . Number of children: Not on file  . Years of education: College  . Highest education level: Not on file  Occupational History  . Not on file  Tobacco Use  . Smoking status: Former Smoker    Packs/day: 1.00    Years: 24.00    Pack years: 24.00    Types: Cigarettes    Start date: 04/15/2008    Quit date: 07/27/2008    Years since quitting: 12.2  . Smokeless tobacco: Former Network engineer  . Vaping Use: Never used  Substance and Sexual Activity  . Alcohol use: Yes    Alcohol/week: 0.0 standard drinks    Comment: occasional alcohol use-wine  . Drug use: Not Currently    Types: Marijuana    Comment: last used 1989  . Sexual activity: Not on file  Other Topics Concern  . Not on file  Social History Narrative   Retired. Single. Regularly exercises.    Social Determinants of Health   Financial Resource Strain: Not on file  Food Insecurity: Not on file  Transportation Needs: Not on file  Physical Activity: Not on file  Stress: Not on file  Social Connections: Not on file  Intimate Partner Violence: Not on file   Family History  Problem Relation Age of Onset  . Arthritis Mother   . Colon polyps Mother   . Hypothyroidism Mother   . Heart murmur Father   . Valvular heart disease Father 2  . Colon cancer Maternal Grandfather 4  . Rheumatologic disease Maternal Aunt   . Prostate cancer Neg Hx    Current Outpatient Medications on File Prior to Visit  Medication Sig  . Ascorbic Acid (VITAMIN C) 1000 MG tablet Take 1,000 mg by mouth daily.  Marland Kitchen aspirin EC 81 MG tablet Take 81 mg by mouth daily.  . brimonidine-timolol (COMBIGAN) 0.2-0.5 % ophthalmic solution Place 1 drop into both eyes every 12 (twelve) hours.  . Calcium Carb-Cholecalciferol (CALCIUM-VITAMIN D3) 600-500 MG-UNIT CAPS Take 1 tablet by mouth daily.  . Cholecalciferol (VITAMIN D) 2000 units CAPS Take 2,000 Units by mouth daily.   . Difluprednate (DUREZOL) 0.05  % EMUL Place 1 drop into the left eye 2 (two) times daily.  . Multiple Vitamins-Minerals (DAILY MULTI) TABS Take 1 tablet by mouth daily.   . Multiple Vitamins-Minerals (ZINC PO) Take 50 mg by mouth daily.   . nitroGLYCERIN (NITROSTAT) 0.4 MG SL tablet Place 1 tablet (0.4 mg total) under the tongue every 5 (five) minutes as needed for chest pain.  Marland Kitchen QUERCETIN PO Take 1,200 mg by mouth daily.    No current facility-administered medications on file prior to visit.    Review of Systems  Constitutional: Negative for activity change, appetite change, chills, diaphoresis, fatigue and fever.  HENT: Negative for congestion and hearing loss.   Eyes: Negative for visual disturbance.  Respiratory: Negative for apnea, cough, chest tightness, shortness of breath and wheezing.   Cardiovascular: Negative for chest pain, palpitations  and leg swelling.  Gastrointestinal: Negative for abdominal pain, constipation, diarrhea, nausea and vomiting.  Endocrine: Negative for cold intolerance.  Genitourinary: Negative for difficulty urinating, dysuria, frequency and hematuria.  Musculoskeletal: Negative for arthralgias and neck pain.  Skin: Negative for rash.  Allergic/Immunologic: Negative for environmental allergies.  Neurological: Negative for dizziness, weakness, light-headedness, numbness and headaches.  Hematological: Negative for adenopathy.  Psychiatric/Behavioral: Negative for behavioral problems, dysphoric mood and sleep disturbance. The patient is not nervous/anxious.    Per HPI unless specifically indicated above     Objective:    BP 108/69   Pulse 62   Ht 6\' 1"  (1.854 m)   Wt 186 lb 6.4 oz (84.6 kg)   SpO2 100%   BMI 24.59 kg/m   Wt Readings from Last 3 Encounters:  11/05/20 186 lb 6.4 oz (84.6 kg)  07/02/20 181 lb (82.1 kg)  05/20/20 178 lb (80.7 kg)    Physical Exam Vitals and nursing note reviewed.  Constitutional:      General: He is not in acute distress.    Appearance: He is  well-developed. He is not diaphoretic.     Comments: Well-appearing, comfortable, cooperative  HENT:     Head: Normocephalic and atraumatic.     Right Ear: Tympanic membrane, ear canal and external ear normal. There is no impacted cerumen.     Left Ear: Tympanic membrane, ear canal and external ear normal. There is impacted cerumen.  Eyes:     General:        Right eye: No discharge.        Left eye: No discharge.     Conjunctiva/sclera: Conjunctivae normal.     Pupils: Pupils are equal, round, and reactive to light.  Neck:     Thyroid: No thyromegaly.     Vascular: No carotid bruit.  Cardiovascular:     Rate and Rhythm: Normal rate and regular rhythm.     Heart sounds: Normal heart sounds. No murmur heard.   Pulmonary:     Effort: Pulmonary effort is normal. No respiratory distress.     Breath sounds: Normal breath sounds. No wheezing or rales.  Abdominal:     General: Bowel sounds are normal. There is no distension.     Palpations: Abdomen is soft. There is no mass.     Tenderness: There is no abdominal tenderness.  Musculoskeletal:        General: No tenderness. Normal range of motion.     Cervical back: Normal range of motion and neck supple.     Right lower leg: No edema.     Left lower leg: No edema.     Comments: Upper / Lower Extremities: - Normal muscle tone, strength bilateral upper extremities 5/5, lower extremities 5/5  Lymphadenopathy:     Cervical: No cervical adenopathy.  Skin:    General: Skin is warm and dry.     Findings: No erythema or rash.  Neurological:     Mental Status: He is alert and oriented to person, place, and time.     Comments: Distal sensation intact to light touch all extremities  Psychiatric:        Behavior: Behavior normal.     Comments: Well groomed, good eye contact, normal speech and thoughts    Results for orders placed or performed in visit on 09/17/20  Hemoglobin A1c  Result Value Ref Range   Hgb A1c MFr Bld 5.4 <5.7 % of  total Hgb   Mean Plasma Glucose 108 mg/dL  eAG (mmol/L) 6.0 mmol/L  CBC with Differential/Platelet  Result Value Ref Range   WBC 7.6 3.8 - 10.8 Thousand/uL   RBC 5.02 4.20 - 5.80 Million/uL   Hemoglobin 15.1 13.2 - 17.1 g/dL   HCT 44.0 38.5 - 50.0 %   MCV 87.6 80.0 - 100.0 fL   MCH 30.1 27.0 - 33.0 pg   MCHC 34.3 32.0 - 36.0 g/dL   RDW 13.2 11.0 - 15.0 %   Platelets 262 140 - 400 Thousand/uL   MPV 10.2 7.5 - 12.5 fL   Neutro Abs 4,674 1,500 - 7,800 cells/uL   Lymphs Abs 1,816 850 - 3,900 cells/uL   Absolute Monocytes 866 200 - 950 cells/uL   Eosinophils Absolute 236 15 - 500 cells/uL   Basophils Absolute 8 0 - 200 cells/uL   Neutrophils Relative % 61.5 %   Total Lymphocyte 23.9 %   Monocytes Relative 11.4 %   Eosinophils Relative 3.1 %   Basophils Relative 0.1 %  COMPLETE METABOLIC PANEL WITH GFR  Result Value Ref Range   Glucose, Bld 88 65 - 99 mg/dL   BUN 14 7 - 25 mg/dL   Creat 0.99 0.70 - 1.25 mg/dL   GFR, Est Non African American 82 > OR = 60 mL/min/1.15m2   GFR, Est African American 95 > OR = 60 mL/min/1.63m2   BUN/Creatinine Ratio NOT APPLICABLE 6 - 22 (calc)   Sodium 138 135 - 146 mmol/L   Potassium 4.0 3.5 - 5.3 mmol/L   Chloride 101 98 - 110 mmol/L   CO2 28 20 - 32 mmol/L   Calcium 9.1 8.6 - 10.3 mg/dL   Total Protein 6.8 6.1 - 8.1 g/dL   Albumin 4.2 3.6 - 5.1 g/dL   Globulin 2.6 1.9 - 3.7 g/dL (calc)   AG Ratio 1.6 1.0 - 2.5 (calc)   Total Bilirubin 0.8 0.2 - 1.2 mg/dL   Alkaline phosphatase (APISO) 63 35 - 144 U/L   AST 18 10 - 35 U/L   ALT 17 9 - 46 U/L  Lipid panel  Result Value Ref Range   Cholesterol 162 <200 mg/dL   HDL 60 > OR = 40 mg/dL   Triglycerides 55 <150 mg/dL   LDL Cholesterol (Calc) 88 mg/dL (calc)   Total CHOL/HDL Ratio 2.7 <5.0 (calc)   Non-HDL Cholesterol (Calc) 102 <130 mg/dL (calc)  PSA  Result Value Ref Range   PSA 0.40 < OR = 4.0 ng/mL  TSH  Result Value Ref Range   TSH 6.92 (H) 0.40 - 4.50 mIU/L  T4, free  Result Value  Ref Range   Free T4 1.1 0.8 - 1.8 ng/dL      Assessment & Plan:   Problem List Items Addressed This Visit    Rheumatoid arthritis (HCC)   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 May reconsider if risk increases in future. Consider Red Rice Yeast as option but not at high level  LDL to require it. Monitor Thyroid still has had fluctuation of TSH but normal T4. Continue to monitor clinically/lab yearly Goal maintain healthy weight / vs weight loss as indicated  No orders of the defined types were placed in this encounter.     Follow up plan: Return in about 1 year (around 11/05/2021) for 1 year fasting lab only then 1 week later Annual Physical.  Future labs ordered 11/03/21  Nobie Putnam, [facility] 11/05/2020, 8:14 AM

## 2020-11-05 NOTE — Patient Instructions (Addendum)
Thank you for coming to the office today.  Please check with [facility] for follow-up Colonoscopy anticipated in 2023, see what they recommend and when to repeat.  We can monitor thyroid as well.  Consider Red Rice Yeast for Statin in future but this is only for lowering the LDL level which is not required for you at this time. But in future we can reconsider Statin therapy for cardiovascular protection.   DUE for FASTING BLOOD WORK (no food or drink after midnight before the lab appointment, only water or coffee without cream/sugar on the morning of)  SCHEDULE "Lab Only" visit in the morning at the clinic for lab draw in 1 YEAR  - Make sure Lab Only appointment is at about 1 week before your next appointment, so that results will be available  For Lab Results, once available within 2-3 days of blood draw, you can can log in to MyChart online to view your results and a brief explanation. Also, we can discuss results at next follow-up visit.   Please schedule a Follow-up Appointment to: Return in about 1 year (around 11/05/2021) for 1 year fasting lab only then 1 week later Annual Physical.  If you have any other questions or concerns, please feel free to call the office or send a message through [place]. You may also schedule an earlier appointment if necessary.  Additionally, you may be receiving a survey about your experience at our office within a few days to 1 week by e-mail or mail. We value your feedback.  Nobie Putnam, DO [place]

## 2020-11-18 ENCOUNTER — Other Ambulatory Visit: Payer: Self-pay

## 2020-11-18 DIAGNOSIS — C187 Malignant neoplasm of sigmoid colon: Secondary | ICD-10-CM

## 2020-11-18 DIAGNOSIS — C884 Extranodal marginal zone B-cell lymphoma of mucosa-associated lymphoid tissue [MALT-lymphoma]: Secondary | ICD-10-CM

## 2020-11-19 ENCOUNTER — Inpatient Hospital Stay (HOSPITAL_BASED_OUTPATIENT_CLINIC_OR_DEPARTMENT_OTHER): Payer: Commercial Managed Care - HMO | Admitting: Internal Medicine

## 2020-11-19 ENCOUNTER — Inpatient Hospital Stay: Payer: Commercial Managed Care - HMO | Attending: Internal Medicine

## 2020-11-19 ENCOUNTER — Encounter: Payer: Self-pay | Admitting: Internal Medicine

## 2020-11-19 VITALS — BP 115/83 | HR 64 | Temp 98.9°F | Resp 16 | Ht 73.0 in | Wt 187.0 lb

## 2020-11-19 DIAGNOSIS — C187 Malignant neoplasm of sigmoid colon: Secondary | ICD-10-CM

## 2020-11-19 DIAGNOSIS — Z85038 Personal history of other malignant neoplasm of large intestine: Secondary | ICD-10-CM | POA: Insufficient documentation

## 2020-11-19 DIAGNOSIS — Z87891 Personal history of nicotine dependence: Secondary | ICD-10-CM | POA: Insufficient documentation

## 2020-11-19 DIAGNOSIS — C884 Extranodal marginal zone B-cell lymphoma of mucosa-associated lymphoid tissue [MALT-lymphoma]: Secondary | ICD-10-CM

## 2020-11-19 LAB — CBC WITH DIFFERENTIAL/PLATELET
Abs Immature Granulocytes: 0.02 10*3/uL (ref 0.00–0.07)
Basophils Absolute: 0 10*3/uL (ref 0.0–0.1)
Basophils Relative: 0 %
Eosinophils Absolute: 0.3 10*3/uL (ref 0.0–0.5)
Eosinophils Relative: 4 %
HCT: 44.6 % (ref 39.0–52.0)
Hemoglobin: 15.6 g/dL (ref 13.0–17.0)
Immature Granulocytes: 0 %
Lymphocytes Relative: 22 %
Lymphs Abs: 1.7 10*3/uL (ref 0.7–4.0)
MCH: 30.1 pg (ref 26.0–34.0)
MCHC: 35 g/dL (ref 30.0–36.0)
MCV: 86.1 fL (ref 80.0–100.0)
Monocytes Absolute: 0.7 10*3/uL (ref 0.1–1.0)
Monocytes Relative: 9 %
Neutro Abs: 4.8 10*3/uL (ref 1.7–7.7)
Neutrophils Relative %: 65 %
Platelets: 237 10*3/uL (ref 150–400)
RBC: 5.18 MIL/uL (ref 4.22–5.81)
RDW: 12.9 % (ref 11.5–15.5)
WBC: 7.5 10*3/uL (ref 4.0–10.5)
nRBC: 0 % (ref 0.0–0.2)

## 2020-11-19 LAB — COMPREHENSIVE METABOLIC PANEL
ALT: 20 U/L (ref 0–44)
AST: 25 U/L (ref 15–41)
Albumin: 4 g/dL (ref 3.5–5.0)
Alkaline Phosphatase: 54 U/L (ref 38–126)
Anion gap: 11 (ref 5–15)
BUN: 15 mg/dL (ref 8–23)
CO2: 25 mmol/L (ref 22–32)
Calcium: 9.1 mg/dL (ref 8.9–10.3)
Chloride: 103 mmol/L (ref 98–111)
Creatinine, Ser: 0.92 mg/dL (ref 0.61–1.24)
GFR, Estimated: >60 mL/min (ref >60)
Glucose, Bld: 114 mg/dL — ABNORMAL HIGH (ref 70–99)
Potassium: 4 mmol/L (ref 3.5–5.1)
Sodium: 139 mmol/L (ref 135–145)
Total Bilirubin: 0.9 mg/dL (ref 0.3–1.2)
Total Protein: 7.4 g/dL (ref 6.5–8.1)

## 2020-11-19 NOTE — Assessment & Plan Note (Addendum)
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 S/p RT [jan 15th 2019]- STABLE.   # Clinically no evidence of recurrence.;  Continue surveillance without imaging.  Ultrasound [facility]- March 2021- WNL.  # Left eye discomfort/tearing-elated to allergies/underlying uveitis/scleritis.  STABLE/ [facility]- / Dr.Profilio; [facility]]  # left inguinal hernia- no signs of obstruction-   #Stage III colon cancer-at 31 years;  NED; stable [Maternal grandfather with colon cancer at age of 90. discuss at next visit.[Brother- in [place]; pt no children]-declines genetic counseling  #Disposition : # Follow up in 12 months-MD;labs-cbc/cmp/cea- Dr.B  Cc; Dr.Karamgeglos

## 2020-11-19 NOTE — Progress Notes (Signed)
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 On Mxt [summer 2017]  # Lung nodule [incidental on CT]; last Nov 2014- 6m RML- STABLE [no further fu]  # ? 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  Patient denies any worsening eye swelling.  He continues to have chronic discomfort from his uveitis.   Review of Systems  Constitutional: Negative for chills, diaphoresis, fever, malaise/fatigue and weight loss.  HENT: Negative for nosebleeds and sore throat.   Eyes: Negative for double vision.       Left eye chronic poor vision/runny eyes.  Respiratory: Negative for cough, hemoptysis, sputum production, shortness of breath and wheezing.   Cardiovascular: Negative for chest pain, palpitations, orthopnea and leg swelling.  Gastrointestinal: Negative for  abdominal pain, blood in stool, constipation, diarrhea, heartburn, melena, nausea and vomiting.  Genitourinary: Negative for dysuria, frequency and urgency.  Musculoskeletal: Negative for back pain and joint pain.  Skin: Negative.  Negative for itching and rash.  Neurological: Negative for dizziness, tingling, focal weakness, weakness and headaches.  Endo/Heme/Allergies: Does not bruise/bleed easily.  Psychiatric/Behavioral: Negative for depression. The patient is not nervous/anxious and does not have insomnia.      PAST MEDICAL HISTORY :  Past Medical History:  Diagnosis Date  . Abnormal heart rhythm   . Colon cancer ([place]    sigmoid  . Dysrhythmia   . Hemorrhoid   . History of chemotherapy    FOLFOX  . Lung nodule    RIGHT/ NO PROGRESSION  . Mitral valve prolapse    Pt reports Dr. GRockey Situtold him NO MVP  . Rheumatoid arthritis (HM[place]     PAST SURGICAL HISTORY :   Past Surgical History:  Procedure Laterality Date  . AQUEOUS SHUNT Left 10/12/2016   Procedure: AQUEOUS SHUNT with scleral patch graft  Left;  Surgeon: ARonnell Freshwater MD;  [place] [facility]  Service: Ophthalmology;  Laterality: Left;  tube shunt  . BIOPSY Left 10/12/2016   Procedure: BIOPSY (fresh & permanent specimens sent from same location)- conjunctiva left eye;  Surgeon: ARonnell Freshwater MD;  [place] [facility]  Service: Ophthalmology;  Laterality: Left;  . CATARACT EXTRACTION W/PHACO Left 03/01/2018   Procedure: CATARACT EXTRACTION PHACO AND INTRAOCULAR LENS PLACEMENT (IOC);  Surgeon: PBirder Robson MD;  Location: [facility];  Service: Ophthalmology;  Laterality: Left;  [place]:49.2 AP%  18.6 CDE 20.32 Fluid Pack Lot # [id number]  . COLON SURGERY    . COLONOSCOPY  2013, 05/16/2008  . ESOPHAGOGASTRODUODENOSCOPY  06/04/08  . EYE SURGERY Left    TUMOR BIOPSY  . PORT-A-CATH REMOVAL  2010  . PORTACATH PLACEMENT  06/2008    FAMILY HISTORY :   Family History  Problem  Relation Age of Onset  . Arthritis Mother   . Colon polyps Mother   . Hypothyroidism Mother   . Heart murmur Father   . Valvular heart disease Father 60  . Colon cancer Maternal Grandfather 71  . Rheumatologic disease Maternal Aunt   . Prostate cancer Neg Hx     SOCIAL HISTORY:   Social History   Tobacco Use  . Smoking status: Former Smoker    Packs/day: 1.00    Years: 24.00    Pack years: 24.00    Types: Cigarettes    Start date: 04/15/2008    Quit date: 07/27/2008    Years since quitting: 12.3  . Smokeless tobacco: Former Network engineer  . Vaping Use: Never used  Substance Use Topics  . Alcohol use: Yes    Alcohol/week: 0.0 standard drinks    Comment: occasional alcohol use-wine  . Drug use: Not Currently    Types: Marijuana    Comment: last used 1989    ALLERGIES:  is allergic to lumigan [bimatoprost] and thorazine [chlorpromazine].  MEDICATIONS:  Current Outpatient Medications  Medication Sig Dispense Refill  . Ascorbic Acid (VITAMIN C) 1000 MG tablet Take 1,000 mg by mouth daily.    Marland Kitchen aspirin EC 81 MG tablet Take 81 mg by mouth daily.    . brimonidine-timolol (COMBIGAN) 0.2-0.5 % ophthalmic solution Place 1 drop into both eyes every 12 (twelve) hours.    . Cholecalciferol (VITAMIN D) 2000 units CAPS Take 2,000 Units by mouth daily.    . Difluprednate 0.05 % EMUL Place 1 drop into the left eye 2 (two) times daily.    . Multiple Vitamins-Minerals (DAILY MULTI) TABS Take 1 tablet by mouth daily.    Marland Kitchen QUERCETIN PO Take 1,200 mg by mouth daily.     . nitroGLYCERIN (NITROSTAT) 0.4 MG SL tablet Place 1 tablet (0.4 mg total) under the tongue every 5 (five) minutes as needed for chest pain. 25 tablet 3   No current facility-administered medications for this visit.    PHYSICAL EXAMINATION: ECOG PERFORMANCE STATUS: 0 - Asymptomatic  BP 115/83 (BP Location: Right Arm, Patient Position: Sitting, Cuff Size: Normal)   Pulse 64   Temp 98.9 F (37.2 C) (Tympanic)   Resp  16   Ht '6\' 1"'  (1.854 m)   Wt 187 lb (84.8 kg)   SpO2 98%   BMI 24.67 kg/m   Filed Weights   11/19/20 1023  Weight: 187 lb (84.8 kg)   Physical Exam Constitutional:      Comments: Patient is alone.  HENT:     Head: Normocephalic and atraumatic.     Mouth/Throat:     Pharynx: No oropharyngeal exudate.  Eyes:     Pupils: Pupils are equal, round, and reactive to light.  Cardiovascular:     Rate and Rhythm: Normal rate and regular rhythm.  Pulmonary:     Effort: No respiratory distress.     Breath sounds: No wheezing.  Abdominal:     General: Bowel sounds are normal. There is no distension.     Palpations: Abdomen is soft. There is no mass.     Tenderness:  There is no abdominal tenderness. There is no guarding or rebound.  Musculoskeletal:        General: No tenderness. Normal range of motion.     Cervical back: Normal range of motion and neck supple.  Skin:    General: Skin is warm.  Neurological:     Mental Status: He is alert and oriented to person, place, and time.  Psychiatric:        Mood and Affect: Affect normal.      LABORATORY DATA:  I have reviewed the data as listed    Component Value Date/Time   NA 139 11/19/2020 1013   NA 142 05/20/2020 1546   NA 138 05/03/2014 1041   K 4.0 11/19/2020 1013   K 3.8 05/03/2014 1041   CL 103 11/19/2020 1013   CL 103 05/03/2014 1041   CO2 25 11/19/2020 1013   CO2 30 05/03/2014 1041   GLUCOSE 114 (H) 11/19/2020 1013   GLUCOSE 102 (H) 05/03/2014 1041   BUN 15 11/19/2020 1013   BUN 16 05/20/2020 1546   BUN 15 05/03/2014 1041   CREATININE 0.92 11/19/2020 1013   CREATININE 0.99 09/17/2020 0814   CALCIUM 9.1 11/19/2020 1013   CALCIUM 9.2 05/03/2014 1041   PROT 7.4 11/19/2020 1013   PROT 7.5 05/03/2014 1041   ALBUMIN 4.0 11/19/2020 1013   ALBUMIN 3.6 05/03/2014 1041   AST 25 11/19/2020 1013   AST 19 05/03/2014 1041   ALT 20 11/19/2020 1013   ALT 20 05/03/2014 1041   ALKPHOS 54 11/19/2020 1013   ALKPHOS 52  05/03/2014 1041   BILITOT 0.9 11/19/2020 1013   BILITOT 0.7 05/03/2014 1041   GFRNONAA >60 11/19/2020 1013   GFRNONAA 82 09/17/2020 0814   GFRAA 95 09/17/2020 0814    No results found for: SPEP, UPEP  Lab Results  Component Value Date   WBC 7.5 11/19/2020   NEUTROABS 4.8 11/19/2020   HGB 15.6 11/19/2020   HCT 44.6 11/19/2020   MCV 86.1 11/19/2020   PLT 237 11/19/2020      Chemistry      Component Value Date/Time   NA 139 11/19/2020 1013   NA 142 05/20/2020 1546   NA 138 05/03/2014 1041   K 4.0 11/19/2020 1013   K 3.8 05/03/2014 1041   CL 103 11/19/2020 1013   CL 103 05/03/2014 1041   CO2 25 11/19/2020 1013   CO2 30 05/03/2014 1041   BUN 15 11/19/2020 1013   BUN 16 05/20/2020 1546   BUN 15 05/03/2014 1041   CREATININE 0.92 11/19/2020 1013   CREATININE 0.99 09/17/2020 0814      Component Value Date/Time   CALCIUM 9.1 11/19/2020 1013   CALCIUM 9.2 05/03/2014 1041   ALKPHOS 54 11/19/2020 1013   ALKPHOS 52 05/03/2014 1041   AST 25 11/19/2020 1013   AST 19 05/03/2014 1041   ALT 20 11/19/2020 1013   ALT 20 05/03/2014 1041   BILITOT 0.9 11/19/2020 1013   BILITOT 0.7 05/03/2014 1041      ASSESSMENT & PLAN:  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 S/p RT [jan 15th 2019]- STABLE.   # Clinically no evidence of recurrence.;  Continue surveillance without imaging.  Ultrasound [facility]- March 2021- WNL.  # Left eye discomfort/tearing-elated to allergies/underlying uveitis/scleritis.  STABLE/ [facility]- / Dr.Profilio; [facility]]  # left inguinal hernia- no signs of obstruction-   #Stage III colon cancer-at 47 years;  NED; stable [  Maternal grandfather with colon cancer at age of 64. discuss at next visit.[Brother- in [place]; pt no children]-declines genetic counseling  #Disposition : # Follow up in 12 months-MD;labs-cbc/cmp/cea- Dr.B  Cc;  Dr.Karamgeglos     Cammie Sickle, MD 12/03/2020 9:31 PM

## 2020-11-19 NOTE — Progress Notes (Signed)
Has a hernia that he wanted to talk with you about. He has on and off nausea that he was wondering was associated with it.

## 2020-11-20 LAB — CEA: CEA: 2.3 ng/mL (ref 0.0–4.7)

## 2020-12-24 ENCOUNTER — Other Ambulatory Visit: Payer: Self-pay | Admitting: Dermatology

## 2021-09-09 ENCOUNTER — Other Ambulatory Visit: Payer: Self-pay | Admitting: Dermatology

## 2021-11-03 ENCOUNTER — Other Ambulatory Visit: Payer: Commercial Managed Care - HMO

## 2021-11-10 ENCOUNTER — Encounter: Payer: Commercial Managed Care - HMO | Admitting: Family Medicine

## 2021-11-14 ENCOUNTER — Other Ambulatory Visit: Payer: Self-pay

## 2021-11-14 DIAGNOSIS — Z6824 Body mass index (BMI) 24.0-24.9, adult: Secondary | ICD-10-CM

## 2021-11-14 DIAGNOSIS — R7989 Other specified abnormal findings of blood chemistry: Secondary | ICD-10-CM

## 2021-11-14 DIAGNOSIS — R7309 Other abnormal glucose: Secondary | ICD-10-CM

## 2021-11-14 DIAGNOSIS — Z0000 Encounter for general adult medical examination without abnormal findings: Secondary | ICD-10-CM

## 2021-11-14 DIAGNOSIS — Z125 Encounter for screening for malignant neoplasm of prostate: Secondary | ICD-10-CM

## 2021-11-17 ENCOUNTER — Other Ambulatory Visit: Payer: Self-pay | Admitting: Internal Medicine

## 2021-11-17 ENCOUNTER — Other Ambulatory Visit: Payer: Commercial Managed Care - HMO

## 2021-11-17 DIAGNOSIS — R7989 Other specified abnormal findings of blood chemistry: Secondary | ICD-10-CM | POA: Diagnosis not present

## 2021-11-17 DIAGNOSIS — Z125 Encounter for screening for malignant neoplasm of prostate: Secondary | ICD-10-CM

## 2021-11-17 DIAGNOSIS — R7309 Other abnormal glucose: Secondary | ICD-10-CM | POA: Diagnosis not present

## 2021-11-17 DIAGNOSIS — Z6824 Body mass index (BMI) 24.0-24.9, adult: Secondary | ICD-10-CM

## 2021-11-17 DIAGNOSIS — Z0000 Encounter for general adult medical examination without abnormal findings: Secondary | ICD-10-CM

## 2021-11-18 LAB — COMPREHENSIVE METABOLIC PANEL
AG Ratio: 1.5 (calc) (ref 1.0–2.5)
ALT: 17 U/L (ref 9–46)
AST: 18 U/L (ref 10–35)
Albumin: 4.2 g/dL (ref 3.6–5.1)
Alkaline phosphatase (APISO): 57 U/L (ref 35–144)
BUN: 15 mg/dL (ref 7–25)
CO2: 29 mmol/L (ref 20–32)
Calcium: 9.4 mg/dL (ref 8.6–10.3)
Chloride: 103 mmol/L (ref 98–110)
Creat: 0.98 mg/dL (ref 0.70–1.35)
Globulin: 2.8 g/dL (calc) (ref 1.9–3.7)
Glucose, Bld: 88 mg/dL (ref 65–99)
Potassium: 3.9 mmol/L (ref 3.5–5.3)
Sodium: 139 mmol/L (ref 135–146)
Total Bilirubin: 0.7 mg/dL (ref 0.2–1.2)
Total Protein: 7 g/dL (ref 6.1–8.1)

## 2021-11-18 LAB — CBC WITH DIFFERENTIAL/PLATELET
Absolute Monocytes: 656 cells/uL (ref 200–950)
Basophils Absolute: 21 cells/uL (ref 0–200)
Basophils Relative: 0.3 %
Eosinophils Absolute: 214 cells/uL (ref 15–500)
Eosinophils Relative: 3.1 %
HCT: 47.6 % (ref 38.5–50.0)
Hemoglobin: 15.8 g/dL (ref 13.2–17.1)
Lymphs Abs: 1663 cells/uL (ref 850–3900)
MCH: 29.8 pg (ref 27.0–33.0)
MCHC: 33.2 g/dL (ref 32.0–36.0)
MCV: 89.8 fL (ref 80.0–100.0)
MPV: 9.9 fL (ref 7.5–12.5)
Monocytes Relative: 9.5 %
Neutro Abs: 4347 cells/uL (ref 1500–7800)
Neutrophils Relative %: 63 %
Platelets: 254 10*3/uL (ref 140–400)
RBC: 5.3 10*6/uL (ref 4.20–5.80)
RDW: 12.9 % (ref 11.0–15.0)
Total Lymphocyte: 24.1 %
WBC: 6.9 10*3/uL (ref 3.8–10.8)

## 2021-11-18 LAB — PSA: PSA: 0.38 ng/mL (ref <=4.00)

## 2021-11-18 LAB — LIPID PANEL
Cholesterol: 169 mg/dL (ref <200)
HDL: 66 mg/dL (ref >=40)
LDL Cholesterol (Calc): 90 mg/dL (calc)
Non-HDL Cholesterol (Calc): 103 mg/dL (calc) (ref <130)
Total CHOL/HDL Ratio: 2.6 (calc) (ref <5.0)
Triglycerides: 50 mg/dL (ref <150)

## 2021-11-18 LAB — TSH: TSH: 5.59 mIU/L — ABNORMAL HIGH (ref 0.40–4.50)

## 2021-11-18 LAB — HEMOGLOBIN A1C
Hgb A1c MFr Bld: 5.4 % of total Hgb (ref <5.7)
Mean Plasma Glucose: 108 mg/dL
eAG (mmol/L): 6 mmol/L

## 2021-11-18 LAB — T4, FREE: Free T4: 1 ng/dL (ref 0.8–1.8)

## 2021-11-19 ENCOUNTER — Inpatient Hospital Stay: Payer: 59 | Attending: Internal Medicine

## 2021-11-19 ENCOUNTER — Inpatient Hospital Stay (HOSPITAL_BASED_OUTPATIENT_CLINIC_OR_DEPARTMENT_OTHER): Payer: 59 | Admitting: Internal Medicine

## 2021-11-19 VITALS — BP 108/71 | HR 55 | Temp 97.5°F | Resp 16 | Wt 171.4 lb

## 2021-11-19 DIAGNOSIS — C884 Extranodal marginal zone B-cell lymphoma of mucosa-associated lymphoid tissue [MALT-lymphoma]: Secondary | ICD-10-CM

## 2021-11-19 DIAGNOSIS — Z87891 Personal history of nicotine dependence: Secondary | ICD-10-CM | POA: Insufficient documentation

## 2021-11-19 DIAGNOSIS — Z85038 Personal history of other malignant neoplasm of large intestine: Secondary | ICD-10-CM | POA: Diagnosis not present

## 2021-11-19 DIAGNOSIS — Z800 Family history of malignant neoplasm of digestive organs: Secondary | ICD-10-CM | POA: Diagnosis not present

## 2021-11-19 DIAGNOSIS — C187 Malignant neoplasm of sigmoid colon: Secondary | ICD-10-CM

## 2021-11-19 LAB — CBC WITH DIFFERENTIAL/PLATELET
Abs Immature Granulocytes: 0.01 10*3/uL (ref 0.00–0.07)
Basophils Absolute: 0 10*3/uL (ref 0.0–0.1)
Basophils Relative: 0 %
Eosinophils Absolute: 0.2 10*3/uL (ref 0.0–0.5)
Eosinophils Relative: 4 %
HCT: 44.5 % (ref 39.0–52.0)
Hemoglobin: 15.6 g/dL (ref 13.0–17.0)
Immature Granulocytes: 0 %
Lymphocytes Relative: 22 %
Lymphs Abs: 1.5 10*3/uL (ref 0.7–4.0)
MCH: 30.3 pg (ref 26.0–34.0)
MCHC: 35.1 g/dL (ref 30.0–36.0)
MCV: 86.4 fL (ref 80.0–100.0)
Monocytes Absolute: 0.6 10*3/uL (ref 0.1–1.0)
Monocytes Relative: 10 %
Neutro Abs: 4.2 10*3/uL (ref 1.7–7.7)
Neutrophils Relative %: 64 %
Platelets: 234 10*3/uL (ref 150–400)
RBC: 5.15 MIL/uL (ref 4.22–5.81)
RDW: 13 % (ref 11.5–15.5)
WBC: 6.5 10*3/uL (ref 4.0–10.5)
nRBC: 0 % (ref 0.0–0.2)

## 2021-11-19 LAB — COMPREHENSIVE METABOLIC PANEL
ALT: 20 U/L (ref 0–44)
AST: 21 U/L (ref 15–41)
Albumin: 3.9 g/dL (ref 3.5–5.0)
Alkaline Phosphatase: 53 U/L (ref 38–126)
Anion gap: 5 (ref 5–15)
BUN: 14 mg/dL (ref 8–23)
CO2: 29 mmol/L (ref 22–32)
Calcium: 8.6 mg/dL — ABNORMAL LOW (ref 8.9–10.3)
Chloride: 101 mmol/L (ref 98–111)
Creatinine, Ser: 0.93 mg/dL (ref 0.61–1.24)
GFR, Estimated: >60 mL/min (ref >60)
Glucose, Bld: 95 mg/dL (ref 70–99)
Potassium: 4.4 mmol/L (ref 3.5–5.1)
Sodium: 135 mmol/L (ref 135–145)
Total Bilirubin: 0.7 mg/dL (ref 0.3–1.2)
Total Protein: 7.3 g/dL (ref 6.5–8.1)

## 2021-11-19 NOTE — Progress Notes (Signed)
[place] ?OFFICE PROGRESS NOTE ? ?Patient Care Team: ?Olin Hauser, DO as PCP - General (Family Medicine) ?Minna Merritts, MD as PCP - Cardiology (Cardiology) ? ? 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 ?# 2009- COLON CA STAGE III [T3N1 s/p Left hemi-colec [[1/13LN; [place] @ 61 yo]; MSI- H ? S/p FOLFOX x12 [finished Aug 2010] last colo [Nov 2016; Dr.Elliot] ? ?# Uveitis [on pred]/ RA. On Mxt [summer 2017] ? ?# Lung nodule [incidental on CT]; last Nov 2014- 52m RML- STABLE [no further fu] ? ?# ? MMR of colon  ? ?DIAGNOSIS: # MALTOMA of Left orbit- stage I ?# Colon ca stage III ?;GOALS: cure ? ?CURRENT/MOST RECENT THERAPY: surveillaince ? ?  ?History of colon cancer, stage III  ?05/07/2015 Initial Diagnosis  ? Colon carcinoma ([place] ? ?  ?Cancer of descending colon ([place] (Resolved)  ?05/05/2016 Initial Diagnosis  ? Cancer of descending colon ([place] ? ?  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 ?  ? ?INTERVAL HISTORY: Alone.  Ambulating independently. ? ?A very pleasant 61year old male patient with above history left orbital/ MALT lymphoma status post definitive radiation is here for follow-up. ? ?Continues to complain of poor left eye vision.  [facility]  Patient denies any worsening eye swelling.  He continues to have chronic discomfort from his uveitis.  ? ?Review of Systems  ?Constitutional:  Negative for chills, diaphoresis, fever, malaise/fatigue and weight loss.  ?HENT:  Negative for nosebleeds and sore throat.   ?Eyes:  Negative for double vision.  ?     Left eye chronic poor vision/runny eyes.  ?Respiratory:  Negative for cough, hemoptysis, sputum production, shortness of  breath and wheezing.   ?Cardiovascular:  Negative for chest pain, palpitations, orthopnea and leg swelling.  ?Gastrointestinal:  Negative for abdominal pain, blood in stool, constipation, diarrhea, heartburn, melena, nausea and vomiting.  ?Genitourinary:  Negative for dysuria, frequency and urgency.  ?Musculoskeletal:  Negative for back pain and joint pain.  ?Skin: Negative.  Negative for itching and rash.  ?Neurological:  Negative for dizziness, tingling, focal weakness, weakness and headaches.  ?Endo/Heme/Allergies:  Does not bruise/bleed easily.  ?Psychiatric/Behavioral:  Negative for depression. The patient is not nervous/anxious and does not have insomnia.   ? ? ?PAST MEDICAL HISTORY :  ?Past Medical History:  ?Diagnosis Date  ? Abnormal heart rhythm   ? Colon cancer ([place]   ? sigmoid  ? Dysrhythmia   ? Hemorrhoid   ? History of chemotherapy   ? FOLFOX  ? Lung nodule   ? RIGHT/ NO PROGRESSION  ? Mitral valve prolapse   ? Pt reports Dr. GRockey Situtold him NO MVP  ? Rheumatoid arthritis ([place]   ? ? ?PAST SURGICAL HISTORY :   ?Past Surgical History:  ?Procedure Laterality Date  ? AQUEOUS SHUNT Left 10/12/2016  ? Procedure: AQUEOUS SHUNT with scleral patch graft  Left;  Surgeon: ARonnell Freshwater MD;  [place] [facility]  Service: Ophthalmology;  Laterality: Left;  tube shunt  ? BIOPSY Left 10/12/2016  ? Procedure: BIOPSY (fresh & permanent specimens sent from same location)- conjunctiva left eye;  Surgeon: ARonnell Freshwater MD;  [place] [facility]  Service: Ophthalmology;  Laterality: Left;  ?  CATARACT EXTRACTION W/PHACO Left 03/01/2018  ? Procedure: CATARACT EXTRACTION PHACO AND INTRAOCULAR LENS PLACEMENT (IOC);  Surgeon: Birder Robson, MD;  Location: [facility];  Service: Ophthalmology;  Laterality: Left;  [place] 01:49.2 ?AP% 18.6 ?CDE 20.32 ?Fluid Pack Lot # [id number] H  ? COLON SURGERY    ? COLONOSCOPY  2013, 05/16/2008  ? ESOPHAGOGASTRODUODENOSCOPY  06/04/08  ? EYE SURGERY Left   ?  TUMOR BIOPSY  ? [place] REMOVAL  2010  ? PORTACATH PLACEMENT  06/2008  ? ? ?FAMILY HISTORY :   ?Family History  ?Problem Relation Age of Onset  ? Arthritis Mother   ? Colon polyps Mother   ? Hypothyroidism Mother   ? Heart murmur Father   ? Valvular heart disease Father 42  ? Colon cancer Maternal Grandfather 67  ? Rheumatologic disease Maternal Aunt   ? Prostate cancer Neg Hx   ? ? ?SOCIAL HISTORY:   ?Social History  ? ?Tobacco Use  ? Smoking status: Former  ?  Packs/day: 1.00  ?  Years: 24.00  ?  Pack years: 24.00  ?  Types: Cigarettes  ?  Start date: 04/15/2008  ?  Quit date: 07/27/2008  ?  Years since quitting: 13.3  ? Smokeless tobacco: Former  ?Vaping Use  ? Vaping Use: Never used  ?Substance Use Topics  ? Alcohol use: Yes  ?  Alcohol/week: 0.0 standard drinks  ?  Comment: occasional alcohol use-wine  ? Drug use: Not Currently  ?  Types: Marijuana  ?  Comment: last used 1989  ? ? ?ALLERGIES:  is allergic to lumigan [bimatoprost] and thorazine [chlorpromazine]. ? ?MEDICATIONS:  ?Current Outpatient Medications  ?Medication Sig Dispense Refill  ? Ascorbic Acid (VITAMIN C) 1000 MG tablet Take 1,000 mg by mouth daily.    ? aspirin EC 81 MG tablet Take 81 mg by mouth daily.    ? brimonidine-timolol (COMBIGAN) 0.2-0.5 % ophthalmic solution Place 1 drop into both eyes every 12 (twelve) hours.    ? Cholecalciferol (VITAMIN D) 2000 units CAPS Take 2,000 Units by mouth daily.    ? Difluprednate 0.05 % EMUL Place 1 drop into the left eye 2 (two) times daily.    ? Multiple Vitamins-Minerals (DAILY MULTI) TABS Take 1 tablet by mouth daily.    ? QUERCETIN PO Take 1,200 mg by mouth daily.     ? nitroGLYCERIN (NITROSTAT) 0.4 MG SL tablet Place 1 tablet (0.4 mg total) under the tongue every 5 (five) minutes as needed for chest pain. 25 tablet 3  ? ?No current facility-administered medications for this visit.  ? ? ?PHYSICAL EXAMINATION: ?ECOG PERFORMANCE STATUS: 0 - Asymptomatic ? ?BP 108/71   Pulse (!) 55   Temp (!) 97.5 ?F  (36.4 ?C)   Resp 16   Wt 171 lb 6.4 oz (77.7 kg)   BMI 22.61 kg/m?  ? ?Filed Weights  ? 11/19/21 1000  ?Weight: 171 lb 6.4 oz (77.7 kg)  ? ?Physical Exam ?Constitutional:   ?   Comments: Patient is alone.  ?HENT:  ?   Head: Normocephalic and atraumatic.  ?   Mouth/Throat:  ?   Pharynx: No oropharyngeal exudate.  ?Eyes:  ?   Pupils: Pupils are equal, round, and reactive to light.  ?Cardiovascular:  ?   Rate and Rhythm: Normal rate and regular rhythm.  ?Pulmonary:  ?   Effort: No respiratory distress.  ?   Breath sounds: No wheezing.  ?Abdominal:  ?   General: Bowel sounds are normal. There is no distension.  ?  Palpations: Abdomen is soft. There is no mass.  ?   Tenderness: There is no abdominal tenderness. There is no guarding or rebound.  ?Musculoskeletal:     ?   General: No tenderness. Normal range of motion.  ?   Cervical back: Normal range of motion and neck supple.  ?Skin: ?   General: Skin is warm.  ?Neurological:  ?   Mental Status: He is alert and oriented to person, place, and time.  ?Psychiatric:     ?   Mood and Affect: Affect normal.  ? ? ? ?LABORATORY DATA:  ?I have reviewed the data as listed ?   ?Component Value Date/Time  ? NA 135 11/19/2021 1028  ? NA 142 05/20/2020 1546  ? NA 138 05/03/2014 1041  ? K 4.4 11/19/2021 1028  ? K 3.8 05/03/2014 1041  ? CL 101 11/19/2021 1028  ? CL 103 05/03/2014 1041  ? CO2 29 11/19/2021 1028  ? CO2 30 05/03/2014 1041  ? GLUCOSE 95 11/19/2021 1028  ? GLUCOSE 102 (H) 05/03/2014 1041  ? BUN 14 11/19/2021 1028  ? BUN 16 05/20/2020 1546  ? BUN 15 05/03/2014 1041  ? CREATININE 0.93 11/19/2021 1028  ? CREATININE 0.98 11/17/2021 0845  ? CALCIUM 8.6 (L) 11/19/2021 1028  ? CALCIUM 9.2 05/03/2014 1041  ? PROT 7.3 11/19/2021 1028  ? PROT 7.5 05/03/2014 1041  ? ALBUMIN 3.9 11/19/2021 1028  ? ALBUMIN 3.6 05/03/2014 1041  ? AST 21 11/19/2021 1028  ? AST 19 05/03/2014 1041  ? ALT 20 11/19/2021 1028  ? ALT 20 05/03/2014 1041  ? ALKPHOS 53 11/19/2021 1028  ? ALKPHOS 52 05/03/2014  1041  ? BILITOT 0.7 11/19/2021 1028  ? BILITOT 0.7 05/03/2014 1041  ? GFRNONAA >60 11/19/2021 1028  ? GFRNONAA 82 09/17/2020 0814  ? GFRAA 95 09/17/2020 0814  ? ? ?No results found for: SPEP, UPEP ? ?Lab Res

## 2021-11-19 NOTE — Progress Notes (Signed)
Patient denies new problems/concerns today. ? ?Reports having a good appetite with 16 lb wt loss in the past year. ?

## 2021-11-19 NOTE — Assessment & Plan Note (Addendum)
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 S/p RT [jan 15th 2019]- STABLE.  ? ?# Clinically no evidence of recurrence.;  Continue surveillance without imaging.  [facility]ound [facility]- March 2021- WNL;  ? ?# Left eye discomfort/tearing-elated to allergies/underlying uveitis/scleritis.  STABLE/ [facility]- / Dr.Profilio; [facility]] ? ?#Stage III colon cancer-at 47 years;  NED- Due for colonoscopy [last 2020- Dr.Elliot].  Recommend follow-up with GI. ? ?#Disposition : ?# Follow up in 12 months-MD;labs-cbc/cmp/cea- Dr.B ? ?Cc; Dr.Karamgeglos ? ?

## 2021-11-20 LAB — CEA: CEA: 2 ng/mL (ref 0.0–4.7)

## 2021-11-21 ENCOUNTER — Telehealth: Payer: Self-pay

## 2021-11-21 NOTE — Telephone Encounter (Signed)
Patient informed. 

## 2021-11-21 NOTE — Telephone Encounter (Signed)
-----   Message from Cammie Sickle, MD sent at 11/20/2021  5:05 PM EDT ----- ?Please inform patient that his cancer marker- normal. Continue follow up as planned. ? ?GB ?

## 2021-11-28 ENCOUNTER — Encounter: Payer: Self-pay | Admitting: Family Medicine

## 2021-11-28 ENCOUNTER — Ambulatory Visit (INDEPENDENT_AMBULATORY_CARE_PROVIDER_SITE_OTHER): Payer: 59 | Admitting: Family Medicine

## 2021-11-28 VITALS — BP 110/68 | HR 74 | Wt 172.4 lb

## 2021-11-28 DIAGNOSIS — Z0000 Encounter for general adult medical examination without abnormal findings: Secondary | ICD-10-CM | POA: Diagnosis not present

## 2021-11-28 DIAGNOSIS — C884 Extranodal marginal zone b-cell lymphoma of mucosa-associated lymphoid tissue (malt-lymphoma) not having achieved remission: Secondary | ICD-10-CM

## 2021-11-28 DIAGNOSIS — Z6822 Body mass index (BMI) 22.0-22.9, adult: Secondary | ICD-10-CM | POA: Diagnosis not present

## 2021-11-28 DIAGNOSIS — R7989 Other specified abnormal findings of blood chemistry: Secondary | ICD-10-CM | POA: Diagnosis not present

## 2021-11-28 DIAGNOSIS — M059 Rheumatoid arthritis with rheumatoid factor, unspecified: Secondary | ICD-10-CM | POA: Diagnosis not present

## 2021-11-28 NOTE — Progress Notes (Signed)
? ?Subjective:  ? ? Patient ID: Benjamin Clarke, male    DOB: 11/23/1960, 61 y.o.   MRN: [id number] ? ?Benjamin Clarke is a 61 y.o. male presenting on 11/28/2021 for Annual Exam ? ? ?HPI ? ?Here for Annual Physical and Lab Review. ?  ?Specialists ?Oncology - [facility] - Dr Rogue Bussing ?Ophthalmology - [facility] - Dr Carmelina Dane ?  ?History of Low BP / Blood Pressure, monitoring. ?He has not had issues with low heart rate or other issues lately. ?  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 He says fam history of colon CA. ?  ?Chronic Uveitis / Iritis ?- Thought due to autoimmune ?- Lymphoma (thought may be due to medication was on humira, and radiation) ?- Rheumatoid Arthritis (positive markers) without symptoms ?  ?HYPERLIPIDEMIA: ?- Reports no concerns. Last lipid panel 10/2021, controlled on lifestyle ?- Not on Statin therapy. He is not interested at this time ?Taking ASA 81 daily ?- He has history of Cardiology work up years ago due to having history of arm pains and worry about cardiac issue he had ECHO, CT Coronary testing done, without significant blockage or cardiovascular concerns. ?  ?Additional / Follow-up topics ?  ?Elevated TSH ?Mild elevated TSH still but improved. Normal T4 ?Fam history mother with hypothyroidism on synthroid. ?He is asymptomatic ?  ?Glaucoma ?Followed by [facility], due for refraction and new lenses ?- required shunt after failed eye drops for pressure ?  ?PMH L Inguinal Hernia - he has episodes of this flaring up. ? Bowel regimen is better and more regulated, no more constipation. Prunes supplement. ? ?  ?Health Maintenance: ?  ?Colon CA Screening: Last Colonoscopy done 08/16/18 (done by Dr R[facility]), results unavailable, good for 3 years by report - will request copy of formal report.  Currently asymptomatic. Significant known family history of colon CA at earlier age. Next due in 2023 most likely. He will schedule with Jefm Bryant. ?  ?UTD Hep C screening negative, 06/03/2016 ?UTD HIV screening negative 08/2019 ?  ?Prostate CA Screening: Prior history of DRE reported normal >10 years ago. Last PSA 0.38 10/2021 (prior result 0.4 (10/2020). Currently minimal symptoms with mild nocturia x 1, and some residual urine leakage, slightly weaker urine flow. No known family history of prostate CA.  ?  ? ? ?  11/28/2021  ?  8:44 AM 11/05/2020  ?  8:35 AM 09/20/2019  ?  9:05 AM  ?Depression screen PHQ 2/9  ?Decreased Interest 0 0 0  ?Down, Depressed, Hopeless 0 0 0  ?PHQ - 2 Score 0 0 0  ? ? ?Past Medical History:  ?Diagnosis Date  ? Abnormal heart rhythm   ? Colon cancer ([place])   ? sigmoid  ? Dysrhythmia   ? Hemorrhoid   ? History of chemotherapy   ? FOLFOX  ? Lung nodule   ? RIGHT/ NO PROGRESSION  ? Mitral valve prolapse   ? Pt reports Dr. Rockey Situ told him NO MVP  ? Rheumatoid arthritis ([place])   ? ?Past Surgical History:  ?Procedure Laterality Date  ? AQUEOUS SHUNT Left 10/12/2016  ? Procedure: AQUEOUS SHUNT with scleral patch graft  Left;  Surgeon: Ronnell Freshwater, MD;  Location: [place];  Service: Ophthalmology;  Laterality: Left;  tube shunt  ? BIOPSY Left 10/12/2016  ?  Procedure: BIOPSY (fresh & permanent specimens sent from same location)- conjunctiva left eye;  Surgeon: Ronnell Freshwater, MD;  Location: [place];  Service: Ophthalmology;  Laterality: Left;  ? CATARACT EXTRACTION W/PHACO Left 03/01/2018  ? Procedure: CATARACT EXTRACTION PHACO AND INTRAOCULAR LENS PLACEMENT (IOC);  Surgeon: Birder Robson, MD;  Location: [facility];  Service: Ophthalmology;  Laterality: Left;  [place] 01:49.2 ?AP% 18.6 ?CDE 20.32 ?Fluid Pack Lot # [id number]  ? COLON SURGERY    ? COLONOSCOPY  2013, 05/16/2008  ? ESOPHAGOGASTRODUODENOSCOPY  06/04/08  ? EYE SURGERY Left   ? TUMOR BIOPSY  ? [place]  REMOVAL  2010  ? PORTACATH PLACEMENT  06/2008  ? ?Social History  ? ?Socioeconomic History  ? Marital status: Single  ?  Spouse name: Not on file  ? Number of children: Not on file  ? Years of education: College  ? Highest education level: Not on file  ?Occupational History  ? Not on file  ?Tobacco Use  ? Smoking status: Former  ?  Packs/day: 1.00  ?  Years: 24.00  ?  Pack years: 24.00  ?  Types: Cigarettes  ?  Start date: 04/15/2008  ?  Quit date: 07/27/2008  ?  Years since quitting: 13.3  ? Smokeless tobacco: Former  ?Vaping Use  ? Vaping Use: Never used  ?Substance and Sexual Activity  ? Alcohol use: Yes  ?  Alcohol/week: 0.0 standard drinks  ?  Comment: occasional alcohol use-wine  ? Drug use: Not Currently  ?  Types: Marijuana  ?  Comment: last used 1989  ? Sexual activity: Not on file  ?Other Topics Concern  ? Not on file  ?Social History Narrative  ? Retired. Single. Regularly exercises.   ? ?Social Determinants of Health  ? ?Financial Resource Strain: Not on file  ?Food Insecurity: Not on file  ?Transportation Needs: Not on file  ?Physical Activity: Not on file  ?Stress: Not on file  ?Social Connections: Not on file  ?Intimate Partner Violence: Not on file  ? ?Family History  ?Problem Relation Age of Onset  ? Arthritis Mother   ? Colon polyps Mother   ? Hypothyroidism Mother   ? Heart murmur Father   ? Valvular heart disease Father 90  ? Colon cancer Maternal Grandfather 51  ? Rheumatologic disease Maternal Aunt   ? Prostate cancer Neg Hx   ? ?Current Outpatient Medications on File Prior to Visit  ?Medication Sig  ? Ascorbic Acid (VITAMIN C) 1000 MG tablet Take 1,000 mg by mouth daily.  ? aspirin EC 81 MG tablet Take 81 mg by mouth daily.  ? brimonidine-timolol (COMBIGAN) 0.2-0.5 % ophthalmic solution Place 1 drop into both eyes every 12 (twelve) hours.  ? Cholecalciferol (VITAMIN D) 2000 units CAPS Take 2,000 Units by mouth daily.  ? Difluprednate 0.05 % EMUL Place 1 drop into the left eye 2 (two) times daily.   ? Multiple Vitamins-Minerals (DAILY MULTI) TABS Take 1 tablet by mouth daily.  ? QUERCETIN PO Take 1,200 mg by mouth daily.   ? nitroGLYCERIN (NITROSTAT) 0.4 MG SL tablet Place 1 tablet (0.4 mg total) under the tongue every 5 (five) minutes as needed for chest pain.  ? ?No current facility-administered medications on file prior to visit.  ? ? ?Review of Systems  ?Constitutional:  Negative for activity change, appetite change, chills, diaphoresis, fatigue and fever.  ?HENT:  Negative for congestion and hearing loss.   ?Eyes:  Negative for visual disturbance.  ?Respiratory:  Negative  for cough, chest tightness, shortness of breath and wheezing.   ?Cardiovascular:  Negative for chest pain, palpitations and leg swelling.  ?Gastrointestinal:  Negative for abdominal pain, constipation, diarrhea, nausea and vomiting.  ?Genitourinary:  Negative for dysuria, frequency and hematuria.  ?Musculoskeletal:  Negative for arthralgias and neck pain.  ?Skin:  Negative for rash.  ?Neurological:  Negative for dizziness, weakness, light-headedness, numbness and headaches.  ?Hematological:  Negative for adenopathy.  ?Psychiatric/Behavioral:  Negative for behavioral problems, dysphoric mood and sleep disturbance.   ?Per HPI unless specifically indicated above ? ? ?   ?Objective:  ?  ?BP 110/68   Pulse 74   Wt 172 lb 6.4 oz (78.2 kg)   SpO2 100%   BMI 22.75 kg/m?   ?Wt Readings from Last 3 Encounters:  ?11/28/21 172 lb 6.4 oz (78.2 kg)  ?11/19/21 171 lb 6.4 oz (77.7 kg)  ?11/19/20 187 lb (84.8 kg)  ?  ?Physical Exam ?Vitals and nursing note reviewed.  ?Constitutional:   ?   General: He is not in acute distress. ?   Appearance: He is well-developed. He is not diaphoretic.  ?   Comments: Well-appearing, comfortable, cooperative  ?HENT:  ?   Head: Normocephalic and atraumatic.  ?   Right Ear: There is impacted cerumen.  ?   Left Ear: There is impacted cerumen.  ?Eyes:  ?   General:     ?   Right eye: No discharge.     ?   Left eye: No  discharge.  ?   Conjunctiva/sclera: Conjunctivae normal.  ?   Pupils: Pupils are equal, round, and reactive to light.  ?Neck:  ?   Thyroid: No thyromegaly.  ?   Vascular: No carotid bruit.  ?Cardiovascular:  ?   Ra

## 2021-11-28 NOTE — Patient Instructions (Addendum)
Thank you for coming to the office today. ? ?Lab results look great overall ?PSA 0.4 negative result ?Cholesterol excellent results. No medication required ? ?Recent Labs  ?  11/17/21 ?0845  ?HGBA1C 5.4  ? ?TSH mild elevated still but overall T4 normal. No change at this time.  ? ?Please contact [facility] when ready to pursue repeat Colonoscopy. ? ?Or can switch to [facility] [place] - Dr Jonathon Bellows, Dr Marius Ditch, Dr Bonna Gains ? ?DUE for FASTING BLOOD WORK (no food or drink after midnight before the lab appointment, only water or coffee without cream/sugar on the morning of) ? ?SCHEDULE "Lab Only" visit in the morning at the clinic for lab draw in 1 YEAR ? ?- Make sure Lab Only appointment is at about 1 week before your next appointment, so that results will be available ? ?For Lab Results, once available within 2-3 days of blood draw, you can can log in to MyChart online to view your results and a brief explanation. Also, we can discuss results at next follow-up visit. ? ? ?Please schedule a Follow-up Appointment to: Return in about 1 year (around 11/29/2022) for 1 year fasting lab only then 1 week later Annual Physical. ? ?If you have any other questions or concerns, please feel free to call the office or send a message through [place]. You may also schedule an earlier appointment if necessary. ? ?Additionally, you may be receiving a survey about your experience at our office within a few days to 1 week by e-mail or mail. We value your feedback. ? ?Nobie Putnam, DO ?[place] ?

## 2022-03-20 ENCOUNTER — Ambulatory Visit: Payer: Self-pay | Admitting: *Deleted

## 2022-03-20 ENCOUNTER — Telehealth: Payer: Self-pay | Admitting: Family Medicine

## 2022-03-20 NOTE — Telephone Encounter (Signed)
Pt is calling to request a referral to a surgeon to discuss his hernia. And a referral to a dermatologist.CB- [phone]

## 2022-03-20 NOTE — Telephone Encounter (Signed)
Summary: Colonscopy advice   Pt is calling to ask can he have a colonoscopy with a large hernia     Patient states he has history of colon cancer and is due colonoscopy soon. Patient states he has noticed and increase in the size of his Inguinal hernia and would like to know if it is safe to have colonoscopy.  Appointment has been scheduled with provider for evaluation of hernia size and recommendations. Reason for Disposition  [1] Caller requesting NON-URGENT health information AND [2] PCP's office is the best resource  Answer Assessment - Initial Assessment Questions 1. REASON FOR CALL or QUESTION: "What is your reason for calling today?" or "How can I best help you?" or "What question do you have that I can help answer?"     Is it safe to have colonoscopy with hernia?  Protocols used: Information Only Call - No Triage-A-AH

## 2022-03-24 NOTE — Telephone Encounter (Signed)
Patient has already been scheduled for 04/10/22 to see me for this problem with hernia evaluation.  I cannot refer to Surgeon without evaluating this first in office.  We can refer to Derm at that time as well.  Nobie Putnam, [facility] 03/24/2022, 12:00 PM

## 2022-04-10 ENCOUNTER — Ambulatory Visit: Payer: 59 | Admitting: Family Medicine

## 2022-04-10 ENCOUNTER — Encounter: Payer: Self-pay | Admitting: Family Medicine

## 2022-04-10 VITALS — BP 109/64 | HR 66 | Ht 72.0 in | Wt 171.4 lb

## 2022-04-10 DIAGNOSIS — Z1283 Encounter for screening for malignant neoplasm of skin: Secondary | ICD-10-CM

## 2022-04-10 DIAGNOSIS — Z85038 Personal history of other malignant neoplasm of large intestine: Secondary | ICD-10-CM

## 2022-04-10 DIAGNOSIS — K4090 Unilateral inguinal hernia, without obstruction or gangrene, not specified as recurrent: Secondary | ICD-10-CM | POA: Diagnosis not present

## 2022-04-10 NOTE — Patient Instructions (Addendum)
Thank you for coming to the office today.  Referral to Dermatology  [facility]   [place], [place] [address] Hours: 8AM-5PM Phone: [phone]  Sarina Ser, MD  Screening for skin cancer  ----------------------------------  Referral to [facility] for Left Inguinal Hernia repair evaluation - discuss with them first and what your options are and then proceed w/ [facility] for colonoscopy. Possibly do both at once or schedule plan for one then the other.  No urgency with hernia today it is larger and I agree with repair.  [facility] Surgery Department [place], [place]  [address] [phone]  Dr Windell Moment recommended  -----------------   You most likely have an Left Inguinal Hernia.  This is caused by a weakness in your abdominal or groin muscles, and is caused by bowel or fatty tissue pushing through this weak spot causing pain and bulging.  Recommend a "Hernia Truss", try to wear this regularly (do not need to wear to bed if pain is improved), until it feels better and then only with activities - If it is not improving then you may need to wear it every day, especially if you cannot have a surgery to fix the hernia   You can find a Truss OTC at [place] can try a medical supply pharmacy like:    Try to find positions that give you most relief, likely laying down with head lower than body will allow the hernia bulging to go back into place and feel better.   May take Tylenol as needed. Recommend to start taking Tylenol Extra Strength '500mg'$  tabs - take 1 to 2 tabs per dose (max '1000mg'$ ) every 6-8 hours for pain (take regularly, don't skip a dose for next 7 days), max 24 hour daily dose is 6 tablets or '3000mg'$ . In the future you can repeat the same everyday Tylenol course for 1-2 weeks at a time.    Can try topical ice packs or muscle rub if burning nerve sensation.    If significant worsening pain or  you get bulging that does NOT go down or go away and CANNOT push back in, or nausea, vomiting, then it is very important to go directly to hospital ED for more immediate evaluation, as this can be a life threatening surgical emergency    Please schedule a Follow-up Appointment to: Return if symptoms worsen or fail to improve.  If you have any other questions or concerns, please feel free to call the office or send a message through [place]. You may also schedule an earlier appointment if necessary.  Additionally, you may be receiving a survey about your experience at our office within a few days to 1 week by e-mail or mail. We value your feedback.  Nobie Putnam, DO [place]

## 2022-04-10 NOTE — Progress Notes (Unsigned)
Subjective:    Patient ID: Benjamin Clarke, male    DOB: October 31, 1960, 61 y.o.   MRN: [id number]  Benjamin Clarke is a 61 y.o. male presenting on 04/10/2022 for Inguinal Hernia   HPI  [facility] following History of Colon Cancer Upcoming Colonoscopy scheduled in October has apt initially then they will schedule. He has noticed increased size inguinal hernia  Last eval on L inguinal Hernia 2 years ago 04/2020  He now reports interval significant increasing size in the inguinal hernia, with gravity bothering it if he is on his feet long periods of time. If lay down hernia usually goes back. Not really painful but can bother him at times.  Bowel regimen is better and more regulated, no more constipation. Prunes supplement.   Health Maintenance:  Colon CA Screening: Last Colonoscopy done 08/16/18 (done by Dr Rolly [facility]), results unavailable, good for 3 years by report - will request copy of formal report. Currently asymptomatic. Significant known family history of colon CA at earlier age. Next due in 2023 most likely. He will schedule with Jefm Bryant.     11/28/2021    8:44 AM 11/05/2020    8:35 AM 09/20/2019    9:05 AM  Depression screen PHQ 2/9  Decreased Interest 0 0 0  Down, Depressed, Hopeless 0 0 0  PHQ - 2 Score 0 0 0    Social History   Tobacco Use   Smoking status: Former    Packs/day: 1.00    Years: 24.00    Total pack years: 24.00    Types: Cigarettes    Start date: 04/15/2008    Quit date: 07/27/2008    Years since quitting: 13.7   Smokeless tobacco: Former  Scientific laboratory technician Use: Never used  Substance Use Topics   Alcohol use: Yes    Alcohol/week: 0.0 standard drinks of alcohol    Comment: occasional alcohol use-wine   Drug use: Not Currently    Types: Marijuana    Comment: last used 1989    Review of Systems Per HPI unless specifically indicated above     Objective:    BP 109/64   Pulse 66   Ht 6' (1.829 m)   Wt 171 lb 6.4 oz (77.7  kg)   SpO2 100%   BMI 23.25 kg/m   Wt Readings from Last 3 Encounters:  04/10/22 171 lb 6.4 oz (77.7 kg)  11/28/21 172 lb 6.4 oz (78.2 kg)  11/19/21 171 lb 6.4 oz (77.7 kg)    Physical Exam Vitals and nursing note reviewed.  Constitutional:      General: He is not in acute distress.    Appearance: He is well-developed. He is not diaphoretic.     Comments: Well-appearing, comfortable, cooperative  HENT:     Head: Normocephalic and atraumatic.  Eyes:     General:        Right eye: No discharge.        Left eye: No discharge.     Conjunctiva/sclera: Conjunctivae normal.  Neck:     Thyroid: No thyromegaly.  Cardiovascular:     Rate and Rhythm: Normal rate and regular rhythm.     Pulses: Normal pulses.     Heart sounds: Normal heart sounds. No murmur heard. Pulmonary:     Effort: Pulmonary effort is normal. No respiratory distress.     Breath sounds: Normal breath sounds. No wheezing or rales.  Abdominal:     Hernia: A hernia (moderate  sized Left inguinal hernia on exam) is present.  Musculoskeletal:        General: Normal range of motion.     Cervical back: Normal range of motion and neck supple.  Lymphadenopathy:     Cervical: No cervical adenopathy.  Skin:    General: Skin is warm and dry.     Findings: No erythema or rash.  Neurological:     Mental Status: He is alert and oriented to person, place, and time. Mental status is at baseline.  Psychiatric:        Behavior: Behavior normal.     Comments: Well groomed, good eye contact, normal speech and thoughts    Results for orders placed or performed in visit on 11/19/21  CEA  Result Value Ref Range   CEA 2.0 0.0 - 4.7 ng/mL  Comprehensive metabolic panel  Result Value Ref Range   Sodium 135 135 - 145 mmol/L   Potassium 4.4 3.5 - 5.1 mmol/L   Chloride 101 98 - 111 mmol/L   CO2 29 22 - 32 mmol/L   Glucose, Bld 95 70 - 99 mg/dL   BUN 14 8 - 23 mg/dL   Creatinine, Ser 0.93 0.61 - 1.24 mg/dL   Calcium 8.6 (L) 8.9  - 10.3 mg/dL   Total Protein 7.3 6.5 - 8.1 g/dL   Albumin 3.9 3.5 - 5.0 g/dL   AST 21 15 - 41 U/L   ALT 20 0 - 44 U/L   Alkaline Phosphatase 53 38 - 126 U/L   Total Bilirubin 0.7 0.3 - 1.2 mg/dL   GFR, Estimated >60 >60 mL/min   Anion gap 5 5 - 15  CBC with Differential  Result Value Ref Range   WBC 6.5 4.0 - 10.5 K/uL   RBC 5.15 4.22 - 5.81 MIL/uL   Hemoglobin 15.6 13.0 - 17.0 g/dL   HCT 44.5 39.0 - 52.0 %   MCV 86.4 80.0 - 100.0 fL   MCH 30.3 26.0 - 34.0 pg   MCHC 35.1 30.0 - 36.0 g/dL   RDW 13.0 11.5 - 15.5 %   Platelets 234 150 - 400 K/uL   nRBC 0.0 0.0 - 0.2 %   Neutrophils Relative % 64 %   Neutro Abs 4.2 1.7 - 7.7 K/uL   Lymphocytes Relative 22 %   Lymphs Abs 1.5 0.7 - 4.0 K/uL   Monocytes Relative 10 %   Monocytes Absolute 0.6 0.1 - 1.0 K/uL   Eosinophils Relative 4 %   Eosinophils Absolute 0.2 0.0 - 0.5 K/uL   Basophils Relative 0 %   Basophils Absolute 0.0 0.0 - 0.1 K/uL   Immature Granulocytes 0 %   Abs Immature Granulocytes 0.01 0.00 - 0.07 K/uL      Assessment & Plan:   Problem List Items Addressed This Visit     History of colon cancer, stage III   Relevant Orders   Ambulatory referral to General Surgery   Other Visit Diagnoses     Left inguinal hernia    -  Primary   Relevant Orders   Ambulatory referral to General Surgery   Skin cancer screening       Relevant Orders   Ambulatory referral to Dermatology       Large left inguinal hernia, chronic problem worsening now requesting surgical evaluation and future surgery to repair, he has history of colon cancer stage III treated previously, he has upcoming [place] consult in October for next colonoscopy and asks about interference of  the inguinal hernia related to the colonoscopy   Proceed w/ referral to Gen Surgery and they can consult regarding hernia and indications for repair. Additionall goal to find out how to proceed in relation to upcoming planned Colonoscopy.  Return precautions  if acute worse hernia symptoms.  Referral to Dermatology for skin cancer screening routine TBSE   Orders Placed This Encounter  Procedures   Ambulatory referral to General Surgery    Referral Priority:   Routine    Referral Type:   Surgical    Referral Reason:   Specialty Services Required    Requested Specialty:   General Surgery    Number of Visits Requested:   1   Ambulatory referral to Dermatology    Referral Priority:   Routine    Referral Type:   Consultation    Referral Reason:   Specialty Services Required    Requested Specialty:   Dermatology    Number of Visits Requested:   1     No orders of the defined types were placed in this encounter.    Follow up plan: Return if symptoms worsen or fail to improve.   Nobie Putnam, [facility] 04/10/2022, 8:38 AM

## 2022-04-21 ENCOUNTER — Ambulatory Visit: Payer: Self-pay | Admitting: General Surgery

## 2022-04-21 DIAGNOSIS — K4020 Bilateral inguinal hernia, without obstruction or gangrene, not specified as recurrent: Secondary | ICD-10-CM | POA: Diagnosis not present

## 2022-04-21 NOTE — H&P (Signed)
PATIENT PROFILE: Benjamin Clarke is a 61 y.o. male who presents to the Clinic for consultation at the request of Dr. Parks Ranger for evaluation of inguinal hernia.  PCP:  Olin Hauser, DO  HISTORY OF PRESENT ILLNESS: [name] reports he has been having a bulge in the left groin since a year ago.  Initially he was very small did not have any symptoms.  He endorses that the last few month he has been growing in size and now he is uncomfortable.  He endorses some pain with certain movement.  No alleviating factors.  He endorses that he is able to reduce the hernia when he lays down.  He denies any episode of abdominal distention nausea or vomiting.  Patient had history of partial colectomy due to colon cancer.  Last colonoscopy in 2020 negative for malignancy.  He had a CT scan in 2018 without any sign of inguinal hernia.  I personally evaluated the images.   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 CT of the chest abdomen pelvis-negative for malignancy.   # Discussed with the patient that biopsy would be needed for further evaluation/ and recommendations for treatment. He is awaiting evaluation with [facility] oculoplastic department.   # COLON CA STAGE III  [2009] s/p FOLFOX; clinically no evidence of recurrence noted. CT chest abdomen pelvis negative for any malignancy [September 2018]. CEA pending today.  # ? Autoimmune- uveitis/scleritis/question rheumatoid arthritis- patient is currently started on infliximab as per rheumatology/Dr.Kernodle. Would continue infliximab at this time  # follow up in 4 weeks or sooner based on above work up at [facility]. Patient was given a copy of the report of the imaging/MRI disk. We'll also fax the MRI report/send disk to Dr.Jaffe.   # The above plan of care was discussed with Dr. Mina Marble, [facility]; and also with Dr.Kernodle.      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   Age-related nuclear cataract of both eyes 09/09/2016   Posterior scleritis of left eye 02/06/2015   Vitreomacular adhesion of both eyes 02/06/2015   Rheumatoid arthritis (CMS-HCC) 02/06/2015   Overview    IMO update      Uveitic glaucoma of left eye, indeterminate stage 11/30/2014   Adenocarcinoma of sigmoid colon (CMS-HCC) Unknown   Overview    Status post sigmoid colectomy      Mitral valve prolapse Unknown   Cluster headaches Unknown    GENERAL REVIEW OF SYSTEMS:   General ROS: negative for - chills, fatigue, fever, weight gain or weight loss Allergy and Immunology ROS: negative for - hives  Hematological and Lymphatic ROS: negative for - bleeding problems or bruising, negative for palpable nodes Endocrine ROS: negative for - heat or cold intolerance, hair changes Respiratory ROS: negative for -  cough, shortness of breath or wheezing Cardiovascular ROS: no chest pain or palpitations GI ROS: negative for nausea, vomiting, abdominal pain, diarrhea, constipation Musculoskeletal ROS: negative for - joint swelling or muscle pain Neurological ROS: negative for - confusion, syncope Dermatological ROS: negative for pruritus and rash Psychiatric: negative for anxiety, depression, difficulty sleeping and memory  loss  MEDICATIONS: Current Outpatient Medications  Medication Sig Dispense Refill   ascorbic acid, vitamin C, (VITAMIN C) 1000 MG tablet Take 1,000 mg by mouth once daily     aspirin 81 MG EC tablet Take 81 mg by mouth once daily     beta-carotene,A,-vits C,E/mins (OCUVITE ORAL) Take by mouth once daily     brimonidine-timoloL (COMBIGAN) 0.2-0.5 % ophthalmic solution Place 1 drop into the left eye 2 (two) times daily     cholecalciferol (VITAMIN D3) 2,000 unit tablet Take 2,000 Units by mouth once daily     difluprednate (DUREZOL) 0.05 % ophthalmic emulsion Place 1 drop into the left eye once daily 5 mL 8   multivitamin with minerals tablet Take 1 tablet by mouth every morning       brimonidine (ALPHAGAN P) 0.1 % ophthalmic solution Place 1 drop into the left eye 2 (two) times daily (Patient not taking: Reported on 10/11/2019) 5 mL 11   calcium carbonate-vitamin D3 (OS-CAL 500+D) 500 mg(1,'250mg'$ ) -400 unit tablet Take 1 tablet by mouth once daily (Patient not taking: Reported on 04/21/2022)     ciclopirox (LOPROX) 0.77 % cream Apply topically (Patient not taking: Reported on 07/17/2020)     UNABLE TO FIND ANTI-FUNGAL CREME - leftover from mother - helping resolve raised red spots left leg (Patient not taking: Reported on 07/24/2020)     No current facility-administered medications for this visit.    ALLERGIES: Chlorpromazine  PAST MEDICAL HISTORY: Past Medical History:  Diagnosis Date   Acute anterior uveitis of left eye    Adenocarcinoma of sigmoid colon (CMS-HCC) 04/2008   Status post sigmoid colectomy   Arthritis, rheumatoid (CMS-HCC) 04/2014   B-cell lymphoma (CMS-HCC)    Cataract cortical, senile    Cluster headaches    COPD (chronic obstructive pulmonary disease) (CMS-HCC)    early stages   GERD (gastroesophageal reflux disease)    Glaucoma    Hemorrhoids    High myopia, both eyes 12/01/2017   History of colon cancer    Iritis    Mitral valve prolapse    Posterior  scleritis of left eye     PAST SURGICAL HISTORY: Past Surgical History:  Procedure Laterality Date   Sigmoid colon resection  04/2008   Dr Bronson Ing   COLON SURGERY  04/2008   [place] [facility]   COLONOSCOPY  05/16/2008   Colon Mass w/Adenocarcinoma   COLONOSCOPY  07/02/2015   PHCC: CBF 06/2018: Recall ltr mailed 06/01/18   EYE SURGERY Left 08/2016   tube shunt   GLAUCOMA EYE SURGERY Left 10/12/2016   Dr. Dorthula Nettles - [place], [place]   ORBITOTOMY EXPLORATION W/WO BIOPSY Left 05/31/2017   Procedure: LEFT EYE ORBITOTOMY EXPLORATION WITH OR WITHOUT BIOPSY;  Surgeon: Dorisann Frames, MD;  Location: [place];  Service: Ophthalmology;  Laterality: Left;   LENS EYE SURGERY Left 02/2018   Dr. Gwyndolyn Saxon Profilio- [facility]   COLONOSCOPY  08/16/2018   PHCC: CBF 07/2020  (01/02/2022 recall letter returned.awb)   COLONOSCOPY  04/27/2012, 07/24/2010, 07/03/2009   PHCC: CBF 04/2015; Recall Ltr mailed 03/11/2015 ([facility])   Gum surgery     age 42 or 55  FAMILY HISTORY: Family History  Problem Relation Age of Onset   Colon polyps Mother    Cataracts Mother    High blood pressure (Hypertension) Mother    Stomach cancer Maternal Aunt    Arthritis Maternal Aunt        Rheumatoid   Colon cancer Maternal Grandfather    Valvular heart disease Other    Cataracts Father    Macular degeneration Father    Blindness Neg Hx    Glaucoma Neg Hx    Vision loss Neg Hx    Thyroid disease Neg Hx    Diabetes Neg Hx      SOCIAL HISTORY: Social History   Socioeconomic History   Marital status: Single  Tobacco Use   Smoking status: Former    Packs/day: 1.50    Years: 25.00    Pack years: 37.50    Types: Cigarettes    Start date: 05/18/2008   Smokeless tobacco: Never  Vaping Use   Vaping Use: Never used  Substance and Sexual Activity   Alcohol use: Yes    Alcohol/week: 0.0 standard drinks    Comment: 2 glasses of wine year   Drug use: No    PHYSICAL EXAM: Vitals:   04/21/22 0930  BP: 106/76   Pulse: 60   Body mass index is 23.19 kg/m. Weight: 77.6 kg (171 lb)   GENERAL: Alert, active, oriented x3  HEENT: Pupils equal reactive to light. Extraocular movements are intact. Sclera clear. Palpebral conjunctiva normal red color.Pharynx clear.  NECK: Supple with no palpable mass and no adenopathy.  LUNGS: Sound clear with no rales rhonchi or wheezes.  HEART: Regular rhythm S1 and S2 without murmur.  ABDOMEN: Soft and depressible, nontender with no palpable mass, no hepatomegaly.  Large left inguinal hernia, reducible.  Small right inguinal hernia, reducible.  EXTREMITIES: Well-developed well-nourished symmetrical with no dependent edema.  NEUROLOGICAL: Awake alert oriented, facial expression symmetrical, moving all extremities.  REVIEW OF DATA: I have reviewed the following data today: No visits with results within 3 Month(s) from this visit.  Latest known visit with results is:  Office Visit on 01/29/2021  Component Date Value   AVG THICKNESS (OS) 01/29/2021 283    AVG THICKNESS (OD) 01/29/2021 300    MACULA VOL (OS) 01/29/2021 7.54    MACULA VOL (OD) 01/29/2021 8.40      ASSESSMENT: Mr. Carnero is a 61 y.o. male presenting for consultation for left versus bilateral inguinal hernia.    The patient presents with a symptomatic, reducible left versus bilateral inguinal hernia. Patient was oriented about the diagnosis of inguinal hernia and its implication. The patient was oriented about the treatment alternatives (observation vs surgical repair). Due to patient symptoms, repair is recommended. Patient oriented about the surgical procedure, the use of mesh and its risk of complications such as: infection, bleeding, injury to vas deference, vasculature and testicle, injury to bowel or bladder, and chronic pain.   Bilateral inguinal hernia without obstruction or gangrene, recurrence not specified [K40.20]  PLAN: 1.  Robotic assisted laparoscopic left versus bilateral  inguinal hernia repair with mesh (25427) 2.  Avoid taking aspirin 5 days before procedure 4.  Contact us if has any question or concern.   Patient verbalized understanding, all questions were answered, and were agreeable with the plan outlined above.    Herbert Pun, MD

## 2022-04-21 NOTE — H&P (View-Only) (Signed)
PATIENT PROFILE: Benjamin Clarke is a 61 y.o. male who presents to the Clinic for consultation at the request of Dr. Parks Ranger for evaluation of inguinal hernia.  PCP:  Olin Hauser, DO  HISTORY OF PRESENT ILLNESS: Benjamin Clarke reports he has been having a bulge in the left groin since a year ago.  Initially he was very small did not have any symptoms.  He endorses that the last few month he has been growing in size and now he is uncomfortable.  He endorses some pain with certain movement.  No alleviating factors.  He endorses that he is able to reduce the hernia when he lays down.  He denies any episode of abdominal distention nausea or vomiting.  Patient had history of partial colectomy due to colon cancer.  Last colonoscopy in 2020 negative for malignancy.  He had a CT scan in 2018 without any sign of inguinal hernia.  I personally evaluated the images.   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 CT of the chest abdomen pelvis-negative for malignancy.   # Discussed with the patient that biopsy would be needed for further evaluation/ and recommendations for treatment. He is awaiting evaluation with [facility] oculoplastic department.   # COLON CA STAGE III  [2009] s/p FOLFOX; clinically no evidence of recurrence noted. CT chest abdomen pelvis negative for any malignancy [September 2018]. CEA pending today.  # ? Autoimmune- uveitis/scleritis/question rheumatoid arthritis- patient is currently started on infliximab as per rheumatology/Dr.Kernodle. Would continue infliximab at this time  # follow up in 4 weeks or sooner based on above work up at [facility] Patient was given a copy of the report of the imaging/MRI disk. We'll also fax the MRI report/send disk to Dr.Jaffe.   # The above plan of care was discussed with Dr. Mina Marble, [facility]; and also with Dr.Kernodle.      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   Age-related nuclear cataract of both eyes 09/09/2016   Posterior scleritis of left eye 02/06/2015   Vitreomacular adhesion of both eyes 02/06/2015   Rheumatoid arthritis (CMS-HCC) 02/06/2015   Overview    IMO update      Uveitic glaucoma of left eye, indeterminate stage 11/30/2014   Adenocarcinoma of sigmoid colon (CMS-HCC) Unknown   Overview    Status post sigmoid colectomy      Mitral valve prolapse Unknown   Cluster headaches Unknown    GENERAL REVIEW OF SYSTEMS:   General ROS: negative for - chills, fatigue, fever, weight gain or weight loss Allergy and Immunology ROS: negative for - hives  Hematological and Lymphatic ROS: negative for - bleeding problems or bruising, negative for palpable nodes Endocrine ROS: negative for - heat or cold intolerance, hair changes Respiratory ROS: negative for -  cough, shortness of breath or wheezing Cardiovascular ROS: no chest pain or palpitations GI ROS: negative for nausea, vomiting, abdominal pain, diarrhea, constipation Musculoskeletal ROS: negative for - joint swelling or muscle pain Neurological ROS: negative for - confusion, syncope Dermatological ROS: negative for pruritus and rash Psychiatric: negative for anxiety, depression, difficulty sleeping and memory  loss  MEDICATIONS: Current Outpatient Medications  Medication Sig Dispense Refill   ascorbic acid, vitamin C, (VITAMIN C) 1000 MG tablet Take 1,000 mg by mouth once daily     aspirin 81 MG EC tablet Take 81 mg by mouth once daily     beta-carotene,A,-vits C,E/mins (OCUVITE ORAL) Take by mouth once daily     brimonidine-timoloL (COMBIGAN) 0.2-0.5 % ophthalmic solution Place 1 drop into the left eye 2 (two) times daily     cholecalciferol (VITAMIN D3) 2,000 unit tablet Take 2,000 Units by mouth once daily     difluprednate (DUREZOL) 0.05 % ophthalmic emulsion Place 1 drop into the left eye once daily 5 mL 8   multivitamin with minerals tablet Take 1 tablet by mouth every morning       brimonidine (ALPHAGAN P) 0.1 % ophthalmic solution Place 1 drop into the left eye 2 (two) times daily (Patient not taking: Reported on 10/11/2019) 5 mL 11   calcium carbonate-vitamin D3 (OS-CAL 500+D) 500 mg(1,'250mg'$ ) -400 unit tablet Take 1 tablet by mouth once daily (Patient not taking: Reported on 04/21/2022)     ciclopirox (LOPROX) 0.77 % cream Apply topically (Patient not taking: Reported on 07/17/2020)     UNABLE TO FIND ANTI-FUNGAL CREME - leftover from mother - helping resolve raised red spots left leg (Patient not taking: Reported on 07/24/2020)     No current facility-administered medications for this visit.    ALLERGIES: Chlorpromazine  PAST MEDICAL HISTORY: Past Medical History:  Diagnosis Date   Acute anterior uveitis of left eye    Adenocarcinoma of sigmoid colon (CMS-HCC) 04/2008   Status post sigmoid colectomy   Arthritis, rheumatoid (CMS-HCC) 04/2014   B-cell lymphoma (CMS-HCC)    Cataract cortical, senile    Cluster headaches    COPD (chronic obstructive pulmonary disease) (CMS-HCC)    early stages   GERD (gastroesophageal reflux disease)    Glaucoma    Hemorrhoids    High myopia, both eyes 12/01/2017   History of colon cancer    Iritis    Mitral valve prolapse    Posterior  scleritis of left eye     PAST SURGICAL HISTORY: Past Surgical History:  Procedure Laterality Date   Sigmoid colon resection  04/2008   Dr Bronson Ing   COLON SURGERY  04/2008   [place] [facility]   COLONOSCOPY  05/16/2008   Colon Mass w/Adenocarcinoma   COLONOSCOPY  07/02/2015   PHCC: CBF 06/2018: Recall ltr mailed 06/01/18   EYE SURGERY Left 08/2016   tube shunt   GLAUCOMA EYE SURGERY Left 10/12/2016   Dr. Dorthula Nettles - [place], [place]   ORBITOTOMY EXPLORATION W/WO BIOPSY Left 05/31/2017   Procedure: LEFT EYE ORBITOTOMY EXPLORATION WITH OR WITHOUT BIOPSY;  Surgeon: Dorisann Frames, MD;  Location: [place];  Service: Ophthalmology;  Laterality: Left;   LENS EYE SURGERY Left 02/2018   Dr. Gwyndolyn Saxon Profilio- [facility]   COLONOSCOPY  08/16/2018   PHCC: CBF 07/2020  (01/02/2022 recall letter returned.awb)   COLONOSCOPY  04/27/2012, 07/24/2010, 07/03/2009   PHCC: CBF 04/2015; Recall Ltr mailed 03/11/2015 (dw)   Gum surgery     age 63 or 49  FAMILY HISTORY: Family History  Problem Relation Age of Onset   Colon polyps Mother    Cataracts Mother    High blood pressure (Hypertension) Mother    Stomach cancer Maternal Aunt    Arthritis Maternal Aunt        Rheumatoid   Colon cancer Maternal Grandfather    Valvular heart disease Other    Cataracts Father    Macular degeneration Father    Blindness Neg Hx    Glaucoma Neg Hx    Vision loss Neg Hx    Thyroid disease Neg Hx    Diabetes Neg Hx      SOCIAL HISTORY: Social History   Socioeconomic History   Marital status: Single  Tobacco Use   Smoking status: Former    Packs/day: 1.50    Years: 25.00    Pack years: 37.50    Types: Cigarettes    Start date: 05/18/2008   Smokeless tobacco: Never  Vaping Use   Vaping Use: Never used  Substance and Sexual Activity   Alcohol use: Yes    Alcohol/week: 0.0 standard drinks    Comment: 2 glasses of wine year   Drug use: No    PHYSICAL EXAM: Vitals:   04/21/22 0930  BP: 106/76   Pulse: 60   Body mass index is 23.19 kg/m. Weight: 77.6 kg (171 lb)   GENERAL: Alert, active, oriented x3  HEENT: Pupils equal reactive to light. Extraocular movements are intact. Sclera clear. Palpebral conjunctiva normal red color.Pharynx clear.  NECK: Supple with no palpable mass and no adenopathy.  LUNGS: Sound clear with no rales rhonchi or wheezes.  HEART: Regular rhythm S1 and S2 without murmur.  ABDOMEN: Soft and depressible, nontender with no palpable mass, no hepatomegaly.  Large left inguinal hernia, reducible.  Small right inguinal hernia, reducible.  EXTREMITIES: Well-developed well-nourished symmetrical with no dependent edema.  NEUROLOGICAL: Awake alert oriented, facial expression symmetrical, moving all extremities.  REVIEW OF DATA: I have reviewed the following data today: No visits with results within 3 Month(s) from this visit.  Latest known visit with results is:  Office Visit on 01/29/2021  Component Date Value   AVG THICKNESS (OS) 01/29/2021 283    AVG THICKNESS (OD) 01/29/2021 300    MACULA VOL (OS) 01/29/2021 7.54    MACULA VOL (OD) 01/29/2021 8.40      ASSESSMENT: Mr. Holzmann is a 61 y.o. male presenting for consultation for left versus bilateral inguinal hernia.    The patient presents with a symptomatic, reducible left versus bilateral inguinal hernia. Patient was oriented about the diagnosis of inguinal hernia and its implication. The patient was oriented about the treatment alternatives (observation vs surgical repair). Due to patient symptoms, repair is recommended. Patient oriented about the surgical procedure, the use of mesh and its risk of complications such as: infection, bleeding, injury to vas deference, vasculature and testicle, injury to bowel or bladder, and chronic pain.   Bilateral inguinal hernia without obstruction or gangrene, recurrence not specified [K40.20]  PLAN: 1.  Robotic assisted laparoscopic left versus bilateral  inguinal hernia repair with mesh (02542) 2.  Avoid taking aspirin 5 days before procedure 4.  Contact us if has any question or concern.   Patient verbalized understanding, all questions were answered, and were agreeable with the plan outlined above.    Herbert Pun, MD

## 2022-05-04 ENCOUNTER — Other Ambulatory Visit: Payer: Self-pay

## 2022-05-04 ENCOUNTER — Encounter
Admission: RE | Admit: 2022-05-04 | Discharge: 2022-05-04 | Disposition: A | Discharge status: Home / Self Care | Payer: 59 | Source: Ambulatory Visit | Attending: General Surgery | Admitting: General Surgery

## 2022-05-04 DIAGNOSIS — Z01812 Encounter for preprocedural laboratory examination: Secondary | ICD-10-CM

## 2022-05-04 HISTORY — DX: Headache, unspecified: R51.9

## 2022-05-04 HISTORY — DX: Angina pectoris, unspecified: I20.9

## 2022-05-04 NOTE — Patient Instructions (Addendum)
Your procedure is scheduled on: 05/11/22 - Monday Report to the Registration Desk on the 1st floor of the [facility]. To find out your arrival time, please call [phone] between 1PM - 3PM on: 05/08/22 - Friday If your arrival time is 6:00 am, do not arrive prior to that time as the [facility] entrance doors do not open until 6:00 am.  REMEMBER: Instructions that are not followed completely may result in serious medical risk, up to and including death; or upon the discretion of your surgeon and anesthesiologist your surgery may need to be rescheduled.  Do not eat food or drink any fluids after midnight the night before surgery.  No gum chewing, lozengers or hard candies.  TAKE THESE MEDICATIONS THE MORNING OF SURGERY WITH A SIP OF WATER:  - brimonidine-timolol (COMBIGAN)  - Difluprednate 0.05 % EMUL  HOLD ASPIRIN for 5 days prior to your surgery.  STOP One week prior to surgery: Stop Anti-inflammatories (NSAIDS) such as Advil, Aleve, Ibuprofen, Motrin, Naproxen, Naprosyn and Aspirin based products such as Excedrin, Goodys Powder, BC Powder.  Stop 1 week prior to your procedure, ANY OVER THE COUNTER supplements until after surgery.Ascorbic Acid (VITAMIN C), Multiple Vitamins, VITAMIN D.  You may however, continue to take Tylenol if needed for pain up until the day of surgery.  No Alcohol for 24 hours before or after surgery.  No Smoking including e-cigarettes for 24 hours prior to surgery.  No chewable tobacco products for at least 6 hours prior to surgery.  No nicotine patches on the day of surgery.  Do not use any "recreational" drugs for at least a week prior to your surgery.  Please be advised that the combination of cocaine and anesthesia may have negative outcomes, up to and including death. If you test positive for cocaine, your surgery will be cancelled.  On the morning of surgery brush your teeth with toothpaste and water, you may rinse your mouth with mouthwash if  you wish. Do not swallow any toothpaste or mouthwash.  Use CHG Soap or wipes as directed on instruction sheet.  Do not wear jewelry, make-up, hairpins, clips or nail polish.  Do not wear lotions, powders, or perfumes.   Do not shave body from the neck down 48 hours prior to surgery just in case you cut yourself which could leave a site for infection.  Also, freshly shaved skin may become irritated if using the CHG soap.  Contact lenses, hearing aids and dentures may not be worn into surgery.  Do not bring valuables to the hospital. [facility] is not responsible for any missing/lost belongings or valuables.   Notify your doctor if there is any change in your medical condition (cold, fever, infection).  Wear comfortable clothing (specific to your surgery type) to the hospital.  After surgery, you can help prevent lung complications by doing breathing exercises.  Take deep breaths and cough every 1-2 hours. Your doctor may order a device called an Incentive Spirometer to help you take deep breaths. When coughing or sneezing, hold a pillow firmly against your incision with both hands. This is called "splinting." Doing this helps protect your incision. It also decreases belly discomfort.  If you are being admitted to the hospital overnight, leave your suitcase in the car. After surgery it may be brought to your room.  If you are being discharged the day of surgery, you will not be allowed to drive home. You will need a responsible adult (18 years or older) to  drive you home and stay with you that night.   If you are taking public transportation, you will need to have a responsible adult (18 years or older) with you. Please confirm with your physician that it is acceptable to use public transportation.   Please call the [facility] Dept. at [phone] if you have any questions about these instructions.  Surgery Visitation Policy:  Patients undergoing a surgery or  procedure may have two family members or support persons with them as long as the person is not COVID-19 positive or experiencing its symptoms.   Inpatient Visitation:    Visiting hours are 7 a.m. to 8 p.m. Up to four visitors are allowed at one time in a patient room, including children. The visitors may rotate out with other people during the day. One designated support person (adult) may remain overnight.

## 2022-05-06 ENCOUNTER — Encounter: Payer: Self-pay | Admitting: Urgent Care

## 2022-05-06 ENCOUNTER — Encounter
Admission: RE | Admit: 2022-05-06 | Discharge: 2022-05-06 | Disposition: A | Discharge status: Home / Self Care | Payer: 59 | Source: Ambulatory Visit | Attending: General Surgery | Admitting: General Surgery

## 2022-05-06 DIAGNOSIS — Z01818 Encounter for other preprocedural examination: Secondary | ICD-10-CM | POA: Diagnosis not present

## 2022-05-06 DIAGNOSIS — Z01812 Encounter for preprocedural laboratory examination: Secondary | ICD-10-CM

## 2022-05-06 DIAGNOSIS — Z85038 Personal history of other malignant neoplasm of large intestine: Secondary | ICD-10-CM | POA: Diagnosis not present

## 2022-05-06 LAB — BASIC METABOLIC PANEL
Anion gap: 7 (ref 5–15)
BUN: 14 mg/dL (ref 8–23)
CO2: 27 mmol/L (ref 22–32)
Calcium: 9 mg/dL (ref 8.9–10.3)
Chloride: 106 mmol/L (ref 98–111)
Creatinine, Ser: 1.02 mg/dL (ref 0.61–1.24)
GFR, Estimated: >60 mL/min (ref >60)
Glucose, Bld: 91 mg/dL (ref 70–99)
Potassium: 4.6 mmol/L (ref 3.5–5.1)
Sodium: 140 mmol/L (ref 135–145)

## 2022-05-06 LAB — CBC
HCT: 45.1 % (ref 39.0–52.0)
Hemoglobin: 15.6 g/dL (ref 13.0–17.0)
MCH: 30.1 pg (ref 26.0–34.0)
MCHC: 34.6 g/dL (ref 30.0–36.0)
MCV: 86.9 fL (ref 80.0–100.0)
Platelets: 261 10*3/uL (ref 150–400)
RBC: 5.19 MIL/uL (ref 4.22–5.81)
RDW: 13.2 % (ref 11.5–15.5)
WBC: 8 10*3/uL (ref 4.0–10.5)
nRBC: 0 % (ref 0.0–0.2)

## 2022-05-10 MED ORDER — ORAL CARE MOUTH RINSE: 15.0000 mL | Freq: Once | OROMUCOSAL | Status: AC

## 2022-05-10 MED ORDER — LACTATED RINGERS IV SOLN: INTRAVENOUS | Status: DC

## 2022-05-10 MED ORDER — FAMOTIDINE 20 MG PO TABS
20.0000 mg | ORAL_TABLET | Freq: Once | ORAL | Status: AC
  Administered 2022-05-11: 20 mg via ORAL

## 2022-05-10 MED ORDER — CEFAZOLIN SODIUM-DEXTROSE 2-4 GM/100ML-% IV SOLN
2.0000 g | INTRAVENOUS | Status: AC
  Administered 2022-05-11: 2 g via INTRAVENOUS

## 2022-05-10 MED ORDER — CHLORHEXIDINE GLUCONATE 0.12 % MT SOLN
15.0000 mL | Freq: Once | OROMUCOSAL | Status: AC
  Administered 2022-05-11: 15 mL via OROMUCOSAL

## 2022-05-11 ENCOUNTER — Ambulatory Visit
Admission: RE | Admit: 2022-05-11 | Discharge: 2022-05-11 | Disposition: A | Discharge status: Home / Self Care | Payer: 59 | Attending: General Surgery | Admitting: General Surgery

## 2022-05-11 ENCOUNTER — Ambulatory Visit: Payer: 59 | Admitting: Urgent Care

## 2022-05-11 ENCOUNTER — Other Ambulatory Visit: Payer: Self-pay

## 2022-05-11 ENCOUNTER — Encounter
Admission: RE | Disposition: A | Discharge status: Home / Self Care | Payer: Self-pay | Source: Home / Self Care | Attending: General Surgery

## 2022-05-11 ENCOUNTER — Encounter: Payer: Self-pay | Admitting: General Surgery

## 2022-05-11 DIAGNOSIS — Z85038 Personal history of other malignant neoplasm of large intestine: Secondary | ICD-10-CM | POA: Insufficient documentation

## 2022-05-11 DIAGNOSIS — M069 Rheumatoid arthritis, unspecified: Secondary | ICD-10-CM | POA: Diagnosis not present

## 2022-05-11 DIAGNOSIS — I341 Nonrheumatic mitral (valve) prolapse: Secondary | ICD-10-CM | POA: Diagnosis not present

## 2022-05-11 DIAGNOSIS — J449 Chronic obstructive pulmonary disease, unspecified: Secondary | ICD-10-CM | POA: Insufficient documentation

## 2022-05-11 DIAGNOSIS — K4090 Unilateral inguinal hernia, without obstruction or gangrene, not specified as recurrent: Secondary | ICD-10-CM | POA: Diagnosis not present

## 2022-05-11 DIAGNOSIS — Z87891 Personal history of nicotine dependence: Secondary | ICD-10-CM | POA: Insufficient documentation

## 2022-05-11 DIAGNOSIS — Z9221 Personal history of antineoplastic chemotherapy: Secondary | ICD-10-CM | POA: Insufficient documentation

## 2022-05-11 DIAGNOSIS — K4020 Bilateral inguinal hernia, without obstruction or gangrene, not specified as recurrent: Secondary | ICD-10-CM | POA: Diagnosis not present

## 2022-05-11 HISTORY — PX: XI ROBOTIC ASSISTED INGUINAL HERNIA REPAIR WITH MESH: SHX6706

## 2022-05-11 SURGERY — REPAIR, HERNIA, INGUINAL, ROBOT-ASSISTED, LAPAROSCOPIC, USING MESH
Anesthesia: General | Site: Inguinal | Laterality: Left

## 2022-05-11 MED ORDER — PROPOFOL 10 MG/ML IV BOLUS
INTRAVENOUS | Status: DC | PRN
  Administered 2022-05-11: 150 mg via INTRAVENOUS

## 2022-05-11 MED ORDER — ONDANSETRON HCL 4 MG/2ML IJ SOLN
INTRAMUSCULAR | Status: AC
  Administered 2022-05-11: 4 mg via INTRAVENOUS
  Filled 2022-05-11: qty 2

## 2022-05-11 MED ORDER — ROCURONIUM BROMIDE 10 MG/ML (PF) SYRINGE
PREFILLED_SYRINGE | INTRAVENOUS | Status: AC
  Filled 2022-05-11: qty 10

## 2022-05-11 MED ORDER — ONDANSETRON HCL 4 MG/2ML IJ SOLN
INTRAMUSCULAR | Status: DC | PRN
  Administered 2022-05-11: 4 mg via INTRAVENOUS

## 2022-05-11 MED ORDER — ONDANSETRON HCL 4 MG/2ML IJ SOLN: 4.0000 mg | Freq: Once | INTRAMUSCULAR | Status: AC | PRN

## 2022-05-11 MED ORDER — LIDOCAINE HCL (CARDIAC) PF 100 MG/5ML IV SOSY
PREFILLED_SYRINGE | INTRAVENOUS | Status: DC | PRN
  Administered 2022-05-11: 100 mg via INTRAVENOUS

## 2022-05-11 MED ORDER — CHLORHEXIDINE GLUCONATE 0.12 % MT SOLN
OROMUCOSAL | Status: AC
  Filled 2022-05-11: qty 15

## 2022-05-11 MED ORDER — SUGAMMADEX SODIUM 200 MG/2ML IV SOLN
INTRAVENOUS | Status: DC | PRN
  Administered 2022-05-11: 350 mg via INTRAVENOUS

## 2022-05-11 MED ORDER — OXYCODONE HCL 5 MG PO TABS: 5.0000 mg | ORAL_TABLET | Freq: Once | ORAL | Status: DC | PRN

## 2022-05-11 MED ORDER — DEXAMETHASONE SODIUM PHOSPHATE 10 MG/ML IJ SOLN
INTRAMUSCULAR | Status: DC | PRN
  Administered 2022-05-11: 10 mg via INTRAVENOUS

## 2022-05-11 MED ORDER — FENTANYL CITRATE (PF) 100 MCG/2ML IJ SOLN
INTRAMUSCULAR | Status: AC
  Administered 2022-05-11: 25 ug via INTRAVENOUS
  Filled 2022-05-11: qty 2

## 2022-05-11 MED ORDER — ACETAMINOPHEN 10 MG/ML IV SOLN
INTRAVENOUS | Status: DC | PRN
  Administered 2022-05-11: 1000 mg via INTRAVENOUS

## 2022-05-11 MED ORDER — ROCURONIUM BROMIDE 100 MG/10ML IV SOLN
INTRAVENOUS | Status: DC | PRN
  Administered 2022-05-11: 60 mg via INTRAVENOUS
  Administered 2022-05-11: 20 mg via INTRAVENOUS

## 2022-05-11 MED ORDER — MIDAZOLAM HCL 2 MG/2ML IJ SOLN
INTRAMUSCULAR | Status: AC
  Filled 2022-05-11: qty 2

## 2022-05-11 MED ORDER — TRAMADOL HCL 50 MG PO TABS: 50.0000 mg | ORAL_TABLET | Freq: Four times a day (QID) | ORAL | 0 refills | Status: DC | PRN

## 2022-05-11 MED ORDER — DEXAMETHASONE SODIUM PHOSPHATE 10 MG/ML IJ SOLN
INTRAMUSCULAR | Status: AC
  Filled 2022-05-11: qty 1

## 2022-05-11 MED ORDER — KETOROLAC TROMETHAMINE 30 MG/ML IJ SOLN
INTRAMUSCULAR | Status: AC
  Filled 2022-05-11: qty 1

## 2022-05-11 MED ORDER — ACETAMINOPHEN 10 MG/ML IV SOLN: 1000.0000 mg | Freq: Once | INTRAVENOUS | Status: DC | PRN

## 2022-05-11 MED ORDER — ACETAMINOPHEN 10 MG/ML IV SOLN
INTRAVENOUS | Status: AC
  Filled 2022-05-11: qty 100

## 2022-05-11 MED ORDER — MIDAZOLAM HCL 2 MG/2ML IJ SOLN
INTRAMUSCULAR | Status: DC | PRN
  Administered 2022-05-11: 2 mg via INTRAVENOUS

## 2022-05-11 MED ORDER — FENTANYL CITRATE (PF) 100 MCG/2ML IJ SOLN
INTRAMUSCULAR | Status: AC
  Filled 2022-05-11: qty 2

## 2022-05-11 MED ORDER — BUPIVACAINE-EPINEPHRINE 0.25% -1:200000 IJ SOLN
INTRAMUSCULAR | Status: DC | PRN
  Administered 2022-05-11: 14 mL

## 2022-05-11 MED ORDER — LIDOCAINE HCL (PF) 2 % IJ SOLN
INTRAMUSCULAR | Status: AC
  Filled 2022-05-11: qty 5

## 2022-05-11 MED ORDER — OXYCODONE HCL 5 MG/5ML PO SOLN: 5.0000 mg | Freq: Once | ORAL | Status: DC | PRN

## 2022-05-11 MED ORDER — FAMOTIDINE 20 MG PO TABS
ORAL_TABLET | ORAL | Status: AC
  Filled 2022-05-11: qty 1

## 2022-05-11 MED ORDER — KETOROLAC TROMETHAMINE 30 MG/ML IJ SOLN
INTRAMUSCULAR | Status: DC | PRN
  Administered 2022-05-11: 30 mg via INTRAVENOUS

## 2022-05-11 MED ORDER — ONDANSETRON HCL 4 MG/2ML IJ SOLN
INTRAMUSCULAR | Status: AC
  Filled 2022-05-11: qty 2

## 2022-05-11 MED ORDER — PROPOFOL 10 MG/ML IV BOLUS
INTRAVENOUS | Status: AC
  Filled 2022-05-11: qty 20

## 2022-05-11 MED ORDER — FENTANYL CITRATE (PF) 100 MCG/2ML IJ SOLN
INTRAMUSCULAR | Status: DC | PRN
  Administered 2022-05-11 (×2): 50 ug via INTRAVENOUS

## 2022-05-11 MED ORDER — BUPIVACAINE-EPINEPHRINE (PF) 0.25% -1:200000 IJ SOLN
INTRAMUSCULAR | Status: AC
  Filled 2022-05-11: qty 30

## 2022-05-11 MED ORDER — FENTANYL CITRATE (PF) 100 MCG/2ML IJ SOLN
25.0000 ug | INTRAMUSCULAR | Status: DC | PRN
  Administered 2022-05-11 (×2): 25 ug via INTRAVENOUS

## 2022-05-11 MED ORDER — CEFAZOLIN SODIUM-DEXTROSE 2-4 GM/100ML-% IV SOLN
INTRAVENOUS | Status: AC
  Filled 2022-05-11: qty 100

## 2022-05-11 MED ORDER — SUGAMMADEX SODIUM 500 MG/5ML IV SOLN
INTRAVENOUS | Status: AC
  Filled 2022-05-11: qty 5

## 2022-05-11 SURGICAL SUPPLY — 42 items
ADH SKN CLS APL DERMABOND .7 (GAUZE/BANDAGES/DRESSINGS) ×2
BLADE SURG SZ11 CARB STEEL (BLADE) ×2 IMPLANT
COVER TIP SHEARS 8 DVNC (MISCELLANEOUS) ×2 IMPLANT
COVER TIP SHEARS 8MM DA VINCI (MISCELLANEOUS) ×2
COVER WAND RF STERILE (DRAPES) ×2 IMPLANT
DERMABOND ADVANCED .7 DNX12 (GAUZE/BANDAGES/DRESSINGS) ×2 IMPLANT
DRAPE ARM DVNC X/XI (DISPOSABLE) ×6 IMPLANT
DRAPE COLUMN DVNC XI (DISPOSABLE) ×2 IMPLANT
DRAPE DA VINCI XI ARM (DISPOSABLE) ×6
DRAPE DA VINCI XI COLUMN (DISPOSABLE) ×2
ELECT REM PT RETURN 9FT ADLT (ELECTROSURGICAL) ×2
ELECTRODE REM PT RTRN 9FT ADLT (ELECTROSURGICAL) ×2 IMPLANT
GLOVE BIO SURGEON STRL SZ 6.5 (GLOVE) ×5 IMPLANT
GLOVE BIOGEL PI IND STRL 6.5 (GLOVE) ×5 IMPLANT
GLOVE INDICATOR 8.5 STRL (GLOVE) ×1 IMPLANT
GLOVE SURG SYN 8.5  E (GLOVE) ×2
GLOVE SURG SYN 8.5 E (GLOVE) ×2 IMPLANT
GLOVE SURG SYN 8.5 PF PI (GLOVE) IMPLANT
GOWN STRL REUS W/ TWL LRG LVL3 (GOWN DISPOSABLE) ×7 IMPLANT
GOWN STRL REUS W/TWL LRG LVL3 (GOWN DISPOSABLE) ×8
IV CATH ANGIO 12GX3 LT BLUE (NEEDLE) ×1 IMPLANT
KIT PINK PAD W/HEAD ARE REST (MISCELLANEOUS) ×2
KIT PINK PAD W/HEAD ARM REST (MISCELLANEOUS) ×2 IMPLANT
LABEL OR SOLS (LABEL) ×1 IMPLANT
MESH 3DMAX MID 5X7 LT XLRG (Mesh General) ×1 IMPLANT
NDL INSUFFLATION 14GA 120MM (NEEDLE) ×1 IMPLANT
NEEDLE HYPO 22GX1.5 SAFETY (NEEDLE) ×2 IMPLANT
NEEDLE INSUFFLATION 14GA 120MM (NEEDLE) ×2 IMPLANT
OBTURATOR OPTICAL STANDARD 8MM (TROCAR) ×2
OBTURATOR OPTICAL STND 8 DVNC (TROCAR) ×2
OBTURATOR OPTICALSTD 8 DVNC (TROCAR) ×2 IMPLANT
PACK LAP CHOLECYSTECTOMY (MISCELLANEOUS) ×2 IMPLANT
SEAL CANN UNIV 5-8 DVNC XI (MISCELLANEOUS) ×6 IMPLANT
SEAL XI 5MM-8MM UNIVERSAL (MISCELLANEOUS) ×6
SET TUBE SMOKE EVAC HIGH FLOW (TUBING) ×2 IMPLANT
SOLUTION ELECTROLUBE (MISCELLANEOUS) ×2 IMPLANT
SUT MNCRL 4-0 (SUTURE) ×2
SUT MNCRL 4-0 27XMFL (SUTURE) ×2
SUT VIC AB 2-0 SH 27 (SUTURE) ×2
SUT VIC AB 2-0 SH 27XBRD (SUTURE) ×2 IMPLANT
SUT VLOC 90 S/L VL9 GS22 (SUTURE) ×2 IMPLANT
SUTURE MNCRL 4-0 27XMF (SUTURE) ×2 IMPLANT

## 2022-05-11 NOTE — Anesthesia Preprocedure Evaluation (Signed)
Anesthesia Evaluation  Patient identified by MRN, date of birth, ID band Patient awake    Reviewed: Allergy & Precautions, H&P , NPO status , Patient's Chart, lab work & pertinent test results, reviewed documented beta blocker date and time   History of Anesthesia Complications Negative for: history of anesthetic complications  Airway Mallampati: III  TM Distance: >3 FB Neck ROM: full    Dental no notable dental hx. 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 Left orbital area is red at baseline negative psych ROS   GI/Hepatic negative GI ROS, Neg liver ROS, neg GERD  ,  Endo/Other  negative endocrine ROSneg diabetes  Renal/GU negative Renal ROS     Musculoskeletal   Abdominal   Peds  Hematology negative hematology ROS (+)   Anesthesia Other Findings Past Medical History: No date: Abnormal heart rhythm No date: Anginal pain (HCC) No date: Colon cancer ([place])     Comment:  sigmoid No date: Dysrhythmia No date: Headache No date: Hemorrhoid No date: History of chemotherapy     Comment:  FOLFOX No date: Lung nodule     Comment:  RIGHT/ NO PROGRESSION No date: Mitral valve prolapse     Comment:  Pt reports Dr. Rockey Situ told him NO MVP No date: Rheumatoid arthritis ([place])  Reproductive/Obstetrics negative OB ROS                             Anesthesia Physical  Anesthesia Plan  ASA: 2  Anesthesia Plan: General   Post-op Pain Management: Ofirmev IV (intra-op)*   Induction: Intravenous  PONV Risk Score  and Plan: 4 or greater and Ondansetron, Dexamethasone and Midazolam  Airway Management Planned: Oral ETT  Additional Equipment: None  Intra-op Plan:   Post-operative Plan: Extubation in OR  Informed Consent: I have reviewed the patients History and Physical, chart, labs and discussed the procedure including the risks, benefits and alternatives for the proposed anesthesia with the patient or authorized representative who has indicated his/her understanding and acceptance.     Dental advisory given  Plan Discussed with: CRNA  Anesthesia Plan Comments: (Discussed risks of anesthesia with patient, including PONV, sore throat, lip/dental/eye damage. Rare risks discussed as well, such as cardiorespiratory and neurological sequelae, and allergic reactions. Discussed the role of CRNA in patient's perioperative care. Patient understands. Patient "prefers" not to receive blood, would rather receive albumin. However he said if it was a matter of life and death, he would be OK with blood products to save his life  Will avoid N2O and anticholinergics due to glaucoma issues. Told about increased risk of eye/ocular sequelae after anesthesia given pre existing glaucoma.)        Anesthesia Quick Evaluation

## 2022-05-11 NOTE — Op Note (Addendum)
Preoperative diagnosis: Left inguinal hernia.   Postoperative diagnosis: Left inguinal hernia.  Procedure: Robotic assisted Laparoscopic Transabdominal preperitoneal laparoscopic (TAPP) repair of left inguinal hernia.                     Robotic assisted laparoscopic lysis of adhesions.   Anesthesia: GETA  Surgeon: Dr. Windell Moment  Wound Classification: Clean  Indications:  Patient is a 61 y.o. male developed a symptomatic left inguinal hernia. Repair was indicated.  Findings: 1. Left indirect Inguinal hernia identified 2. Vas deferens and cord structures identified and preserved 3. Bard Extra Large 3D Max MID Anatomical mesh used for repair 4. Adequate hemostasis.   Extensive adhesions:   Left indirect inguinal hernia. No right inguinal hernia   Post left inguinal hernia repair   Description of procedure: The patient was taken to the operating room and the correct side of surgery was verified. The patient was placed supine with arms tucked at the sides. After obtaining adequate anesthesia, the patient's abdomen was prepped and draped in standard sterile fashion. The patient was placed in the Trendelenburg position. A time-out was completed verifying correct patient, procedure, site, positioning, and implant(s) and/or special equipment prior to beginning this procedure. A Veress needle was placed at the umbilicus and pneumoperitoneum created with insufflation of carbon dioxide to 15 mmHg. After the Veress needle was removed, an 8-mm trocar was placed on epigastric area and the 30 angled laparoscope inserted. Two 8-mm trocars were then placed lateral to the rectus sheath under direct visualization. Upon inspection of the abdomen, abundant amount of adhesions from previous cholecystectomy was identified. An extensive, difficult and time consuming lysis of adhesion was needed to be done to be able to expose the groin area to start the surgery.  Both inguinal regions were inspected. No  hernia on right groin identified. The median umbilical ligament, medial umbilical ligament, and lateral umbilical fold were identified.  The robotic arms were docked. The robotic scope was inserted and the pelvic area anatomy targeted.  The peritoneum was incised with scissors along a line 6 cm above the superior edge of the hernia defect, extending from the median umbilical ligament to the anterior superior iliac spine. The peritoneal flap was mobilized inferiorly using blunt and sharp dissection. The inferior epigastric vessels were exposed and the pubic symphysis was identified. Cooper's ligament was dissected to its junction with the iliac vein. The dissection was continued inferiorly to the iliopubic tract, with care taken to avoid injury to the femoral branch of the genitofemoral nerve and the lateral femoral cutaneous nerve. The cord structures were parietalized. The hernia was identified and reduced by gentle traction.  The hernia sac was noted mobilized from the cord structures and reduced into the peritoneal cavity.  An extra large piece of mesh was rolled longitudinally into a compact cylinder and passed through a trocar. The cylinder was placed along the inferior aspect of the working space and unrolled into place to completely cover the direct, indirect, and femoral spaces. The mesh was secured into place superiorly to the anterior abdominal wall and inferiorly and medially to Cooper's ligament with absorbable sutures. Care was taken to avoid the inferolateral triangles containing the iliac vessels and genital nerves. The peritoneal flap was closed over the mesh and secured with suture in similar positions of safety. After ensuring adequate hemostasis, the trocars were removed and the pneumoperitoneum allowed to escape. The trocar incisions were closed using monocryl and skin adhesive dressings applied.  The patient  tolerated the procedure well and was taken to the postanesthesia care unit in stable  condition.   Specimen: None  Complications: None  Estimated Blood Loss: 5 mL

## 2022-05-11 NOTE — Anesthesia Procedure Notes (Signed)
Procedure Name: Intubation Date/Time: 05/11/2022 8:40 AM  Performed by: Cammie Sickle, CRNAPre-anesthesia Checklist: Patient identified, Patient being monitored, Timeout performed, Emergency Drugs available and Suction available Patient Re-evaluated:Patient Re-evaluated prior to induction Oxygen Delivery Method: Circle system utilized Preoxygenation: Pre-oxygenation with 100% oxygen Induction Type: IV induction Ventilation: Mask ventilation without difficulty Laryngoscope Size: 3 and McGraph Grade View: Grade I Tube type: Oral Tube size: 7.0 mm Number of attempts: 1 Airway Equipment and Method: Stylet Placement Confirmation: ETT inserted through vocal cords under direct vision, positive ETCO2 and breath sounds checked- equal and bilateral Secured at: 22 cm Tube secured with: Tape Dental Injury: Teeth and Oropharynx as per pre-operative assessment

## 2022-05-11 NOTE — Discharge Instructions (Addendum)
?  Diet: Resume home heart healthy regular diet.  ? ?Activity: No heavy lifting >20 pounds (children, pets, laundry, garbage) or strenuous activity until follow-up, but light activity and walking are encouraged. Do not drive or drink alcohol if taking narcotic pain medications. ? ?Wound care: May shower with soapy water and pat dry (do not rub incisions), but no baths or submerging incision underwater until follow-up. (no swimming)  ? ?Medications: Resume all home medications. For mild to moderate pain: acetaminophen (Tylenol) or ibuprofen (if no kidney disease). Combining Tylenol with alcohol can substantially increase your risk of causing liver disease. Narcotic pain medications, if prescribed, can be used for severe pain, though may cause nausea, constipation, and drowsiness. If you do not need the narcotic pain medication, you do not need to fill the prescription. ? ?Call office ([phone]) at any time if any questions, worsening pain, fevers/chills, bleeding, drainage from incision site, or other concerns. ? ? ?AMBULATORY SURGERY  ?DISCHARGE INSTRUCTIONS ? ? ?The drugs that you were given will stay in your system until tomorrow so for the next 24 hours you should not: ? ?Drive an automobile ?Make any legal decisions ?Drink any alcoholic beverage ? ? ?You may resume regular meals tomorrow.  Today it is better to start with liquids and gradually work up to solid foods. ? ?You may eat anything you prefer, but it is better to start with liquids, then soup and crackers, and gradually work up to solid foods. ? ? ?Please notify your doctor immediately if you have any unusual bleeding, trouble breathing, redness and pain at the surgery site, drainage, fever, or pain not relieved by medication. ? ? ? ?Additional Instructions: ? ? ? ? ? ? ? ?Please contact your physician with any problems or Same Day Surgery at [phone], Monday through Friday 6 am to 4 pm, or [place] [facility] number at [phone].  ?

## 2022-05-11 NOTE — Interval H&P Note (Signed)
History and Physical Interval Note:  05/11/2022 7:02 AM  Benjamin Clarke  has presented today for surgery, with the diagnosis of K40.20 bil inguinal hernia w/o obstruction or gangrene, recurrence not specified.  The various methods of treatment have been discussed with the patient and family. After consideration of risks, benefits and other options for treatment, the patient has consented to  Procedure(s): XI ROBOTIC ASSISTED BILATERAL INGUINAL HERNIA (Bilateral) as a surgical intervention.  The patient's history has been reviewed, patient examined, no change in status, stable for surgery.  I have reviewed the patient's chart and labs.  Questions were answered to the patient's satisfaction.     Herbert Pun

## 2022-05-11 NOTE — Transfer of Care (Signed)
Immediate Anesthesia Transfer of Care Note  Patient: Benjamin Clarke  Procedure(s) Performed: XI ROBOTIC ASSISTED INGUINAL HERNIA REPAIR WITH MESH (Left: Inguinal)  Patient Location: PACU  Anesthesia Type:General  Level of Consciousness: drowsy  Airway & Oxygen Therapy: Patient Spontanous Breathing and Patient connected to face mask oxygen  Post-op Assessment: Report given to RN and Post -op Vital signs reviewed and stable  Post vital signs: Reviewed and stable  Last Vitals:  Vitals Value Taken Time  BP 126/82 05/11/22 1100  Temp 36.4 C 05/11/22 1057  Pulse 58 05/11/22 1102  Resp 11 05/11/22 1102  SpO2 100 % 05/11/22 1102  Vitals shown include unvalidated device data.  Last Pain:  Vitals:   05/11/22 0629  TempSrc: Temporal  PainSc: 0-No pain         Complications: No notable events documented.

## 2022-05-12 ENCOUNTER — Encounter: Payer: Self-pay | Admitting: General Surgery

## 2022-05-12 NOTE — Anesthesia Postprocedure Evaluation (Signed)
Anesthesia Post Note  Patient: Benjamin Clarke  Procedure(s) Performed: XI ROBOTIC ASSISTED INGUINAL HERNIA REPAIR WITH MESH (Left: Inguinal)  Patient location during evaluation: PACU Anesthesia Type: General Level of consciousness: awake and alert Pain management: pain level controlled Vital Signs Assessment: post-procedure vital signs reviewed and stable Respiratory status: spontaneous breathing, nonlabored ventilation, respiratory function stable and patient connected to nasal cannula oxygen Cardiovascular status: blood pressure returned to baseline and stable Postop Assessment: no apparent nausea or vomiting Anesthetic complications: no   No notable events documented.   Last Vitals:  Vitals:   05/11/22 1200 05/11/22 1207  BP: 98/69 113/76  Pulse: 65 70  Resp: 12 16  Temp:  36.6 C  SpO2: 95% 98%    Last Pain:  Vitals:   05/12/22 0921  TempSrc:   PainSc: 3                  Arita Miss

## 2022-07-28 ENCOUNTER — Encounter: Payer: Self-pay | Admitting: Internal Medicine

## 2022-07-29 ENCOUNTER — Encounter
Admission: RE | Disposition: A | Discharge status: Home / Self Care | Payer: Self-pay | Source: Home / Self Care | Attending: Internal Medicine

## 2022-07-29 ENCOUNTER — Ambulatory Visit: Payer: 59 | Admitting: Anesthesiology

## 2022-07-29 ENCOUNTER — Ambulatory Visit
Admission: RE | Admit: 2022-07-29 | Discharge: 2022-07-29 | Disposition: A | Discharge status: Home / Self Care | Payer: 59 | Attending: Internal Medicine | Admitting: Internal Medicine

## 2022-07-29 DIAGNOSIS — Z1211 Encounter for screening for malignant neoplasm of colon: Secondary | ICD-10-CM | POA: Diagnosis not present

## 2022-07-29 DIAGNOSIS — Z923 Personal history of irradiation: Secondary | ICD-10-CM | POA: Insufficient documentation

## 2022-07-29 DIAGNOSIS — Z980 Intestinal bypass and anastomosis status: Secondary | ICD-10-CM | POA: Insufficient documentation

## 2022-07-29 DIAGNOSIS — Z85038 Personal history of other malignant neoplasm of large intestine: Secondary | ICD-10-CM | POA: Insufficient documentation

## 2022-07-29 DIAGNOSIS — Z9049 Acquired absence of other specified parts of digestive tract: Secondary | ICD-10-CM | POA: Diagnosis not present

## 2022-07-29 DIAGNOSIS — Z9221 Personal history of antineoplastic chemotherapy: Secondary | ICD-10-CM | POA: Diagnosis not present

## 2022-07-29 DIAGNOSIS — Z8572 Personal history of non-Hodgkin lymphomas: Secondary | ICD-10-CM | POA: Insufficient documentation

## 2022-07-29 DIAGNOSIS — I499 Cardiac arrhythmia, unspecified: Secondary | ICD-10-CM | POA: Diagnosis not present

## 2022-07-29 DIAGNOSIS — K641 Second degree hemorrhoids: Secondary | ICD-10-CM | POA: Insufficient documentation

## 2022-07-29 DIAGNOSIS — Z87891 Personal history of nicotine dependence: Secondary | ICD-10-CM | POA: Insufficient documentation

## 2022-07-29 DIAGNOSIS — Z8719 Personal history of other diseases of the digestive system: Secondary | ICD-10-CM | POA: Diagnosis not present

## 2022-07-29 DIAGNOSIS — Z86010 Personal history of colonic polyps: Secondary | ICD-10-CM | POA: Insufficient documentation

## 2022-07-29 DIAGNOSIS — K649 Unspecified hemorrhoids: Secondary | ICD-10-CM | POA: Diagnosis not present

## 2022-07-29 HISTORY — PX: COLONOSCOPY: SHX5424

## 2022-07-29 SURGERY — COLONOSCOPY
Anesthesia: General

## 2022-07-29 MED ORDER — SODIUM CHLORIDE 0.9 % IV SOLN
INTRAVENOUS | Status: DC
  Administered 2022-07-29: 20 mL/h via INTRAVENOUS

## 2022-07-29 MED ORDER — PROPOFOL 10 MG/ML IV BOLUS
INTRAVENOUS | Status: DC | PRN
  Administered 2022-07-29: 100 mg via INTRAVENOUS
  Administered 2022-07-29: 40 mg via INTRAVENOUS

## 2022-07-29 MED ORDER — LIDOCAINE HCL (CARDIAC) PF 100 MG/5ML IV SOSY
PREFILLED_SYRINGE | INTRAVENOUS | Status: DC | PRN
  Administered 2022-07-29: 50 mg via INTRAVENOUS

## 2022-07-29 NOTE — Transfer of Care (Signed)
Immediate Anesthesia Transfer of Care Note  Patient: KEY CEN  Procedure(s) Performed: COLONOSCOPY  Patient Location: Endoscopy Unit  Anesthesia Type:General  Level of Consciousness: drowsy  Airway & Oxygen Therapy: Patient Spontanous Breathing and Patient connected to nasal cannula oxygen  Post-op Assessment: Report given to RN, Post -op Vital signs reviewed and stable, and Patient moving all extremities  Post vital signs: Reviewed and stable  Last Vitals:  Vitals Value Taken Time  BP 94/74 07/29/22 1021  Temp 36.1 C 07/29/22 1021  Pulse 70 07/29/22 1021  Resp 13 07/29/22 1021  SpO2 98 % 07/29/22 1021    Last Pain:  Vitals:   07/29/22 1021  TempSrc: Temporal  PainSc: Asleep         Complications: No notable events documented.

## 2022-07-29 NOTE — Interval H&P Note (Signed)
History and Physical Interval Note:  07/29/2022 9:58 AM  Benjamin Clarke  has presented today for surgery, with the diagnosis of History of colon cancer (Z85.038).  The various methods of treatment have been discussed with the patient and family. After consideration of risks, benefits and other options for treatment, the patient has consented to  Procedure(s): COLONOSCOPY (N/A) as a surgical intervention.  The patient's history has been reviewed, patient examined, no change in status, stable for surgery.  I have reviewed the patient's chart and labs.  Questions were answered to the patient's satisfaction.     [place], [place]

## 2022-07-29 NOTE — H&P (Signed)
Outpatient short stay form Pre-procedure 07/29/2022 9:56 AM Benjamin Clarke K. [name], M.D.  Primary Physician: Nobie Putnam, M.D.  Reason for visit:  Personal history of colon cancer  History of present illness:  Benjamin Clarke is a 62 yo male with history of rheumatoid arthritis, mitral valve prolapse, COPD, colon cancer stage III T3N1 s/p Left hemicolectomy (05/16/2008)and CTX finished Aug 2010. 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 He is here to schedule a surveillance colonoscopy. He has no upper or lower GI complaints. -07/02/2015 for colonoscopy revealed 2 diminutive polyps, internal hemorrhoids. Pathology showed colonic mucosa with focal glandular hyperplasia and benign lymphoid aggregate. Negative dysplasia or malignancy.  -LAST1/21/2020 colonoscopy: Indication: History of colon cancer. 2 polyps, pathology: Colonic mucosa with focal glandular hyperplasia. No adenomatous glands identified. Benign hyperplastic polyp. Repeat in 2 years.     Current Facility-Administered Medications:    0.9 %  sodium chloride infusion, , Intravenous, Continuous, Dent, Benay Pike, MD, Last Rate: 20 mL/hr at 07/29/22 0909, 20 mL/hr at 07/29/22 0909  Medications Prior to Admission  Medication Sig Dispense Refill Last Dose   Ascorbic Acid (VITAMIN C) 1000 MG tablet Take 1,000 mg by mouth daily.   Past Week   aspirin EC 81 MG tablet Take 81 mg by mouth daily.   Past Week   brimonidine-timolol (COMBIGAN) 0.2-0.5 % ophthalmic solution Place 1 drop into both eyes every 12 (twelve) hours.   Past Week   Cholecalciferol (VITAMIN D) 2000 units CAPS Take 2,000 Units by mouth daily.   Past Week   Difluprednate 0.05 % EMUL Place 1 drop into the left eye daily.   07/28/2022   Multiple Vitamins-Minerals (DAILY MULTI) TABS Take 1 tablet by mouth daily.   Past Week   polyethylene glycol powder (GLYCOLAX/MIRALAX) 17 GM/SCOOP powder Take 1 Container by mouth as  needed for mild constipation.   Past Week   tetrahydrozoline-zinc (VISINE-AC) 0.05-0.25 % ophthalmic solution Place 2 drops into both eyes 3 (three) times daily as needed.   Past Week   traMADol (ULTRAM) 50 MG tablet Take 1 tablet (50 mg total) by mouth every 6 (six) hours as needed. 20 tablet 0 Past Week     Allergies  Allergen Reactions   Latex Dermatitis    bandages   Lumigan [Bimatoprost]     REDNESS   Thorazine [Chlorpromazine] Rash     Past Medical History:  Diagnosis Date   Abnormal heart rhythm    Anginal pain (HCC)    Colon cancer (HCC)    sigmoid   Dysrhythmia    Headache    Hemorrhoid    History of chemotherapy    FOLFOX   Lung nodule    RIGHT/ NO PROGRESSION   Mitral valve prolapse    Pt reports Dr. Rockey Situ told him NO MVP   Rheumatoid arthritis ([place])     Review of systems:  Otherwise negative.    Physical Exam  Gen: Alert, oriented. Appears stated age.  HEENT: [place]/AT. PERRLA. Lungs: CTA, no wheezes. CV: RR nl S1, S2. Abd: soft, benign, no masses. BS+ Ext: No edema. Pulses 2+    Planned procedures: Proceed with colonoscopy. The patient understands the nature of the planned procedure, indications, risks, alternatives and potential complications including but not limited to bleeding, infection, perforation, damage to internal organs and possible oversedation/side effects from anesthesia. The patient agrees and gives consent to proceed.  Please refer to procedure notes for findings, recommendations and patient disposition/instructions.  Benjamin Clarke K. [name], M.D. Gastroenterology 07/29/2022  9:56 AM

## 2022-07-29 NOTE — Op Note (Signed)
[facility] Patient Name: Benjamin Clarke Procedure Date: 07/29/2022 9:57 AM MRN: [id number] Account #: [id number] Date of Birth: 03/01/1961 Admit Type: Outpatient Age: 62 Room: [facility] ENDO ROOM 2 Gender: Male Note Status: Finalized Instrument Name: Jasper Riling [id number] Procedure:             Colonoscopy Indications:           High risk colon cancer surveillance: Personal history                         of colon cancer Providers:             Benay Pike. [name] MD, MD Referring MD:          Olin Hauser (Referring MD) Medicines:             Propofol per Anesthesia Complications:         No immediate complications. Procedure:             Pre-Anesthesia Assessment:                        - The risks and benefits of the procedure and the                         sedation options and risks were discussed with the                         patient. All questions were answered and informed                         consent was obtained.                        - Patient identification and proposed procedure were                         verified prior to the procedure by the nurse. The                         procedure was verified in the procedure room.                        - ASA Grade Assessment: III - A patient with severe                         systemic disease.                        - After reviewing the risks and benefits, the patient                         was deemed in satisfactory condition to undergo the                         procedure.                        After obtaining informed consent, the colonoscope was                         passed under direct  vision. Throughout the procedure,                         the patient's blood pressure, pulse, and oxygen                         saturations were monitored continuously. The                         Colonoscope was introduced through the anus and                         advanced to the  the cecum, identified by appendiceal                         orifice and ileocecal valve. The colonoscopy was                         performed without difficulty. The patient tolerated                         the procedure well. The quality of the bowel                         preparation was good except the sigmoid colon was                         fair. The ileocecal valve, appendiceal orifice, and                         rectum were photographed. Findings:      The perianal and digital rectal examinations were normal. Pertinent       negatives include normal sphincter tone and no palpable rectal lesions.      Non-bleeding internal hemorrhoids were found during retroflexion. The       hemorrhoids were Grade II (internal hemorrhoids that prolapse but reduce       spontaneously).      There was evidence of a prior end-to-side colo-colonic anastomosis in       the recto-sigmoid colon. This was patent and was characterized by       healthy appearing mucosa. The anastomosis was traversed. Estimated blood       loss: none.      The colon (entire examined portion) appeared normal. Impression:            - Non-bleeding internal hemorrhoids.                        - Patent end-to-side colo-colonic anastomosis,                         characterized by healthy appearing mucosa.                        - The entire examined colon is normal.                        - No specimens collected. Recommendation:        - Patient has a contact number available for  emergencies. The signs and symptoms of potential                         delayed complications were discussed with the patient.                         Return to normal activities tomorrow. Written                         discharge instructions were provided to the patient.                        - Resume previous diet.                        - Continue present medications.                        - Repeat colonoscopy in 3  years for surveillance.                        - Return to GI office PRN.                        - The findings and recommendations were discussed with                         the patient. Procedure Code(s):     --- Professional ---                        H8299, Colorectal cancer screening; colonoscopy on                         individual at high risk Diagnosis Code(s):     --- Professional ---                        K64.1, Second degree hemorrhoids                        Z98.0, Intestinal bypass and anastomosis status                        Z85.038, Personal history of other malignant neoplasm                         of large intestine CPT copyright 2022 [employer]. All rights reserved. The codes documented in this report are preliminary and upon coder review may  be revised to meet current compliance requirements. Efrain Sella MD, MD 07/29/2022 10:22:31 AM This report has been signed electronically. Number of Addenda: 0 Note Initiated On: 07/29/2022 9:57 AM Scope Withdrawal Time: 0 hours 2 minutes 56 seconds  Total Procedure Duration: 0 hours 7 minutes 0 seconds  Estimated Blood Loss:  Estimated blood loss: none.      [facility]

## 2022-07-29 NOTE — Anesthesia Postprocedure Evaluation (Signed)
Anesthesia Post Note  Patient: Benjamin Clarke  Procedure(s) Performed: COLONOSCOPY  Patient location during evaluation: PACU Anesthesia Type: General Level of consciousness: awake Pain management: pain level controlled Vital Signs Assessment: post-procedure vital signs reviewed and stable Respiratory status: spontaneous breathing and nonlabored ventilation Cardiovascular status: stable Anesthetic complications: no  No notable events documented.   Last Vitals:  Vitals:   07/29/22 1031 07/29/22 1041  BP: 107/72 116/83  Pulse: 64 62  Resp: 15 16  Temp:    SpO2: 98% 98%    Last Pain:  Vitals:   07/29/22 1041  TempSrc:   PainSc: 0-No pain                 VAN STAVEREN,Lovelle Deitrick

## 2022-07-29 NOTE — Anesthesia Preprocedure Evaluation (Signed)
Anesthesia Evaluation  Patient identified by MRN, date of birth, ID band Patient awake    Reviewed: Allergy & Precautions, NPO status , Patient's Chart, lab work & pertinent test results  Airway Mallampati: II  TM Distance: >3 FB Neck ROM: full    Dental  (+) Teeth Intact   Pulmonary neg pulmonary ROS, former smoker   Pulmonary exam normal  + decreased breath sounds      Cardiovascular Exercise Tolerance: Good negative cardio ROS Normal cardiovascular exam+ dysrhythmias  Rhythm:Regular Rate:Normal     Neuro/Psych  Headaches negative neurological ROS  negative psych ROS   GI/Hepatic negative GI ROS, Neg liver ROS,,,  Endo/Other  negative endocrine ROS    Renal/GU negative Renal ROS  negative genitourinary   Musculoskeletal   Abdominal Normal abdominal exam  (+)   Peds  Hematology negative hematology ROS (+)   Anesthesia Other Findings Past Medical History: No date: Abnormal heart rhythm No date: Anginal pain (HCC) No date: Colon cancer ([place])     Comment:  sigmoid No date: Dysrhythmia No date: Headache No date: Hemorrhoid No date: History of chemotherapy     Comment:  FOLFOX No date: Lung nodule     Comment:  RIGHT/ NO PROGRESSION No date: Mitral valve prolapse     Comment:  Pt reports Dr. Rockey Situ told him NO MVP No date: Rheumatoid arthritis [facility])  Past Surgical History: 10/12/2016: AQUEOUS SHUNT; Left     Comment:  Procedure: AQUEOUS SHUNT with scleral patch graft  Left;              Surgeon: Ronnell Freshwater, MD;  Location: [place];  Service: Ophthalmology;  Laterality: Left;              tube shunt 10/12/2016: BIOPSY; Left     Comment:  Procedure: BIOPSY (fresh & permanent specimens sent from              same location)- conjunctiva left eye;  Surgeon: Ronnell Freshwater, MD;  Location: [place];                Service: Ophthalmology;   Laterality: Left; 03/01/2018: CATARACT EXTRACTION W/PHACO; Left     Comment:  Procedure: CATARACT EXTRACTION PHACO AND INTRAOCULAR               LENS PLACEMENT (IOC);  Surgeon: Birder Robson, MD;                Location: [facility];  Service: Ophthalmology;  Laterality:              Left;  [place] 01:49.2 AP% 18.6 CDE 20.32 Fluid Pack Lot #               [id number] No date: COLON SURGERY 2013, 05/16/2008: COLONOSCOPY 06/04/08: ESOPHAGOGASTRODUODENOSCOPY No date: EYE SURGERY; Left     Comment:  TUMOR BIOPSY 2010: PORT-A-CATH REMOVAL 06/2008: PORTACATH PLACEMENT 05/11/2022: XI ROBOTIC ASSISTED INGUINAL HERNIA REPAIR WITH MESH; Left     Comment:  Procedure: XI ROBOTIC ASSISTED INGUINAL HERNIA REPAIR               WITH MESH;  Surgeon: Herbert Pun, MD;                Location: [facility];  Service: General;  Laterality: Left;  BMI  Body Mass Index: 23.02 kg/m      Reproductive/Obstetrics negative OB ROS                             Anesthesia Physical Anesthesia Plan  ASA: 2  Anesthesia Plan: General   Post-op Pain Management:    Induction: Intravenous  PONV Risk Score and Plan: Propofol infusion and TIVA  Airway Management Planned: Natural Airway  Additional Equipment:   Intra-op Plan:   Post-operative Plan:   Informed Consent: I have reviewed the patients History and Physical, chart, labs and discussed the procedure including the risks, benefits and alternatives for the proposed anesthesia with the patient or authorized representative who has indicated his/her understanding and acceptance.     Dental Advisory Given  Plan Discussed with: CRNA and Surgeon  Anesthesia Plan Comments:        Anesthesia Quick Evaluation

## 2022-07-30 ENCOUNTER — Encounter: Payer: Self-pay | Admitting: Internal Medicine

## 2022-08-03 ENCOUNTER — Ambulatory Visit: Payer: 59 | Admitting: Dermatology

## 2022-08-03 DIAGNOSIS — Z1283 Encounter for screening for malignant neoplasm of skin: Secondary | ICD-10-CM | POA: Diagnosis not present

## 2022-08-03 DIAGNOSIS — L821 Other seborrheic keratosis: Secondary | ICD-10-CM

## 2022-08-03 DIAGNOSIS — D229 Melanocytic nevi, unspecified: Secondary | ICD-10-CM | POA: Diagnosis not present

## 2022-08-03 DIAGNOSIS — L820 Inflamed seborrheic keratosis: Secondary | ICD-10-CM

## 2022-08-03 DIAGNOSIS — L814 Other melanin hyperpigmentation: Secondary | ICD-10-CM

## 2022-08-03 DIAGNOSIS — Z79899 Other long term (current) drug therapy: Secondary | ICD-10-CM

## 2022-08-03 DIAGNOSIS — L578 Other skin changes due to chronic exposure to nonionizing radiation: Secondary | ICD-10-CM

## 2022-08-03 DIAGNOSIS — B360 Pityriasis versicolor: Secondary | ICD-10-CM

## 2022-08-03 DIAGNOSIS — L570 Actinic keratosis: Secondary | ICD-10-CM | POA: Diagnosis not present

## 2022-08-03 DIAGNOSIS — D692 Other nonthrombocytopenic purpura: Secondary | ICD-10-CM

## 2022-08-03 MED ORDER — KETOCONAZOLE 2 % EX SHAM: MEDICATED_SHAMPOO | CUTANEOUS | 11 refills | Status: DC

## 2022-08-03 NOTE — Progress Notes (Unsigned)
New pt Visit   Subjective  Benjamin Clarke is a 62 y.o. male who presents for the following: New Patient (Initial Visit) (Tbse, denied personal or family history of skin cancer. Reports some itchy spots at back, spots at left lower leg, and rough patch at left temple area.). The patient presents for Total-Body Skin Exam (TBSE) for skin cancer screening and mole check.  The patient has spots, moles and lesions to be evaluated, some may be new or changing and the patient has concerns that these could be cancer.  The following portions of the chart were reviewed this encounter and updated as appropriate:  Tobacco  Allergies  Meds  Problems  Med Hx  Surg Hx  Fam Hx     Review of Systems: No other skin or systemic complaints except as noted in HPI or Assessment and Plan.  Objective  Well appearing patient in no apparent distress; mood and affect are within normal limits.  A full examination was performed including scalp, head, eyes, ears, nose, lips, neck, chest, axillae, abdomen, back, buttocks, bilateral upper extremities, bilateral lower extremities, hands, feet, fingers, toes, fingernails, and toenails. All findings within normal limits unless otherwise noted below.  left temple x 1 Erythematous thin papules/macules with gritty scale.   left scapula x 1 Erythematous stuck-on, waxy papule or plaque  back Multiple dyspigmented macules and patches with fine scale.    Assessment & Plan  Actinic keratosis left temple x 1 Actinic keratoses are precancerous spots that appear secondary to cumulative UV radiation exposure/sun exposure over time. They are chronic with expected duration over 1 year. A portion of actinic keratoses will progress to squamous cell carcinoma of the skin. It is not possible to reliably predict which spots will progress to skin cancer and so treatment is recommended to prevent development of skin cancer.  Recommend daily broad spectrum sunscreen SPF 30+ to  sun-exposed areas, reapply every 2 hours as needed.  Recommend staying in the shade or wearing long sleeves, sun glasses (UVA+UVB protection) and wide brim hats (4-inch brim around the entire circumference of the hat). Call for new or changing lesions.  Destruction of lesion - left temple x 1 Complexity: simple   Destruction method: cryotherapy   Informed consent: discussed and consent obtained   Timeout:  patient name, date of birth, surgical site, and procedure verified Lesion destroyed using liquid nitrogen: Yes   Region frozen until ice ball extended beyond lesion: Yes   Outcome: patient tolerated procedure well with no complications   Post-procedure details: wound care instructions given   Additional details:  Prior to procedure, discussed risks of blister formation, small wound, skin dyspigmentation, or rare scar following cryotherapy. Recommend Vaseline ointment to treated areas while healing.  Inflamed seborrheic keratosis left scapula x 1 Symptomatic, irritating, patient would like treated. Destruction of lesion - left scapula x 1 Complexity: simple   Destruction method: cryotherapy   Informed consent: discussed and consent obtained   Timeout:  patient name, date of birth, surgical site, and procedure verified Lesion destroyed using liquid nitrogen: Yes   Region frozen until ice ball extended beyond lesion: Yes   Outcome: patient tolerated procedure well with no complications   Post-procedure details: wound care instructions given   Additional details:  Prior to procedure, discussed risks of blister formation, small wound, skin dyspigmentation, or rare scar following cryotherapy. Recommend Vaseline ointment to treated areas while healing.  Tinea versicolor back Tinea versicolor is a chronic recurrent skin rash causing discolored  scaly spots most commonly seen on back, chest, and/or shoulders.  It is generally asymptomatic. The rash is due to overgrowth of a common type of  yeast present on everyone's skin and it is not contagious.  It tends to flare more in the summer due to increased sweating on trunk.  After rash is treated, the scaliness will resolve, but the discoloration will take longer to return to normal pigmentation. The periodic use of an OTC medicated soap/shampoo with zinc or selenium sulfide can be helpful to prevent yeast overgrowth and recurrence.  Start ketoconazole shampoo face scalp arms back and abdomen use bid / tid times weekly as body wash to affected areas. Let sit a few minutes before rinsing  Use for 2 months after 2 months can decrease to using once weekly  ketoconazole (NIZORAL) 2 % shampoo - back Use as body wash to affected areas from waist up 2 - 3 times weekly. Lather and let sit on skin for few minutes before rinsing off.  Lentigines - Scattered tan macules - Due to sun exposure - Benign-appearing, observe - Recommend daily broad spectrum sunscreen SPF 30+ to sun-exposed areas, reapply every 2 hours as needed. - Call for any changes  Seborrheic Keratoses - Stuck-on, waxy, tan-brown papules and/or plaques  - Benign-appearing - Discussed benign etiology and prognosis. - Observe - Call for any changes  Purpura - Chronic; persistent and recurrent.  Treatable, but not curable. - Violaceous macules and patches - Benign - Related to trauma, age, sun damage and/or use of blood thinners, chronic use of topical and/or oral steroids - Observe - Can use OTC arnica containing moisturizer such as Dermend Bruise Formula if desired - Call for worsening or other concerns  Melanocytic Nevi - Tan-brown and/or pink-flesh-colored symmetric macules and papules - Benign appearing on exam today - Observation - Call clinic for new or changing moles - Recommend daily use of broad spectrum spf 30+ sunscreen to sun-exposed areas.   Hemangiomas - Red papules - Discussed benign nature - Observe - Call for any changes  Actinic Damage -  Chronic condition, secondary to cumulative UV/sun exposure - diffuse scaly erythematous macules with underlying dyspigmentation - Recommend daily broad spectrum sunscreen SPF 30+ to sun-exposed areas, reapply every 2 hours as needed.  - Staying in the shade or wearing long sleeves, sun glasses (UVA+UVB protection) and wide brim hats (4-inch brim around the entire circumference of the hat) are also recommended for sun protection.  - Call for new or changing lesions.  Skin cancer screening performed today. Return for 1 - 2 tbse . IRuthell Rummage, CMA, am acting as scribe for Sarina Ser, MD. Documentation: I have reviewed the above documentation for accuracy and completeness, and I agree with the above.  Sarina Ser, MD

## 2022-08-03 NOTE — Patient Instructions (Addendum)
Tinea versicolor is a chronic recurrent skin rash causing discolored scaly spots most commonly seen on back, chest, and/or shoulders.  It is generally asymptomatic. The rash is due to overgrowth of a common type of yeast present on everyone's skin and it is not contagious.  It tends to flare more in the summer due to increased sweating on trunk.  After rash is treated, the scaliness will resolve, but the discoloration will take longer to return to normal pigmentation. The periodic use of an OTC medicated soap/shampoo with zinc or selenium sulfide can be helpful to prevent yeast overgrowth and recurrence.  Use for 2 months   Start ketoconazole shampoo use as a body wash to affected areas from waist up to  face, scalp, arms, back, and abdomen use 2 - 3 times weekly. Let sit a few minutes before rinsing  After 2 months can use once monthly.     Cryotherapy Aftercare  Wash gently with soap and water everyday.   Apply Vaseline and Band-Aid daily until healed.   Actinic keratoses are precancerous spots that appear secondary to cumulative UV radiation exposure/sun exposure over time. They are chronic with expected duration over 1 year. A portion of actinic keratoses will progress to squamous cell carcinoma of the skin. It is not possible to reliably predict which spots will progress to skin cancer and so treatment is recommended to prevent development of skin cancer.  Recommend daily broad spectrum sunscreen SPF 30+ to sun-exposed areas, reapply every 2 hours as needed.  Recommend staying in the shade or wearing long sleeves, sun glasses (UVA+UVB protection) and wide brim hats (4-inch brim around the entire circumference of the hat). Call for new or changing lesions.    Seborrheic Keratosis  What causes seborrheic keratoses? Seborrheic keratoses are harmless, common skin growths that first appear during adult life.  As time goes by, more growths appear.  Some people may develop a large number of  them.  Seborrheic keratoses appear on both covered and uncovered body parts.  They are not caused by sunlight.  The tendency to develop seborrheic keratoses can be inherited.  They vary in color from skin-colored to gray, brown, or even black.  They can be either smooth or have a rough, warty surface.   Seborrheic keratoses are superficial and look as if they were stuck on the skin.  Under the microscope this type of keratosis looks like layers upon layers of skin.  That is why at times the top layer may seem to fall off, but the rest of the growth remains and re-grows.    Treatment Seborrheic keratoses do not need to be treated, but can easily be removed in the office.  Seborrheic keratoses often cause symptoms when they rub on clothing or jewelry.  Lesions can be in the way of shaving.  If they become inflamed, they can cause itching, soreness, or burning.  Removal of a seborrheic keratosis can be accomplished by freezing, burning, or surgery. If any spot bleeds, scabs, or grows rapidly, please return to have it checked, as these can be an indication of a skin cancer.     Gentle Skin Care Guide  1. Bathe no more than once a day.  2. Avoid bathing in hot water  3. Use a mild soap like Dove, Vanicream, Cetaphil, CeraVe. Can use Lever 2000 or Cetaphil antibacterial soap  4. Use soap only where you need it. On most days, use it under your arms, between your legs, and on your feet.  Let the water rinse other areas unless visibly dirty.  5. When you get out of the bath/shower, use a towel to gently blot your skin dry, don't rub it.  6. While your skin is still a little damp, apply a moisturizing cream such as Vanicream, CeraVe, Cetaphil, Eucerin, Sarna lotion or plain Vaseline Jelly. For hands apply Neutrogena [place] Hand Cream or Excipial Hand Cream.  7. Reapply moisturizer any time you start to itch or feel dry.  8. Sometimes using free and clear laundry detergents can be helpful. Fabric  softener sheets should be avoided. Downy Free & Gentle liquid, or any liquid fabric softener that is free of dyes and perfumes, it acceptable to use  9. If your doctor has given you prescription creams you may apply moisturizers over them       Melanoma ABCDEs  Melanoma is the most dangerous type of skin cancer, and is the leading cause of death from skin disease.  You are more likely to develop melanoma if you: Have light-colored skin, light-colored eyes, or red or blond hair Spend a lot of time in the sun Tan regularly, either outdoors or in a tanning bed Have had blistering sunburns, especially during childhood Have a close family member who has had a melanoma Have atypical moles or large birthmarks  Early detection of melanoma is key since treatment is typically straightforward and cure rates are extremely high if we catch it early.   The first sign of melanoma is often a change in a mole or a new dark spot.  The ABCDE system is a way of remembering the signs of melanoma.  A for asymmetry:  The two halves do not match. B for border:  The edges of the growth are irregular. C for color:  A mixture of colors are present instead of an even brown color. D for diameter:  Melanomas are usually (but not always) greater than 29m - the size of a pencil eraser. E for evolution:  The spot keeps changing in size, shape, and color.  Please check your skin once per month between visits. You can use a small mirror in front and a large mirror behind you to keep an eye on the back side or your body.   If you see any new or changing lesions before your next follow-up, please call to schedule a visit.  Please continue daily skin protection including broad spectrum sunscreen SPF 30+ to sun-exposed areas, reapplying every 2 hours as needed when you're outdoors.   Staying in the shade or wearing long sleeves, sun glasses (UVA+UVB protection) and wide brim hats (4-inch brim around the entire  circumference of the hat) are also recommended for sun protection.    Due to recent changes in healthcare laws, you may see results of your pathology and/or laboratory studies on MyChart before the doctors have had a chance to review them. We understand that in some cases there may be results that are confusing or concerning to you. Please understand that not all results are received at the same time and often the doctors may need to interpret multiple results in order to provide you with the best plan of care or course of treatment. Therefore, we ask that you please give u[place]2 business days to thoroughly review all your results before contacting the office for clarification. Should we see a critical lab result, you will be contacted sooner.   If You Need Anything After Your Visit  If you have any questions or  concerns for your doctor, please call our main line at [phone] and press option 4 to reach your doctor's medical assistant. If no one answers, please leave a voicemail as directed and we will return your call as soon as possible. Messages left after 4 pm will be answered the following business day.   You may also send [place] a message via [place]. We typically respond to MyChart messages within 1-2 business days.  For prescription refills, please ask your pharmacy to contact our office. Our fax number is [phone].  If you have an urgent issue when the clinic is closed that cannot wait until the next business day, you can page your doctor at the number below.    Please note that while we do our best to be available for urgent issues outside of office hours, we are not available 24/7.   If you have an urgent issue and are unable to reach [place], you may choose to seek medical care at your doctor's office, retail clinic, urgent care center, or emergency room.  If you have a medical emergency, please immediately call 911 or go to the emergency department.  Pager Numbers  - Dr. Nehemiah Massed:  [phone]  - Dr. Laurence Ferrari: [phone]  - Dr. Nicole Kindred: [phone]  In the event of inclement weather, please call our main line at [phone] for an update on the status of any delays or closures.  Dermatology Medication Tips: Please keep the boxes that topical medications come in in order to help keep track of the instructions about where and how to use these. Pharmacies typically print the medication instructions only on the boxes and not directly on the medication tubes.   If your medication is too expensive, please contact our office at [phone] option 4 or send [place] a message through [place].   We are unable to tell what your co-pay for medications will be in advance as this is different depending on your insurance coverage. However, we may be able to find a substitute medication at lower cost or fill out paperwork to get insurance to cover a needed medication.   If a prior authorization is required to get your medication covered by your insurance company, please allow [place] 1-2 business days to complete this process.  Drug prices often vary depending on where the prescription is filled and some pharmacies may offer cheaper prices.  The website www.goodrx.com contains coupons for medications through different pharmacies. The prices here do not account for what the cost may be with help from insurance (it may be cheaper with your insurance), but the website can give you the price if you did not use any insurance.  - You can print the associated coupon and take it with your prescription to the pharmacy.  - You may also stop by our office during regular business hours and pick up a GoodRx coupon card.  - If you need your prescription sent electronically to a different pharmacy, notify our office through [facility] or by phone at [phone] option 4.     Si Usted Necesita Algo Despus de Su Visita  Tambin puede enviarnos un mensaje a travs de Pharmacist, community. Por lo general  respondemos a los mensajes de MyChart en el transcurso de 1 a 2 das hbiles.  Para renovar recetas, por favor pida a su farmacia que se ponga en contacto con nuestra oficina. Harland Dingwall de fax es [place] [phone].  Si tiene un asunto urgente cuando la clnica est cerrada y que no puede esperar  hasta el siguiente da hbil, puede llamar/localizar a su doctor(a) al nmero que aparece a continuacin.   Por favor, tenga en cuenta que aunque hacemos todo lo posible para estar disponibles para asuntos urgentes fuera del horario de [place], no estamos disponibles las 24 horas del da, los 7 das de la [place].   Si tiene un problema urgente y no puede comunicarse con nosotros, puede optar por buscar atencin mdica  en el consultorio de su doctor(a), en una clnica privada, en un centro de atencin urgente o en una sala de emergencias.  Si tiene Engineering geologist, por favor llame inmediatamente al 911 o vaya a la sala de emergencias.  Nmeros de bper  - Dr. Nehemiah Massed: [phone]  - Dra. Moye: [phone]  - Dra. Nicole Kindred: [phone]  En caso de inclemencias del [place], por favor llame a Johnsie Kindred principal al [phone] para una actualizacin sobre el [place] de cualquier retraso o cierre.  Consejos para la medicacin en dermatologa: Por favor, guarde las cajas en las que vienen los medicamentos de uso tpico para ayudarle a seguir las instrucciones sobre dnde y cmo usarlos. Las farmacias generalmente imprimen las instrucciones del medicamento slo en las cajas y no directamente en los tubos del [place].   Si su medicamento es muy caro, por favor, pngase en contacto con Zigmund Daniel llamando al [phone] y presione la opcin 4 o envenos un mensaje a travs de Pharmacist, community.   No podemos decirle cul ser su copago por los medicamentos por adelantado ya que esto es diferente dependiendo de la cobertura de su seguro. Sin embargo, es posible que podamos encontrar un  medicamento sustituto a Electrical engineer un formulario para que el seguro cubra el medicamento que se considera necesario.   Si se requiere una autorizacin previa para que su compaa de seguros [place] su medicamento, por favor permtanos de 1 a 2 das hbiles para completar este proceso.  Los precios de los medicamentos varan con frecuencia dependiendo del Environmental consultant de dnde se surte la receta y alguna farmacias pueden ofrecer precios ms baratos.  El sitio web www.goodrx.com tiene cupones para medicamentos de Airline pilot. Los precios aqu no tienen en cuenta lo que podra costar con la ayuda del seguro (puede ser ms barato con su seguro), pero el sitio web puede darle el precio si no utiliz Research scientist (physical sciences).  - Puede imprimir el cupn correspondiente y llevarlo con su receta a la farmacia.  - Tambin puede pasar por nuestra oficina durante el horario de atencin regular y Charity fundraiser una tarjeta de cupones de GoodRx.  - Si necesita que su receta se enve electrnicamente a una farmacia diferente, informe a nuestra oficina a travs de MyChart de [place] o por telfono llamando al [phone] y presione la opcin 4.

## 2022-08-04 ENCOUNTER — Encounter: Payer: Self-pay | Admitting: Dermatology

## 2022-08-05 NOTE — Addendum Note (Signed)
Addended by: Ralene Bathe on: 08/05/2022 06:23 PM   Modules accepted: Level of Service

## 2022-11-20 ENCOUNTER — Encounter: Payer: Self-pay | Admitting: Internal Medicine

## 2022-11-20 ENCOUNTER — Inpatient Hospital Stay: Payer: 59 | Attending: Internal Medicine

## 2022-11-20 ENCOUNTER — Inpatient Hospital Stay: Payer: 59 | Admitting: Internal Medicine

## 2022-11-20 DIAGNOSIS — H5789 Other specified disorders of eye and adnexa: Secondary | ICD-10-CM | POA: Diagnosis not present

## 2022-11-20 DIAGNOSIS — Z800 Family history of malignant neoplasm of digestive organs: Secondary | ICD-10-CM | POA: Insufficient documentation

## 2022-11-20 DIAGNOSIS — Z85038 Personal history of other malignant neoplasm of large intestine: Secondary | ICD-10-CM | POA: Diagnosis not present

## 2022-11-20 DIAGNOSIS — C884 Extranodal marginal zone B-cell lymphoma of mucosa-associated lymphoid tissue [MALT-lymphoma]: Secondary | ICD-10-CM

## 2022-11-20 DIAGNOSIS — Z87891 Personal history of nicotine dependence: Secondary | ICD-10-CM | POA: Diagnosis not present

## 2022-11-20 DIAGNOSIS — C187 Malignant neoplasm of sigmoid colon: Secondary | ICD-10-CM

## 2022-11-20 LAB — COMPREHENSIVE METABOLIC PANEL
ALT: 17 U/L (ref 0–44)
AST: 19 U/L (ref 15–41)
Albumin: 3.9 g/dL (ref 3.5–5.0)
Alkaline Phosphatase: 53 U/L (ref 38–126)
Anion gap: 6 (ref 5–15)
BUN: 14 mg/dL (ref 8–23)
CO2: 28 mmol/L (ref 22–32)
Calcium: 8.9 mg/dL (ref 8.9–10.3)
Chloride: 101 mmol/L (ref 98–111)
Creatinine, Ser: 0.95 mg/dL (ref 0.61–1.24)
GFR, Estimated: >60 mL/min (ref >60)
Glucose, Bld: 99 mg/dL (ref 70–99)
Potassium: 4.4 mmol/L (ref 3.5–5.1)
Sodium: 135 mmol/L (ref 135–145)
Total Bilirubin: 0.7 mg/dL (ref 0.3–1.2)
Total Protein: 7.2 g/dL (ref 6.5–8.1)

## 2022-11-20 LAB — CBC WITH DIFFERENTIAL/PLATELET
Abs Immature Granulocytes: 0.01 10*3/uL (ref 0.00–0.07)
Basophils Absolute: 0 10*3/uL (ref 0.0–0.1)
Basophils Relative: 0 %
Eosinophils Absolute: 0.2 10*3/uL (ref 0.0–0.5)
Eosinophils Relative: 3 %
HCT: 44.8 % (ref 39.0–52.0)
Hemoglobin: 15.2 g/dL (ref 13.0–17.0)
Immature Granulocytes: 0 %
Lymphocytes Relative: 23 %
Lymphs Abs: 1.5 10*3/uL (ref 0.7–4.0)
MCH: 29.6 pg (ref 26.0–34.0)
MCHC: 33.9 g/dL (ref 30.0–36.0)
MCV: 87.2 fL (ref 80.0–100.0)
Monocytes Absolute: 0.7 10*3/uL (ref 0.1–1.0)
Monocytes Relative: 11 %
Neutro Abs: 4.1 10*3/uL (ref 1.7–7.7)
Neutrophils Relative %: 63 %
Platelets: 253 10*3/uL (ref 150–400)
RBC: 5.14 MIL/uL (ref 4.22–5.81)
RDW: 13.3 % (ref 11.5–15.5)
WBC: 6.6 10*3/uL (ref 4.0–10.5)
nRBC: 0 % (ref 0.0–0.2)

## 2022-11-20 NOTE — Progress Notes (Signed)
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 On Mxt [summer 2017]  # Lung nodule [incidental on CT]; last Nov 2014- 3mm RML- STABLE [no further fu]  # ? 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  Ambulating independently.  A very pleasant 62 year old male patient with above history left orbital/ MALT lymphoma status post definitive radiation is here for follow-up.  Continues to complain of poor left eye vision.  He continues to follow with [facility] Kateri Mc.  Patient denies any worsening eye swelling.  He continues to have chronic discomfort from his uveitis.   Review of Systems  Constitutional:  Negative for chills, diaphoresis, fever, malaise/fatigue and weight loss.  HENT:  Negative for nosebleeds and sore throat.   Eyes:  Negative for double vision.       Left eye chronic poor vision/runny eyes.  Respiratory:   Negative for cough, hemoptysis, sputum production, shortness of breath and wheezing.   Cardiovascular:  Negative for chest pain, palpitations, orthopnea and leg swelling.  Gastrointestinal:  Negative for abdominal pain, blood in stool, constipation, diarrhea, heartburn, melena, nausea and vomiting.  Genitourinary:  Negative for dysuria, frequency and urgency.  Musculoskeletal:  Negative for back pain and joint pain.  Skin: Negative.  Negative for itching and rash.  Neurological:  Negative for dizziness, tingling, focal weakness, weakness and headaches.  Endo/Heme/Allergies:  Does not bruise/bleed easily.  Psychiatric/Behavioral:  Negative for depression. The patient is not nervous/anxious and does not have insomnia.    PAST MEDICAL HISTORY :  Past Medical History:  Diagnosis Date   Abnormal heart rhythm    Anginal pain (HCC)    Colon cancer (HCC)    sigmoid   Dysrhythmia    Headache    Hemorrhoid    History of chemotherapy    FOLFOX   Lung nodule    RIGHT/ NO PROGRESSION   Mitral valve prolapse    Pt reports Dr. Mariah Milling told him NO MVP   Rheumatoid arthritis (HCC)     PAST SURGICAL HISTORY :   Past Surgical History:  Procedure Laterality Date   AQUEOUS SHUNT Left 10/12/2016   Procedure: AQUEOUS SHUNT with scleral patch graft  Left;  Surgeon: Sherald Hess, MD;  [facility];  Service: Ophthalmology;  Laterality: Left;  tube shunt   BIOPSY Left 10/12/2016   Procedure: BIOPSY (fresh & permanent specimens sent from same location)- conjunctiva left eye;  Surgeon: Sherald Hess,  MD;  Location: [facility];  Service: Ophthalmology;  Laterality: Left;   CATARACT EXTRACTION W/PHACO Left 03/01/2018   Procedure: CATARACT EXTRACTION PHACO AND INTRAOCULAR LENS PLACEMENT (IOC);  Surgeon: Galen Manila, MD;  Location: [facility];  Service: Ophthalmology;  Laterality: Left;  [place] 01:49.2 AP% 18.6 CDE 20.32 Fluid Pack Lot # [id number] H   COLON SURGERY      COLONOSCOPY  2013, 05/16/2008   COLONOSCOPY N/A 07/29/2022   Procedure: COLONOSCOPY;  Surgeon: Toledo, Boykin Nearing, MD;  Location: [facility];  Service: Gastroenterology;  Laterality: N/A;   ESOPHAGOGASTRODUODENOSCOPY  06/04/08   EYE SURGERY Left    TUMOR BIOPSY   PORT-A-CATH REMOVAL  2010   PORTACATH PLACEMENT  06/2008   XI ROBOTIC ASSISTED INGUINAL HERNIA REPAIR WITH MESH Left 05/11/2022   Procedure: XI ROBOTIC ASSISTED INGUINAL HERNIA REPAIR WITH MESH;  Surgeon: Carolan Shiver, MD;  Location: [facility];  Service: General;  Laterality: Left;    FAMILY HISTORY :   Family History  Problem Relation Age of Onset   Arthritis Mother    Colon polyps Mother    Hypothyroidism Mother    Heart murmur Father    Valvular heart disease Father 57   Colon cancer Maternal Grandfather 59   Rheumatologic disease Maternal Aunt    Prostate cancer Neg Hx     SOCIAL HISTORY:   Social History   Tobacco Use   Smoking status: Former    Packs/day: 1.00    Years: 24.00    Additional pack years: 0.00    Total pack years: 24.00    Types: Cigarettes    Start date: 04/15/2008    Quit date: 07/27/2008    Years since quitting: 14.3   Smokeless tobacco: Former  Building services engineer Use: Never used  Substance Use Topics   Alcohol use: Not Currently    Comment: occasional alcohol use-wine   Drug use: Not Currently    Types: Marijuana    Comment: last used 1989    ALLERGIES:  is allergic to latex, lumigan [bimatoprost], and thorazine [chlorpromazine].  MEDICATIONS:  Current Outpatient Medications  Medication Sig Dispense Refill   Ascorbic Acid (VITAMIN C) 1000 MG tablet Take 1,000 mg by mouth daily.     aspirin EC 81 MG tablet Take 81 mg by mouth daily.     carboxymethylcellulose (REFRESH PLUS) 0.5 % SOLN Apply to eye.     Cholecalciferol (VITAMIN D) 2000 units CAPS Take 2,000 Units by mouth daily.     Difluprednate 0.05 % EMUL Place 1 drop into the left eye daily.     Multiple  Vitamins-Minerals (DAILY MULTI) TABS Take 1 tablet by mouth daily.     multivitamin-lutein (OCUVITE-LUTEIN) CAPS capsule Take 1 capsule by mouth daily.     polyethylene glycol powder (GLYCOLAX/MIRALAX) 17 GM/SCOOP powder Take 1 Container by mouth as needed for mild constipation.     tetrahydrozoline-zinc (VISINE-AC) 0.05-0.25 % ophthalmic solution Place 2 drops into both eyes 3 (three) times daily as needed.     No current facility-administered medications for this visit.    PHYSICAL EXAMINATION: ECOG PERFORMANCE STATUS: 0 - Asymptomatic  BP 103/79 (BP Location: Right Arm, Patient Position: Sitting, Cuff Size: Normal)   Pulse 78   Temp 98 F (36.7 C) (Tympanic)   Ht 6' (1.829 m)   Wt 177 lb (80.3 kg)   SpO2 100%   BMI 24.01 kg/m   Filed Weights   11/20/22 1031  Weight: 177 lb (80.3 kg)   Physical  Exam Constitutional:      Comments: Patient is alone.  HENT:     Head: Normocephalic and atraumatic.     Mouth/Throat:     Pharynx: No oropharyngeal exudate.  Eyes:     Pupils: Pupils are equal, round, and reactive to light.  Cardiovascular:     Rate and Rhythm: Normal rate and regular rhythm.  Pulmonary:     Effort: No respiratory distress.     Breath sounds: No wheezing.  Abdominal:     General: Bowel sounds are normal. There is no distension.     Palpations: Abdomen is soft. There is no mass.     Tenderness: There is no abdominal tenderness. There is no guarding or rebound.  Musculoskeletal:        General: No tenderness. Normal range of motion.     Cervical back: Normal range of motion and neck supple.  Skin:    General: Skin is warm.  Neurological:     Mental Status: He is alert and oriented to person, place, and time.  Psychiatric:        Mood and Affect: Affect normal.      LABORATORY DATA:  I have reviewed the data as listed    Component Value Date/Time   NA 135 11/20/2022 1027   NA 142 05/20/2020 1546   NA 138 05/03/2014 1041   K 4.4 11/20/2022 1027    K 3.8 05/03/2014 1041   CL 101 11/20/2022 1027   CL 103 05/03/2014 1041   CO2 28 11/20/2022 1027   CO2 30 05/03/2014 1041   GLUCOSE 99 11/20/2022 1027   GLUCOSE 102 (H) 05/03/2014 1041   BUN 14 11/20/2022 1027   BUN 16 05/20/2020 1546   BUN 15 05/03/2014 1041   CREATININE 0.95 11/20/2022 1027   CREATININE 0.98 11/17/2021 0845   CALCIUM 8.9 11/20/2022 1027   CALCIUM 9.2 05/03/2014 1041   PROT 7.2 11/20/2022 1027   PROT 7.5 05/03/2014 1041   ALBUMIN 3.9 11/20/2022 1027   ALBUMIN 3.6 05/03/2014 1041   AST 19 11/20/2022 1027   AST 19 05/03/2014 1041   ALT 17 11/20/2022 1027   ALT 20 05/03/2014 1041   ALKPHOS 53 11/20/2022 1027   ALKPHOS 52 05/03/2014 1041   BILITOT 0.7 11/20/2022 1027   BILITOT 0.7 05/03/2014 1041   GFRNONAA >60 11/20/2022 1027   GFRNONAA 82 09/17/2020 0814   GFRAA 95 09/17/2020 0814    No results found for: "SPEP", "UPEP"  Lab Results  Component Value Date   WBC 6.6 11/20/2022   NEUTROABS 4.1 11/20/2022   HGB 15.2 11/20/2022   HCT 44.8 11/20/2022   MCV 87.2 11/20/2022   PLT 253 11/20/2022      Chemistry      Component Value Date/Time   NA 135 11/20/2022 1027   NA 142 05/20/2020 1546   NA 138 05/03/2014 1041   K 4.4 11/20/2022 1027   K 3.8 05/03/2014 1041   CL 101 11/20/2022 1027   CL 103 05/03/2014 1041   CO2 28 11/20/2022 1027   CO2 30 05/03/2014 1041   BUN 14 11/20/2022 1027   BUN 16 05/20/2020 1546   BUN 15 05/03/2014 1041   CREATININE 0.95 11/20/2022 1027   CREATININE 0.98 11/17/2021 0845      Component Value Date/Time   CALCIUM 8.9 11/20/2022 1027   CALCIUM 9.2 05/03/2014 1041   ALKPHOS 53 11/20/2022 1027   ALKPHOS 52 05/03/2014 1041   AST 19 11/20/2022 1027   AST  19 05/03/2014 1041   ALT 17 11/20/2022 1027   ALT 20 05/03/2014 1041   BILITOT 0.7 11/20/2022 1027   BILITOT 0.7 05/03/2014 1041      ASSESSMENT & PLAN:  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 S/p RT [jan 15th 2019]- stable.   # Clinically no evidence of recurrence.;  Continue surveillance without imaging. DEC 2023- WNL- stable.   # Left eye discomfort/tearing-elated to allergies/underlying uveitis/scleritis.  stable/ [facility]- / Dr.Profilio; [facility]]  #Stage III colon cancer-at 47 years;  NED- Due for colonoscopy sec 2023- [facility]].  #Disposition : # Follow up in 12 months-MD;labs-cbc/cmp/cea- Dr.B  Cc; Dr.Karamgeglos    Earna Coder, MD 11/20/2022 12:13 PM

## 2022-11-20 NOTE — Progress Notes (Signed)
No concerns today 

## 2022-11-20 NOTE — Assessment & Plan Note (Signed)
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 S/p RT [jan 15th 2019]- stable.   # Clinically no evidence of recurrence.;  Continue surveillance without imaging. DEC 2023- WNL- stable.   # Left eye discomfort/tearing-elated to allergies/underlying uveitis/scleritis.  stable/ [facility]- / Dr.Profilio; [facility]]  #Stage III colon cancer-at 47 years;  NED- Due for colonoscopy sec 2023- [facility]].  #Disposition : # Follow up in 12 months-MD;labs-cbc/cmp/cea- Dr.B  Cc; Dr.Karamgeglos

## 2022-11-21 LAB — CEA: CEA: 2.1 ng/mL (ref 0.0–4.7)

## 2022-11-24 ENCOUNTER — Other Ambulatory Visit: Payer: Self-pay

## 2022-11-24 DIAGNOSIS — Z125 Encounter for screening for malignant neoplasm of prostate: Secondary | ICD-10-CM

## 2022-11-24 DIAGNOSIS — R7989 Other specified abnormal findings of blood chemistry: Secondary | ICD-10-CM

## 2022-11-24 DIAGNOSIS — Z0000 Encounter for general adult medical examination without abnormal findings: Secondary | ICD-10-CM

## 2022-11-24 DIAGNOSIS — R7309 Other abnormal glucose: Secondary | ICD-10-CM

## 2022-11-25 ENCOUNTER — Other Ambulatory Visit: Payer: 59

## 2022-11-25 DIAGNOSIS — R7989 Other specified abnormal findings of blood chemistry: Secondary | ICD-10-CM | POA: Diagnosis not present

## 2022-11-25 DIAGNOSIS — R7309 Other abnormal glucose: Secondary | ICD-10-CM | POA: Diagnosis not present

## 2022-11-25 DIAGNOSIS — Z125 Encounter for screening for malignant neoplasm of prostate: Secondary | ICD-10-CM | POA: Diagnosis not present

## 2022-11-25 DIAGNOSIS — Z0000 Encounter for general adult medical examination without abnormal findings: Secondary | ICD-10-CM | POA: Diagnosis not present

## 2022-11-26 LAB — CBC WITH DIFFERENTIAL/PLATELET
Absolute Monocytes: 653 cells/uL (ref 200–950)
Basophils Absolute: 21 cells/uL (ref 0–200)
Basophils Relative: 0.3 %
Eosinophils Absolute: 284 cells/uL (ref 15–500)
Eosinophils Relative: 4 %
HCT: 44.9 % (ref 38.5–50.0)
Hemoglobin: 15.1 g/dL (ref 13.2–17.1)
Lymphs Abs: 1846 cells/uL (ref 850–3900)
MCH: 30 pg (ref 27.0–33.0)
MCHC: 33.6 g/dL (ref 32.0–36.0)
MCV: 89.1 fL (ref 80.0–100.0)
MPV: 9.5 fL (ref 7.5–12.5)
Monocytes Relative: 9.2 %
Neutro Abs: 4296 cells/uL (ref 1500–7800)
Neutrophils Relative %: 60.5 %
Platelets: 243 10*3/uL (ref 140–400)
RBC: 5.04 10*6/uL (ref 4.20–5.80)
RDW: 13.4 % (ref 11.0–15.0)
Total Lymphocyte: 26 %
WBC: 7.1 10*3/uL (ref 3.8–10.8)

## 2022-11-26 LAB — HEMOGLOBIN A1C
Hgb A1c MFr Bld: 5.6 % of total Hgb (ref <5.7)
Mean Plasma Glucose: 114 mg/dL
eAG (mmol/L): 6.3 mmol/L

## 2022-11-26 LAB — COMPLETE METABOLIC PANEL WITH GFR
AG Ratio: 1.5 (calc) (ref 1.0–2.5)
ALT: 17 U/L (ref 9–46)
AST: 17 U/L (ref 10–35)
Albumin: 4.1 g/dL (ref 3.6–5.1)
Alkaline phosphatase (APISO): 55 U/L (ref 35–144)
BUN: 14 mg/dL (ref 7–25)
CO2: 27 mmol/L (ref 20–32)
Calcium: 9.1 mg/dL (ref 8.6–10.3)
Chloride: 105 mmol/L (ref 98–110)
Creat: 0.99 mg/dL (ref 0.70–1.35)
Globulin: 2.7 g/dL (calc) (ref 1.9–3.7)
Glucose, Bld: 91 mg/dL (ref 65–99)
Potassium: 4.4 mmol/L (ref 3.5–5.3)
Sodium: 140 mmol/L (ref 135–146)
Total Bilirubin: 0.7 mg/dL (ref 0.2–1.2)
Total Protein: 6.8 g/dL (ref 6.1–8.1)
eGFR: 85 mL/min/{1.73_m2} (ref >=60)

## 2022-11-26 LAB — TSH: TSH: 5 mIU/L — ABNORMAL HIGH (ref 0.40–4.50)

## 2022-11-26 LAB — PSA: PSA: 0.42 ng/mL (ref <=4.00)

## 2022-11-26 LAB — LIPID PANEL
Cholesterol: 160 mg/dL (ref <200)
HDL: 69 mg/dL (ref >=40)
LDL Cholesterol (Calc): 77 mg/dL (calc)
Non-HDL Cholesterol (Calc): 91 mg/dL (calc) (ref <130)
Total CHOL/HDL Ratio: 2.3 (calc) (ref <5.0)
Triglycerides: 63 mg/dL (ref <150)

## 2022-12-01 ENCOUNTER — Ambulatory Visit (INDEPENDENT_AMBULATORY_CARE_PROVIDER_SITE_OTHER): Payer: 59 | Admitting: Family Medicine

## 2022-12-01 ENCOUNTER — Encounter: Payer: Self-pay | Admitting: Family Medicine

## 2022-12-01 ENCOUNTER — Other Ambulatory Visit: Payer: Self-pay | Admitting: Family Medicine

## 2022-12-01 VITALS — BP 102/62 | HR 65 | Ht 72.0 in | Wt 175.0 lb

## 2022-12-01 DIAGNOSIS — M059 Rheumatoid arthritis with rheumatoid factor, unspecified: Secondary | ICD-10-CM

## 2022-12-01 DIAGNOSIS — Z0000 Encounter for general adult medical examination without abnormal findings: Secondary | ICD-10-CM

## 2022-12-01 DIAGNOSIS — R7989 Other specified abnormal findings of blood chemistry: Secondary | ICD-10-CM

## 2022-12-01 DIAGNOSIS — C884 Extranodal marginal zone B-cell lymphoma of mucosa-associated lymphoid tissue [MALT-lymphoma]: Secondary | ICD-10-CM | POA: Diagnosis not present

## 2022-12-01 DIAGNOSIS — Z6823 Body mass index (BMI) 23.0-23.9, adult: Secondary | ICD-10-CM | POA: Diagnosis not present

## 2022-12-01 DIAGNOSIS — R7309 Other abnormal glucose: Secondary | ICD-10-CM

## 2022-12-01 DIAGNOSIS — Z125 Encounter for screening for malignant neoplasm of prostate: Secondary | ICD-10-CM

## 2022-12-01 NOTE — Progress Notes (Signed)
Subjective:    Patient ID: Benjamin Clarke, male    DOB: [birth year], 62 y.o.   MRN: [id number]  Benjamin Clarke is a 62 y.o. male presenting on 12/01/2022 for Annual Exam   HPI  Here for Annual Physical and Lab Review.   [facility] - [facility] - Dr Donneta Romberg Ophthalmology - [facility] - Dr Sharee Pimple   History of Low BP / Blood Pressure, monitoring. He has not had issues with low heart rate or other issues lately.   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 He says fam history of colon CA.   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 Last lipid panel 10/2022, controlled on lifestyle - Not on Statin therapy. He is not interested at this time Taking ASA 81 daily - He has history of Cardiology work up years ago due to having history of arm pains and worry about cardiac issue he had ECHO, CT Coronary testing done, without significant blockage or cardiovascular concerns.   Elevated TSH Mild elevated TSH still but improved. Normal T4. Prior history 5.59 to 6.92, now back to 5.0 Fam history mother with hypothyroidism on synthroid. He is asymptomatic   Glaucoma Followed by [facility], due for refraction and new lenses - required shunt after failed eye drops for pressure   PMH L Inguinal Hernia  S/p L Inguinal Hernia Robotic repair 04/2022, Dr Hazle Quant   History Pulm Nodule - prior CT >5+ years ago, no recent nodules seen on CT imaging 2021 Former smoker, quit 2010  Additional topic Easy Bruising from blood draws not associated with other issues.   Health Maintenance:   Colon CA  Screening: Last Colonoscopy done 07/29/22 - Dr Norma [facility]. Repeat 3 years. Next   UTD Hep C screening negative, 06/03/2016 UTD HIV screening negative 08/2019   Prostate CA Screening: Prior history of DRE reported normal >10 years ago. Last PSA 0.42 10/2022. Currently minimal symptoms with mild nocturia x 1, and some residual urine leakage, slightly weaker urine flow. No known family history of prostate CA.      12/01/2022    8:28 AM 04/10/2022    9:45 AM 11/28/2021    8:44 AM  Depression screen PHQ 2/9  Decreased Interest 0 0 0  Down, Depressed, Hopeless 0 0 0  PHQ - 2 Score 0 0 0  Altered sleeping  0   Tired, decreased energy  0   Change in appetite  0   Feeling bad or failure about yourself   0   Trouble concentrating  0   Moving slowly or fidgety/restless  0   Suicidal thoughts  0   PHQ-9 Score  0   Difficult doing work/chores  Not difficult at all     Past Medical History:  Diagnosis Date   Abnormal heart rhythm    Anginal pain (HCC)    Colon cancer (HCC)    sigmoid   Dysrhythmia    Headache    Hemorrhoid    History of chemotherapy    FOLFOX   Lung nodule    RIGHT/ NO PROGRESSION   Mitral valve prolapse    Pt reports Dr.  Gollan told him NO MVP   Rheumatoid arthritis (HCC)    Past Surgical History:  Procedure Laterality Date   AQUEOUS SHUNT Left 10/12/2016   Procedure: AQUEOUS SHUNT with scleral patch graft  Left;  Surgeon: Sherald Hess, MD;  Location: [facility];  Service: Ophthalmology;  Laterality: Left;  tube shunt   BIOPSY Left 10/12/2016   Procedure: BIOPSY (fresh & permanent specimens sent from same location)- conjunctiva left eye;  Surgeon: Sherald Hess, MD;  Location: [facility];  Service: Ophthalmology;  Laterality: Left;   CATARACT EXTRACTION W/PHACO Left 03/01/2018   Procedure: CATARACT EXTRACTION PHACO AND INTRAOCULAR LENS PLACEMENT (IOC);  Surgeon: Galen Manila, MD;  Location: [facility];  Service: Ophthalmology;   Laterality: Left;  [place] 01:49.2 AP% 18.6 CDE 20.32 Fluid Pack Lot # [id number]   COLON SURGERY     COLONOSCOPY  2013, 05/16/2008   COLONOSCOPY N/A 07/29/2022   Procedure: COLONOSCOPY;  Surgeon: Toledo, Boykin Nearing, MD;  Location: [facility];  Service: Gastroenterology;  Laterality: N/A;   ESOPHAGOGASTRODUODENOSCOPY  06/04/08   EYE SURGERY Left    TUMOR BIOPSY   PORT-A-CATH REMOVAL  2010   PORTACATH PLACEMENT  06/2008   XI ROBOTIC ASSISTED INGUINAL HERNIA REPAIR WITH MESH Left 05/11/2022   Procedure: XI ROBOTIC ASSISTED INGUINAL HERNIA REPAIR WITH MESH;  Surgeon: Carolan Shiver, MD;  Location: [facility];  Service: General;  Laterality: Left;   Social History   Socioeconomic History   Marital status: Single    Spouse name: Not on file   Number of children: Not on file   Years of education: College   Highest education level: Not on file  Occupational History   Not on file  Tobacco Use   Smoking status: Former    Packs/day: 1.00    Years: 24.00    Additional pack years: 0.00    Total pack years: 24.00    Types: Cigarettes    Start date: 04/15/2008    Quit date: 07/27/2008    Years since quitting: 14.3   Smokeless tobacco: Former  Building services engineer Use: Never used  Substance and Sexual Activity   Alcohol use: Not Currently    Comment: occasional alcohol use-wine   Drug use: Not Currently    Types: Marijuana    Comment: last used 1989   Sexual activity: Not on file  Other Topics Concern   Not on file  Social History Narrative   Retired. Single. Regularly exercises.    Social Determinants of Health   Financial Resource Strain: Not on file  Food Insecurity: Not on file  Transportation Needs: Not on file  Physical Activity: Not on file  Stress: Not on file  Social Connections: Not on file  Intimate Partner Violence: Not on file   Family History  Problem Relation Age of Onset   Arthritis Mother    Colon polyps Mother    Hypothyroidism Mother    Heart murmur  Father    Valvular heart disease Father 54   Colon cancer Maternal Grandfather 19   Rheumatologic disease Maternal Aunt    Prostate cancer Neg Hx    Current Outpatient Medications on File Prior to Visit  Medication Sig   Ascorbic Acid (VITAMIN C) 1000 MG tablet Take 1,000 mg by mouth daily.   aspirin EC 81 MG tablet Take 81 mg by mouth daily.   carboxymethylcellulose (REFRESH PLUS) 0.5 % SOLN Apply to eye.   Cholecalciferol (VITAMIN D) 2000 units CAPS Take 2,000 Units  by mouth daily.   Difluprednate 0.05 % EMUL Place 1 drop into the left eye daily.   Multiple Vitamins-Minerals (DAILY MULTI) TABS Take 1 tablet by mouth daily.   multivitamin-lutein (OCUVITE-LUTEIN) CAPS capsule Take 1 capsule by mouth daily.   polyethylene glycol powder (GLYCOLAX/MIRALAX) 17 GM/SCOOP powder Take 1 Container by mouth as needed for mild constipation.   tetrahydrozoline-zinc (VISINE-AC) 0.05-0.25 % ophthalmic solution Place 2 drops into both eyes 3 (three) times daily as needed.   No current facility-administered medications on file prior to visit.    Review of Systems  Constitutional:  Negative for activity change, appetite change, chills, diaphoresis, fatigue and fever.  HENT:  Negative for congestion and hearing loss.   Eyes:  Negative for visual disturbance.  Respiratory:  Negative for cough, chest tightness, shortness of breath and wheezing.   Cardiovascular:  Negative for chest pain, palpitations and leg swelling.  Gastrointestinal:  Negative for abdominal pain, constipation, diarrhea, nausea and vomiting.  Genitourinary:  Negative for dysuria, frequency and hematuria.  Musculoskeletal:  Negative for arthralgias and neck pain.  Skin:  Negative for rash.  Neurological:  Negative for dizziness, weakness, light-headedness, numbness and headaches.  Hematological:  Negative for adenopathy. Bruises/bleeds easily.  Psychiatric/Behavioral:  Negative for behavioral problems, dysphoric mood and sleep  disturbance.    Per HPI unless specifically indicated above      Objective:    BP 102/62   Pulse 65   Ht 6' (1.829 m)   Wt 175 lb (79.4 kg)   SpO2 98%   BMI 23.73 kg/m   Wt Readings from Last 3 Encounters:  12/01/22 175 lb (79.4 kg)  11/20/22 177 lb (80.3 kg)  07/29/22 169 lb 11.2 oz (77 kg)    Physical Exam Vitals and nursing note reviewed.  Constitutional:      General: He is not in acute distress.    Appearance: He is well-developed. He is not diaphoretic.     Comments: Well-appearing, comfortable, cooperative  HENT:     Head: Normocephalic and atraumatic.  Eyes:     General:        Right eye: No discharge.        Left eye: No discharge.     Conjunctiva/sclera: Conjunctivae normal.     Pupils: Pupils are equal, round, and reactive to light.  Neck:     Thyroid: No thyromegaly.  Cardiovascular:     Rate and Rhythm: Normal rate and regular rhythm.     Pulses: Normal pulses.     Heart sounds: Normal heart sounds. No murmur heard. Pulmonary:     Effort: Pulmonary effort is normal. No respiratory distress.     Breath sounds: Normal breath sounds. No wheezing or rales.  Abdominal:     General: Bowel sounds are normal. There is no distension.     Palpations: Abdomen is soft. There is no mass.     Tenderness: There is no abdominal tenderness.  Musculoskeletal:        General: No tenderness. Normal range of motion.     Cervical back: Normal range of motion and neck supple.     Comments: Upper / Lower Extremities: - Normal muscle tone, strength bilateral upper extremities 5/5, lower extremities 5/5  Lymphadenopathy:     Cervical: No cervical adenopathy.  Skin:    General: Skin is warm and dry.     Findings: Bruising (inner arms from blood draw residual now) present. No erythema or rash.  Neurological:     Mental Status:  He is alert and oriented to person, place, and time.     Comments: Distal sensation intact to light touch all extremities  Psychiatric:         Mood and Affect: Mood normal.        Behavior: Behavior normal.        Thought Content: Thought content normal.     Comments: Well groomed, good eye contact, normal speech and thoughts     ADDENDUM REPORT: 06/06/2020 09:22   EXAM: OVER-READ INTERPRETATION  CT CHEST   The following report is an over-read performed by radiologist Dr. Lesia Hausen [facility] on 06/06/2020. This over-read does not include interpretation of cardiac or coronary anatomy or pathology. The coronary CTA interpretation by the cardiologist is attached.   COMPARISON:  04/02/2017 CT chest, abdomen and pelvis.   FINDINGS: Cardiovascular: Normal heart size. No significant pericardial effusion/thickening. Great vessels are normal in course and caliber. No central pulmonary emboli.   Mediastinum/Nodes: Unremarkable esophagus. No pathologically enlarged mediastinal or hilar lymph nodes.   Lungs/Pleura: No pneumothorax. No pleural effusion. No acute consolidative airspace disease, lung masses or significant pulmonary nodules.   Upper abdomen: No acute abnormality.   Musculoskeletal: No aggressive appearing focal osseous lesions. Mild thoracic spondylosis.   IMPRESSION: No significant extracardiac findings.     Electronically Signed   By: Delbert Phenix M.D.   On: 06/06/2020 09:22  Results for orders placed or performed in visit on 11/24/22  COMPLETE METABOLIC PANEL WITH GFR  Result Value Ref Range   Glucose, Bld 91 65 - 99 mg/dL   BUN 14 7 - 25 mg/dL   Creat 4.54 0.98 - 1.19 mg/dL   eGFR 85 > OR = 60 JY/NWG/9.56O1   BUN/Creatinine Ratio SEE NOTE: 6 - 22 (calc)   Sodium 140 135 - 146 mmol/L   Potassium 4.4 3.5 - 5.3 mmol/L   Chloride 105 98 - 110 mmol/L   CO2 27 20 - 32 mmol/L   Calcium 9.1 8.6 - 10.3 mg/dL   Total Protein 6.8 6.1 - 8.1 g/dL   Albumin 4.1 3.6 - 5.1 g/dL   Globulin 2.7 1.9 - 3.7 g/dL (calc)   AG Ratio 1.5 1.0 - 2.5 (calc)   Total Bilirubin 0.7 0.2 - 1.2 mg/dL    Alkaline phosphatase (APISO) 55 35 - 144 U/L   AST 17 10 - 35 U/L   ALT 17 9 - 46 U/L  Lipid panel  Result Value Ref Range   Cholesterol 160 <200 mg/dL   HDL 69 > OR = 40 mg/dL   Triglycerides 63 <308 mg/dL   LDL Cholesterol (Calc) 77 mg/dL (calc)   Total CHOL/HDL Ratio 2.3 <5.0 (calc)   Non-HDL Cholesterol (Calc) 91 <657 mg/dL (calc)  TSH  Result Value Ref Range   TSH 5.00 (H) 0.40 - 4.50 mIU/L  CBC with Differential/Platelet  Result Value Ref Range   WBC 7.1 3.8 - 10.8 Thousand/uL   RBC 5.04 4.20 - 5.80 Million/uL   Hemoglobin 15.1 13.2 - 17.1 g/dL   HCT 84.6 96.2 - 95.2 %   MCV 89.1 80.0 - 100.0 fL   MCH 30.0 27.0 - 33.0 pg   MCHC 33.6 32.0 - 36.0 g/dL   RDW 84.1 32.4 - 40.1 %   Platelets 243 140 - 400 Thousand/uL   MPV 9.5 7.5 - 12.5 fL   Neutro Abs 4,296 1,500 - 7,800 cells/uL   Lymphs Abs 1,846 850 - 3,900 cells/uL   Absolute Monocytes 653 200 -  950 cells/uL   Eosinophils Absolute 284 15 - 500 cells/uL   Basophils Absolute 21 0 - 200 cells/uL   Neutrophils Relative % 60.5 %   Total Lymphocyte 26.0 %   Monocytes Relative 9.2 %   Eosinophils Relative 4.0 %   Basophils Relative 0.3 %  PSA  Result Value Ref Range   PSA 0.42 < OR = 4.00 ng/mL  HgB A1c  Result Value Ref Range   Hgb A1c MFr Bld 5.6 <5.7 % of total Hgb   Mean Plasma Glucose 114 mg/dL   eAG (mmol/L) 6.3 mmol/L      Assessment & Plan:   Problem List Items Addressed This Visit     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  PSA negative  CEA was negative - cancer screening biomarker  Last CT imaging 2021 showed no pulm nodules. Let me know if want to  repeat  Colonoscopy 07/2022 > next 3 years, 2027  No orders of the defined types were placed in this encounter.     Follow up plan: Return in about 1 year (around 12/01/2023) for 1 year fasting lab only then 1 week later Annual Physical.  Future labs 12/01/23  Saralyn Pilar, DO [facility] 12/01/2022, 8:44 AM

## 2022-12-01 NOTE — Patient Instructions (Addendum)
Thank you for coming to the office today.  Labs look good overall.  PSA negative  CEA was negative - cancer screening biomarker  Last CT imaging 2021 showed no pulm nodules. Let me know if want to repeat  Colonoscopy 07/2022 > next 3 years, 2027   DUE for FASTING BLOOD WORK (no food or drink after midnight before the lab appointment, only water or coffee without cream/sugar on the morning of)  SCHEDULE "Lab Only" visit in the morning at the clinic for lab draw in 1 YEAR  - Make sure Lab Only appointment is at about 1 week before your next appointment, so that results will be available  For Lab Results, once available within 2-3 days of blood draw, you can can log in to MyChart online to view your results and a brief explanation. Also, we can discuss results at next follow-up visit.   Please schedule a Follow-up Appointment to: Return in about 1 year (around 12/01/2023) for 1 year fasting lab only then 1 week later Annual Physical.  If you have any other questions or concerns, please feel free to call the office or send a message through MyChart. You may also schedule an earlier appointment if necessary.  Additionally, you may be receiving a survey about your experience at our office within a few days to 1 week by e-mail or mail. We value your feedback.  Saralyn Pilar, DO [facility], [place]

## 2023-01-27 DIAGNOSIS — H25813 Combined forms of age-related cataract, bilateral: Secondary | ICD-10-CM | POA: Diagnosis not present

## 2023-01-27 DIAGNOSIS — H15032 Posterior scleritis, left eye: Secondary | ICD-10-CM | POA: Diagnosis not present

## 2023-01-27 DIAGNOSIS — H4042X4 Glaucoma secondary to eye inflammation, left eye, indeterminate stage: Secondary | ICD-10-CM | POA: Diagnosis not present

## 2023-01-27 DIAGNOSIS — C884 Extranodal marginal zone B-cell lymphoma of mucosa-associated lymphoid tissue [MALT-lymphoma]: Secondary | ICD-10-CM | POA: Diagnosis not present

## 2023-01-27 DIAGNOSIS — H209 Unspecified iridocyclitis: Secondary | ICD-10-CM | POA: Diagnosis not present

## 2023-01-27 DIAGNOSIS — H43823 Vitreomacular adhesion, bilateral: Secondary | ICD-10-CM | POA: Diagnosis not present

## 2023-01-27 DIAGNOSIS — H5213 Myopia, bilateral: Secondary | ICD-10-CM | POA: Diagnosis not present

## 2023-08-12 ENCOUNTER — Ambulatory Visit: Payer: 59 | Admitting: Dermatology

## 2023-08-24 ENCOUNTER — Encounter: Payer: Self-pay | Admitting: Dermatology

## 2023-08-24 ENCOUNTER — Ambulatory Visit: Payer: 59 | Admitting: Dermatology

## 2023-08-24 DIAGNOSIS — D1801 Hemangioma of skin and subcutaneous tissue: Secondary | ICD-10-CM | POA: Diagnosis not present

## 2023-08-24 DIAGNOSIS — L820 Inflamed seborrheic keratosis: Secondary | ICD-10-CM | POA: Diagnosis not present

## 2023-08-24 DIAGNOSIS — Z872 Personal history of diseases of the skin and subcutaneous tissue: Secondary | ICD-10-CM

## 2023-08-24 DIAGNOSIS — Z808 Family history of malignant neoplasm of other organs or systems: Secondary | ICD-10-CM | POA: Diagnosis not present

## 2023-08-24 DIAGNOSIS — L570 Actinic keratosis: Secondary | ICD-10-CM | POA: Diagnosis not present

## 2023-08-24 DIAGNOSIS — L821 Other seborrheic keratosis: Secondary | ICD-10-CM

## 2023-08-24 DIAGNOSIS — W908XXA Exposure to other nonionizing radiation, initial encounter: Secondary | ICD-10-CM

## 2023-08-24 DIAGNOSIS — L814 Other melanin hyperpigmentation: Secondary | ICD-10-CM

## 2023-08-24 DIAGNOSIS — L578 Other skin changes due to chronic exposure to nonionizing radiation: Secondary | ICD-10-CM

## 2023-08-24 DIAGNOSIS — D229 Melanocytic nevi, unspecified: Secondary | ICD-10-CM

## 2023-08-24 DIAGNOSIS — Z1283 Encounter for screening for malignant neoplasm of skin: Secondary | ICD-10-CM | POA: Diagnosis not present

## 2023-08-24 NOTE — Patient Instructions (Signed)

## 2023-08-24 NOTE — Progress Notes (Signed)
Follow-Up Visit   Subjective  Benjamin Clarke is a 63 y.o. male who presents for the following: Skin Cancer Screening and Full Body Skin Exam. Hx of AK. No personal Hx of skin cancer. Mother has hx of SCC.  The patient presents for Total-Body Skin Exam (TBSE) for skin cancer screening and mole check. The patient has spots, moles and lesions to be evaluated, some may be new or changing and the patient may have concern these could be cancer.    The following portions of the chart were reviewed this encounter and updated as appropriate: medications, allergies, medical history  Review of Systems:  No other skin or systemic complaints except as noted in HPI or Assessment and Plan.  Objective  Well appearing patient in no apparent distress; mood and affect are within normal limits.  A full examination was performed including scalp, head, eyes, ears, nose, lips, neck, chest, axillae, abdomen, back, buttocks, bilateral upper extremities, bilateral lower extremities, hands, feet, fingers, toes, fingernails, and toenails. All findings within normal limits unless otherwise noted below.   Relevant physical exam findings are noted in the Assessment and Plan.    Assessment & Plan   FAMILY HISTORY OF SKIN CANCER What type(s): SCC Who affected: Mother   SKIN CANCER SCREENING PERFORMED TODAY.  ACTINIC DAMAGE - Chronic condition, secondary to cumulative UV/sun exposure - diffuse scaly erythematous macules with underlying dyspigmentation - Recommend daily broad spectrum sunscreen SPF 30+ to sun-exposed areas, reapply every 2 hours as needed.  - Staying in the shade or wearing long sleeves, sun glasses (UVA+UVB protection) and wide brim hats (4-inch brim around the entire circumference of the hat) are also recommended for sun protection.  - Call for new or changing lesions.  LENTIGINES, SEBORRHEIC KERATOSES, HEMANGIOMAS - Benign normal skin lesions - Benign-appearing - Call for any  changes  MELANOCYTIC NEVI - Tan-brown and/or pink-flesh-colored symmetric macules and papules - Benign appearing on exam today - Observation - Call clinic for new or changing moles - Recommend daily use of broad spectrum spf 30+ sunscreen to sun-exposed areas.   ACTINIC DAMAGE WITH PRECANCEROUS ACTINIC KERATOSES Counseling for Topical Chemotherapy Management: Patient exhibits: - Severe, confluent actinic changes with pre-cancerous actinic keratoses that is secondary to cumulative UV radiation exposure over time - Condition that is severe; chronic, not at goal. - diffuse scaly erythematous macules and papules with underlying dyspigmentation - Discussed Prescription "Field Treatment" topical Chemotherapy for Severe, Chronic Confluent Actinic Changes with Pre-Cancerous Actinic Keratoses Field treatment involves treatment of an entire area of skin that has confluent Actinic Changes (Sun/ Ultraviolet light damage) and PreCancerous Actinic Keratoses by method of PhotoDynamic Therapy (PDT) and/or prescription Topical Chemotherapy agents such as 5-fluorouracil, 5-fluorouracil/calcipotriene, and/or imiquimod.  The purpose is to decrease the number of clinically evident and subclinical PreCancerous lesions to prevent progression to development of skin cancer by chemically destroying early precancer changes that may or may not be visible.  It has been shown to reduce the risk of developing skin cancer in the treated area. As a result of treatment, redness, scaling, crusting, and open sores may occur during treatment course. One or more than one of these methods may be used and may have to be used several times to control, suppress and eliminate the PreCancerous changes. Discussed treatment course, expected reaction, and possible side effects. - Recommend daily broad spectrum sunscreen SPF 30+ to sun-exposed areas, reapply every 2 hours as needed.  - Staying in the shade or wearing long sleeves, sun  glasses  (UVA+UVB protection) and wide brim hats (4-inch brim around the entire circumference of the hat) are also recommended. - Call for new or changing lesions. - patient declines field treatment    MULTIPLE BENIGN NEVI   ACTINIC ELASTOSIS   LENTIGINES   SEBORRHEIC KERATOSES   CHERRY ANGIOMA   ACTINIC KERATOSES   INFLAMED SEBORRHEIC KERATOSIS   Return in about 1 year (around 08/23/2024) for TBSE.  I, Lawson Radar, CMA, am acting as scribe for Elie Goody, MD.   Documentation: I have reviewed the above documentation for accuracy and completeness, and I agree with the above.  Elie Goody, MD

## 2023-11-15 ENCOUNTER — Telehealth: Payer: Self-pay | Admitting: *Deleted

## 2023-11-15 NOTE — Telephone Encounter (Signed)
 Patient states that he has colon cancer and he has had it 13 years, he turned 49 and now medicare and he wanted to know how much money out of pocket for labs and MD visit would cost until he works out to get extra insurance to help visit , labs, and medications. I sent a message to Jule Nyhan to see if she can help him

## 2023-11-19 ENCOUNTER — Inpatient Hospital Stay: Payer: 59

## 2023-11-19 ENCOUNTER — Inpatient Hospital Stay: Payer: 59 | Admitting: Internal Medicine

## 2023-11-25 ENCOUNTER — Telehealth: Payer: Self-pay | Admitting: *Deleted

## 2023-11-25 NOTE — Telephone Encounter (Signed)
 Patient called and he has a new insurance and he wanted to know that if [employer] advantage premier PPO, he said that he spoke to a person with [employer] and he was told that finnegan is in it and they could not see [employer], the person in insurance suggested to call here  to see about it. He is a pt. Of [facility] and he will be comming in in 1 month.

## 2023-12-01 ENCOUNTER — Other Ambulatory Visit: Payer: Self-pay

## 2023-12-01 DIAGNOSIS — Z125 Encounter for screening for malignant neoplasm of prostate: Secondary | ICD-10-CM | POA: Diagnosis not present

## 2023-12-01 DIAGNOSIS — Z0000 Encounter for general adult medical examination without abnormal findings: Secondary | ICD-10-CM

## 2023-12-01 DIAGNOSIS — R7309 Other abnormal glucose: Secondary | ICD-10-CM | POA: Diagnosis not present

## 2023-12-01 DIAGNOSIS — M059 Rheumatoid arthritis with rheumatoid factor, unspecified: Secondary | ICD-10-CM

## 2023-12-01 DIAGNOSIS — C884 Extranodal marginal zone b-cell lymphoma of mucosa-associated lymphoid tissue (malt-lymphoma) not having achieved remission: Secondary | ICD-10-CM

## 2023-12-01 DIAGNOSIS — R7989 Other specified abnormal findings of blood chemistry: Secondary | ICD-10-CM | POA: Diagnosis not present

## 2023-12-01 DIAGNOSIS — E039 Hypothyroidism, unspecified: Secondary | ICD-10-CM | POA: Diagnosis not present

## 2023-12-02 LAB — HEMOGLOBIN A1C
Hgb A1c MFr Bld: 5.7 % — ABNORMAL HIGH (ref <5.7)
Mean Plasma Glucose: 117 mg/dL
eAG (mmol/L): 6.5 mmol/L

## 2023-12-02 LAB — CBC WITH DIFFERENTIAL/PLATELET
Absolute Lymphocytes: 1469 {cells}/uL (ref 850–3900)
Absolute Monocytes: 813 {cells}/uL (ref 200–950)
Basophils Absolute: 20 {cells}/uL (ref 0–200)
Basophils Relative: 0.3 %
Eosinophils Absolute: 221 {cells}/uL (ref 15–500)
Eosinophils Relative: 3.4 %
HCT: 45.8 % (ref 38.5–50.0)
Hemoglobin: 15.4 g/dL (ref 13.2–17.1)
MCH: 29.8 pg (ref 27.0–33.0)
MCHC: 33.6 g/dL (ref 32.0–36.0)
MCV: 88.8 fL (ref 80.0–100.0)
MPV: 9.8 fL (ref 7.5–12.5)
Monocytes Relative: 12.5 %
Neutro Abs: 3978 {cells}/uL (ref 1500–7800)
Neutrophils Relative %: 61.2 %
Platelets: 262 10*3/uL (ref 140–400)
RBC: 5.16 10*6/uL (ref 4.20–5.80)
RDW: 13.4 % (ref 11.0–15.0)
Total Lymphocyte: 22.6 %
WBC: 6.5 10*3/uL (ref 3.8–10.8)

## 2023-12-02 LAB — COMPLETE METABOLIC PANEL WITHOUT GFR
AG Ratio: 1.5 (calc) (ref 1.0–2.5)
ALT: 13 U/L (ref 9–46)
AST: 16 U/L (ref 10–35)
Albumin: 4.1 g/dL (ref 3.6–5.1)
Alkaline phosphatase (APISO): 54 U/L (ref 35–144)
BUN: 14 mg/dL (ref 7–25)
CO2: 29 mmol/L (ref 20–32)
Calcium: 9.2 mg/dL (ref 8.6–10.3)
Chloride: 103 mmol/L (ref 98–110)
Creat: 1.07 mg/dL (ref 0.70–1.35)
Globulin: 2.7 g/dL (ref 1.9–3.7)
Glucose, Bld: 90 mg/dL (ref 65–99)
Potassium: 4.4 mmol/L (ref 3.5–5.3)
Sodium: 139 mmol/L (ref 135–146)
Total Bilirubin: 0.6 mg/dL (ref 0.2–1.2)
Total Protein: 6.8 g/dL (ref 6.1–8.1)

## 2023-12-02 LAB — LIPID PANEL
Cholesterol: 152 mg/dL (ref <200)
HDL: 62 mg/dL (ref >=40)
LDL Cholesterol (Calc): 77 mg/dL
Non-HDL Cholesterol (Calc): 90 mg/dL (ref <130)
Total CHOL/HDL Ratio: 2.5 (calc) (ref <5.0)
Triglycerides: 47 mg/dL (ref <150)

## 2023-12-02 LAB — PSA: PSA: 0.5 ng/mL (ref <=4.00)

## 2023-12-02 LAB — T4, FREE: Free T4: 1.1 ng/dL (ref 0.8–1.8)

## 2023-12-02 LAB — TSH: TSH: 4.96 m[IU]/L — ABNORMAL HIGH (ref 0.40–4.50)

## 2023-12-08 ENCOUNTER — Ambulatory Visit (INDEPENDENT_AMBULATORY_CARE_PROVIDER_SITE_OTHER): Payer: Self-pay | Admitting: Family Medicine

## 2023-12-08 ENCOUNTER — Encounter: Payer: Self-pay | Admitting: Family Medicine

## 2023-12-08 VITALS — BP 122/70 | HR 82 | Ht 72.0 in | Wt 177.5 lb

## 2023-12-08 DIAGNOSIS — R7989 Other specified abnormal findings of blood chemistry: Secondary | ICD-10-CM | POA: Diagnosis not present

## 2023-12-08 DIAGNOSIS — Z0000 Encounter for general adult medical examination without abnormal findings: Secondary | ICD-10-CM | POA: Diagnosis not present

## 2023-12-08 DIAGNOSIS — R7309 Other abnormal glucose: Secondary | ICD-10-CM

## 2023-12-08 NOTE — Progress Notes (Signed)
 Subjective:    Patient ID: Benjamin Clarke, male    DOB: 04/23/1961, 63 y.o.   MRN: [id number]  Benjamin Clarke is a 63 y.o. male presenting on 12/08/2023 for Annual Exam   HPI  Here for Annual Physical and Lab Review.   [facility] Oncology - [facility] - Dr Valentine Gasmen Ophthalmology - [facility] - Dr Miller Allis   History of Low BP / Blood Pressure, monitoring. He has not had issues with low heart rate or other issues lately. Today he had mild elevated BP 120s, but feels fine. 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 He says fam history of colon CA.   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 Last lipid panel 11/2023, controlled on lifestyle - Not on Statin therapy. He is not interested at this time Taking ASA 81 daily - He has history of Cardiology work up years ago due to having history of arm pains and worry about cardiac issue he had ECHO, CT Coronary testing done, without significant blockage or cardiovascular concerns.  Elevated A1c Lab elevated A1c 5.7 Attributed to diet   Elevated TSH Mild elevated TSH still but improved. Normal T4. Prior history 5.59 to 6.92, now back to 5.0 Fam history mother with hypothyroidism on synthroid. He is asymptomatic  Former smoker, quit 2010       Health Maintenance:   Colon CA Screening: Last Colonoscopy done 07/29/22 - Dr Corky Diener [facility]. Repeat 3 years. Next due 2027   UTD Hep C screening negative, 06/03/2016 UTD HIV screening negative 08/2019   Prostate CA Screening: Prior history of DRE  reported normal >10 years ago. Last PSA 0.5 2025. Currently minimal symptoms with mild nocturia x 1, and some residual urine leakage, slightly weaker urine flow. No known family history of prostate CA.   Future consideration Prevnar-20. He declines today, not interested in vaccines.     12/08/2023    9:12 AM 12/01/2022    8:28 AM 04/10/2022    9:45 AM  Depression screen PHQ 2/9  Decreased Interest 0 0 0  Down, Depressed, Hopeless 0 0 0  PHQ - 2 Score 0 0 0  Altered sleeping   0  Tired, decreased energy   0  Change in appetite   0  Feeling bad or failure about yourself    0  Trouble concentrating   0  Moving slowly or fidgety/restless   0  Suicidal thoughts   0  PHQ-9 Score   0  Difficult doing work/chores   Not difficult at all       12/08/2023    9:12 AM 12/01/2022    8:28 AM 04/10/2022    9:45 AM  GAD 7 : Generalized Anxiety Score  Nervous, Anxious, on Edge 0 0 0  Control/stop worrying 0 0 0  Worry too much - different things 0 0 0  Trouble relaxing 0 0 0  Restless 0 0 0  Easily annoyed or irritable 0 0 0  Afraid - awful might happen 0 0 0  Total GAD 7 Score 0 0 0  Anxiety Difficulty  Not difficult at all     Past Medical History:  Diagnosis Date   Abnormal heart rhythm    Anginal pain (HCC)    Colon cancer (HCC)    sigmoid   Dysrhythmia    Headache    Hemorrhoid    History of chemotherapy    FOLFOX   Lung nodule    RIGHT/ NO PROGRESSION   Mitral valve prolapse    Pt reports Dr. Jerelene Monday told him NO MVP   Rheumatoid arthritis [facility])    Past Surgical History:  Procedure Laterality Date   AQUEOUS SHUNT Left 10/12/2016   Procedure: AQUEOUS SHUNT with scleral patch graft  Left;  Surgeon: Billee Buddle, MD;  Location: [facility];  Service: Ophthalmology;  Laterality: Left;  tube shunt   BIOPSY Left 10/12/2016   Procedure: BIOPSY (fresh & permanent specimens sent from same location)- conjunctiva left eye;  Surgeon: Billee Buddle, MD;   Location: [facility];  Service: Ophthalmology;  Laterality: Left;   CATARACT EXTRACTION W/PHACO Left 03/01/2018   Procedure: CATARACT EXTRACTION PHACO AND INTRAOCULAR LENS PLACEMENT (IOC);  Surgeon: Clair Crews, MD;  Location: [facility];  Service: Ophthalmology;  Laterality: Left;  US  01:49.2 AP% 18.6 CDE 20.32 Fluid Pack Lot # [id number]   COLON SURGERY     COLONOSCOPY  2013, 05/16/2008   COLONOSCOPY N/A 07/29/2022   Procedure: COLONOSCOPY;  Surgeon: Toledo, Alphonsus Jeans, MD;  Location: [facility];  Service: Gastroenterology;  Laterality: N/A;   ESOPHAGOGASTRODUODENOSCOPY  06/04/08   EYE SURGERY Left    TUMOR BIOPSY   PORT-A-CATH REMOVAL  2010   PORTACATH PLACEMENT  06/2008   XI ROBOTIC ASSISTED INGUINAL HERNIA REPAIR WITH MESH Left 05/11/2022   Procedure: XI ROBOTIC ASSISTED INGUINAL HERNIA REPAIR WITH MESH;  Surgeon: Eldred Grego, MD;  Location: [facility];  Service: General;  Laterality: Left;   Social History   Socioeconomic History   Marital status: Single    Spouse name: Not on file   Number of children: Not on file   Years of education: College   Highest education level: Not on file  Occupational History   Not on file  Tobacco Use   Smoking status: Former    Current packs/day: 0.00    Average packs/day: 1 pack/day for 24.0 years (24.0 ttl pk-yrs)    Types: Cigarettes    Start date: 04/15/2008    Quit date: 07/27/2008    Years since quitting: 15.3   Smokeless tobacco: Former  Building services engineer status: Never Used  Substance and Sexual Activity   Alcohol use: Not Currently    Comment: occasional alcohol use-wine   Drug use: Not Currently    Types: Marijuana    Comment: last used 1989   Sexual activity: Not on file  Other Topics Concern   Not on file  Social History Narrative   Retired. Single. Regularly exercises.    Social Drivers of Corporate investment banker Strain: Not on file  Food Insecurity: Not on file  Transportation Needs: Not on  file  Physical Activity: Not on file  Stress: Not on file  Social Connections: Not on file  Intimate Partner Violence: Not on file   Family History  Problem Relation Age of Onset   Arthritis Mother    Colon polyps Mother    Hypothyroidism Mother    Heart murmur Father    Valvular heart disease Father 22   Colon cancer Maternal Grandfather 59   Rheumatologic disease Maternal  Aunt    Prostate cancer Neg Hx    Current Outpatient Medications on File Prior to Visit  Medication Sig   Ascorbic Acid (VITAMIN C) 1000 MG tablet Take 1,000 mg by mouth daily.   aspirin EC 81 MG tablet Take 81 mg by mouth daily.   Calcium Carb-Cholecalciferol (CALCIUM 1000 + D PO) Take by mouth.   carboxymethylcellulose (REFRESH PLUS) 0.5 % SOLN Apply to eye.   Cholecalciferol (VITAMIN D) 2000 units CAPS Take 2,000 Units by mouth daily.   Difluprednate 0.05 % EMUL Place 1 drop into the left eye daily.   Multiple Vitamins-Minerals (DAILY MULTI) TABS Take 1 tablet by mouth daily.   multivitamin-lutein (OCUVITE-LUTEIN) CAPS capsule Take 1 capsule by mouth daily.   polyethylene glycol powder (GLYCOLAX/MIRALAX) 17 GM/SCOOP powder Take 1 Container by mouth as needed for mild constipation.   tetrahydrozoline-zinc (VISINE-AC) 0.05-0.25 % ophthalmic solution Place 2 drops into both eyes 3 (three) times daily as needed.   No current facility-administered medications on file prior to visit.    Review of Systems  Constitutional:  Negative for activity change, appetite change, chills, diaphoresis, fatigue and fever.  HENT:  Negative for congestion and hearing loss.   Eyes:  Negative for visual disturbance.  Respiratory:  Negative for cough, chest tightness, shortness of breath and wheezing.   Cardiovascular:  Negative for chest pain, palpitations and leg swelling.  Gastrointestinal:  Negative for abdominal pain, constipation, diarrhea, nausea and vomiting.  Genitourinary:  Negative for dysuria, frequency and hematuria.   Musculoskeletal:  Negative for arthralgias and neck pain.  Skin:  Negative for rash.  Neurological:  Negative for dizziness, weakness, light-headedness, numbness and headaches.  Hematological:  Negative for adenopathy.  Psychiatric/Behavioral:  Negative for behavioral problems, dysphoric mood and sleep disturbance.    Per HPI unless specifically indicated above     Objective:     BP 122/70 (BP Location: Left Arm, Patient Position: Sitting, Cuff Size: Normal)   Pulse 82   Ht 6' (1.829 m)   Wt 177 lb 8 oz (80.5 kg)   SpO2 97%   BMI 24.07 kg/m   Wt Readings from Last 3 Encounters:  12/08/23 177 lb 8 oz (80.5 kg)  12/01/22 175 lb (79.4 kg)  11/20/22 177 lb (80.3 kg)    Physical Exam Vitals and nursing note reviewed.  Constitutional:      General: He is not in acute distress.    Appearance: He is well-developed. He is not diaphoretic.     Comments: Well-appearing, comfortable, cooperative  HENT:     Head: Normocephalic and atraumatic.  Eyes:     General:        Right eye: No discharge.        Left eye: No discharge.     Conjunctiva/sclera: Conjunctivae normal.     Pupils: Pupils are equal, round, and reactive to light.  Neck:     Thyroid: No thyromegaly.     Vascular: No carotid bruit.  Cardiovascular:     Rate and Rhythm: Normal rate and regular rhythm.     Pulses: Normal pulses.     Heart sounds: Normal heart sounds. No murmur heard. Pulmonary:     Effort: Pulmonary effort is normal. No respiratory distress.     Breath sounds: Normal breath sounds. No wheezing or rales.  Abdominal:     General: Bowel sounds are normal. There is no distension.     Palpations: Abdomen is soft. There is no mass.     Tenderness:  There is no abdominal tenderness.  Musculoskeletal:        General: No tenderness. Normal range of motion.     Cervical back: Normal range of motion and neck supple.     Right lower leg: No edema.     Left lower leg: No edema.     Comments: Upper / Lower  Extremities: - Normal muscle tone, strength bilateral upper extremities 5/5, lower extremities 5/5  Lymphadenopathy:     Cervical: No cervical adenopathy.  Skin:    General: Skin is warm and dry.     Findings: No erythema or rash.  Neurological:     Mental Status: He is alert and oriented to person, place, and time.     Comments: Distal sensation intact to light touch all extremities  Psychiatric:        Mood and Affect: Mood normal.        Behavior: Behavior normal.        Thought Content: Thought content normal.     Comments: Well groomed, good eye contact, normal speech and thoughts     Results for orders placed or performed in visit on 12/01/23  T4, free   Collection Time: 12/01/23  8:18 AM  Result Value Ref Range   Free T4 1.1 0.8 - 1.8 ng/dL  TSH   Collection Time: 12/01/23  8:18 AM  Result Value Ref Range   TSH 4.96 (H) 0.40 - 4.50 mIU/L  PSA   Collection Time: 12/01/23  8:18 AM  Result Value Ref Range   PSA 0.50 < OR = 4.00 ng/mL  Lipid panel   Collection Time: 12/01/23  8:18 AM  Result Value Ref Range   Cholesterol 152 <200 mg/dL   HDL 62 > OR = 40 mg/dL   Triglycerides 47 <829 mg/dL   LDL Cholesterol (Calc) 77 mg/dL (calc)   Total CHOL/HDL Ratio 2.5 <5.0 (calc)   Non-HDL Cholesterol (Calc) 90 <562 mg/dL (calc)  Hemoglobin Z3Y   Collection Time: 12/01/23  8:18 AM  Result Value Ref Range   Hgb A1c MFr Bld 5.7 (H) <5.7 %   Mean Plasma Glucose 117 mg/dL   eAG (mmol/L) 6.5 mmol/L  CBC with Differential/Platelet   Collection Time: 12/01/23  8:18 AM  Result Value Ref Range   WBC 6.5 3.8 - 10.8 Thousand/uL   RBC 5.16 4.20 - 5.80 Million/uL   Hemoglobin 15.4 13.2 - 17.1 g/dL   HCT 86.5 78.4 - 69.6 %   MCV 88.8 80.0 - 100.0 fL   MCH 29.8 27.0 - 33.0 pg   MCHC 33.6 32.0 - 36.0 g/dL   RDW 29.5 28.4 - 13.2 %   Platelets 262 140 - 400 Thousand/uL   MPV 9.8 7.5 - 12.5 fL   Neutro Abs 3,978 1,500 - 7,800 cells/uL   Absolute Lymphocytes 1,469 850 - 3,900 cells/uL    Absolute Monocytes 813 200 - 950 cells/uL   Eosinophils Absolute 221 15 - 500 cells/uL   Basophils Absolute 20 0 - 200 cells/uL   Neutrophils Relative % 61.2 %   Total Lymphocyte 22.6 %   Monocytes Relative 12.5 %   Eosinophils Relative 3.4 %   Basophils Relative 0.3 %  COMPLETE METABOLIC PANEL WITH GFR   Collection Time: 12/01/23  8:18 AM  Result Value Ref Range   Glucose, Bld 90 65 - 99 mg/dL   BUN 14 7 - 25 mg/dL   Creat 4.40 1.02 - 7.25 mg/dL   BUN/Creatinine Ratio SEE NOTE: 6 - 22 (  calc)   Sodium 139 135 - 146 mmol/L   Potassium 4.4 3.5 - 5.3 mmol/L   Chloride 103 98 - 110 mmol/L   CO2 29 20 - 32 mmol/L   Calcium 9.2 8.6 - 10.3 mg/dL   Total Protein 6.8 6.1 - 8.1 g/dL   Albumin 4.1 3.6 - 5.1 g/dL   Globulin 2.7 1.9 - 3.7 g/dL (calc)   AG Ratio 1.5 1.0 - 2.5 (calc)   Total Bilirubin 0.6 0.2 - 1.2 mg/dL   Alkaline phosphatase (APISO) 54 35 - 144 U/L   AST 16 10 - 35 U/L   ALT 13 9 - 46 U/L      Assessment & Plan:   Problem List Items Addressed This Visit   None Visit Diagnoses       Annual physical exam    -  Primary     Elevated TSH         Elevated hemoglobin A1c            Updated Health Maintenance information Reviewed recent lab results with patient Encouraged improvement to lifestyle with diet and exercise Goal of weight loss  Offered preventative services including vaccines Prevnar-20 he declines today  Future Colonoscopy 2027.  Elevated TSH Borderline TSH, but normal T4 No concerns. Asymptomatic  Elevated A1c 5.7 result, no prior elevation had 5.6 before Will monitor and advice on goal to limit carb starches sugars in diet for pre-diabetes prevention  MALT Followed by specialists currently Hematology, Gastroenterology.   No orders of the defined types were placed in this encounter.   No orders of the defined types were placed in this encounter.    Follow up plan: Return for 1 year fasting lab > 1 week later Annual  Physical.  Domingo Friend, DO [facility] 12/08/2023, 9:16 AM

## 2023-12-08 NOTE — Patient Instructions (Addendum)
 Thank you for coming to the office today.  Recent Labs    12/01/23 0818  HGBA1C 5.7*   Mild elevated BP for you 120s  Lab results look good overall.  Prevnar-20 pneumonia  Colonoscopy in 2027  DUE for FASTING BLOOD WORK (no food or drink after midnight before the lab appointment, only water or coffee without cream/sugar on the morning of)  SCHEDULE "Lab Only" visit in the morning at the clinic for lab draw in 1 YEAR  - Make sure Lab Only appointment is at about 1 week before your next appointment, so that results will be available  For Lab Results, once available within 2-3 days of blood draw, you can can log in to MyChart online to view your results and a brief explanation. Also, we can discuss results at next follow-up visit.   Please schedule a Follow-up Appointment to: Return for 1 year fasting lab > 1 week later Annual Physical.  If you have any other questions or concerns, please feel free to call the office or send a message through MyChart. You may also schedule an earlier appointment if necessary.  Additionally, you may be receiving a survey about your experience at our office within a few days to 1 week by e-mail or mail. We value your feedback.  Domingo Friend, DO [facility], [place]

## 2024-01-18 ENCOUNTER — Other Ambulatory Visit: Payer: Self-pay | Admitting: *Deleted

## 2024-01-18 DIAGNOSIS — C884 Extranodal marginal zone b-cell lymphoma of mucosa-associated lymphoid tissue (malt-lymphoma) not having achieved remission: Secondary | ICD-10-CM

## 2024-01-19 ENCOUNTER — Inpatient Hospital Stay (HOSPITAL_BASED_OUTPATIENT_CLINIC_OR_DEPARTMENT_OTHER): Admitting: Internal Medicine

## 2024-01-19 ENCOUNTER — Inpatient Hospital Stay: Attending: Internal Medicine

## 2024-01-19 ENCOUNTER — Encounter: Payer: Self-pay | Admitting: Internal Medicine

## 2024-01-19 VITALS — BP 120/82 | HR 62 | Temp 97.0°F | Resp 12 | Ht 72.0 in | Wt 178.2 lb

## 2024-01-19 DIAGNOSIS — C884 Extranodal marginal zone b-cell lymphoma of mucosa-associated lymphoid tissue (malt-lymphoma) not having achieved remission: Secondary | ICD-10-CM

## 2024-01-19 DIAGNOSIS — Z9221 Personal history of antineoplastic chemotherapy: Secondary | ICD-10-CM | POA: Insufficient documentation

## 2024-01-19 DIAGNOSIS — Z8572 Personal history of non-Hodgkin lymphomas: Secondary | ICD-10-CM | POA: Diagnosis not present

## 2024-01-19 DIAGNOSIS — Z800 Family history of malignant neoplasm of digestive organs: Secondary | ICD-10-CM | POA: Insufficient documentation

## 2024-01-19 DIAGNOSIS — H2012 Chronic iridocyclitis, left eye: Secondary | ICD-10-CM | POA: Insufficient documentation

## 2024-01-19 DIAGNOSIS — Z85038 Personal history of other malignant neoplasm of large intestine: Secondary | ICD-10-CM | POA: Insufficient documentation

## 2024-01-19 DIAGNOSIS — Z87891 Personal history of nicotine dependence: Secondary | ICD-10-CM | POA: Insufficient documentation

## 2024-01-19 LAB — CBC WITH DIFFERENTIAL (CANCER CENTER ONLY)
Abs Immature Granulocytes: 0.02 10*3/uL (ref 0.00–0.07)
Basophils Absolute: 0 10*3/uL (ref 0.0–0.1)
Basophils Relative: 0 %
Eosinophils Absolute: 0.2 10*3/uL (ref 0.0–0.5)
Eosinophils Relative: 3 %
HCT: 42.9 % (ref 39.0–52.0)
Hemoglobin: 14.9 g/dL (ref 13.0–17.0)
Immature Granulocytes: 0 %
Lymphocytes Relative: 22 %
Lymphs Abs: 1.7 10*3/uL (ref 0.7–4.0)
MCH: 29.7 pg (ref 26.0–34.0)
MCHC: 34.7 g/dL (ref 30.0–36.0)
MCV: 85.6 fL (ref 80.0–100.0)
Monocytes Absolute: 0.8 10*3/uL (ref 0.1–1.0)
Monocytes Relative: 10 %
Neutro Abs: 5 10*3/uL (ref 1.7–7.7)
Neutrophils Relative %: 65 %
Platelet Count: 262 10*3/uL (ref 150–400)
RBC: 5.01 MIL/uL (ref 4.22–5.81)
RDW: 13.4 % (ref 11.5–15.5)
WBC Count: 7.8 10*3/uL (ref 4.0–10.5)
nRBC: 0 % (ref 0.0–0.2)

## 2024-01-19 LAB — CMP (CANCER CENTER ONLY)
ALT: 15 U/L (ref 0–44)
AST: 20 U/L (ref 15–41)
Albumin: 3.9 g/dL (ref 3.5–5.0)
Alkaline Phosphatase: 53 U/L (ref 38–126)
Anion gap: 7 (ref 5–15)
BUN: 17 mg/dL (ref 8–23)
CO2: 25 mmol/L (ref 22–32)
Calcium: 9 mg/dL (ref 8.9–10.3)
Chloride: 104 mmol/L (ref 98–111)
Creatinine: 1 mg/dL (ref 0.61–1.24)
GFR, Estimated: >60 mL/min (ref >60)
Glucose, Bld: 94 mg/dL (ref 70–99)
Potassium: 4.2 mmol/L (ref 3.5–5.1)
Sodium: 136 mmol/L (ref 135–145)
Total Bilirubin: 0.9 mg/dL (ref 0.0–1.2)
Total Protein: 7.3 g/dL (ref 6.5–8.1)

## 2024-01-19 NOTE — Progress Notes (Signed)
 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 On Mxt [summer 2017]  # Lung nodule [incidental on CT]; last Nov 2014- 3mm RML- STABLE [no further fu]  # ? 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  Ambulating independently.  A very pleasant 63 year old male patient with above history left orbital/ MALT lymphoma status post definitive radiation is here for follow-up.  Continues to complain of poor left eye vision.  He continues to follow with [facility].  Patient denies any worsening eye swelling.  He continues to have chronic discomfort from his uveitis.   Review of Systems  Constitutional:  Negative for chills, diaphoresis, fever, malaise/fatigue and weight loss.  HENT:  Negative for nosebleeds and sore throat.   Eyes:  Negative for double vision.       Left eye chronic poor vision/runny eyes.  Respiratory:  Negative  for cough, hemoptysis, sputum production, shortness of breath and wheezing.   Cardiovascular:  Negative for chest pain, palpitations, orthopnea and leg swelling.  Gastrointestinal:  Negative for abdominal pain, blood in stool, constipation, diarrhea, heartburn, melena, nausea and vomiting.  Genitourinary:  Negative for dysuria, frequency and urgency.  Musculoskeletal:  Negative for back pain and joint pain.  Skin: Negative.  Negative for itching and rash.  Neurological:  Negative for dizziness, tingling, focal weakness, weakness and headaches.  Endo/Heme/Allergies:  Does not bruise/bleed easily.  Psychiatric/Behavioral:  Negative for depression. The patient is not nervous/anxious and does not have insomnia.    PAST MEDICAL HISTORY :  Past Medical History:  Diagnosis Date   Abnormal heart rhythm    Anginal pain (HCC)    Colon cancer (HCC)    sigmoid   Dysrhythmia    Headache    Hemorrhoid    History of chemotherapy    FOLFOX   Lung nodule    RIGHT/ NO PROGRESSION   Mitral valve prolapse    Pt reports Dr. Perla told him NO MVP   Rheumatoid arthritis (HCC)     PAST SURGICAL HISTORY :   Past Surgical History:  Procedure Laterality Date   AQUEOUS SHUNT Left 10/12/2016   Procedure: AQUEOUS SHUNT with scleral patch graft  Left;  Surgeon: Donzell Arlyce Budd, MD;  [facility];  Service: Ophthalmology;  Laterality: Left;  tube shunt   BIOPSY Left 10/12/2016   Procedure: BIOPSY (fresh & permanent specimens sent from same location)- conjunctiva left eye;  Surgeon: Donzell Arlyce Budd, MD;  Location: [facility];  Service: Ophthalmology;  Laterality: Left;   CATARACT EXTRACTION W/PHACO Left 03/01/2018   Procedure: CATARACT EXTRACTION PHACO AND INTRAOCULAR LENS PLACEMENT (IOC);  Surgeon: Jaye Fallow, MD;  Location: [facility];  Service: Ophthalmology;  Laterality: Left;  US  01:49.2 AP% 18.6 CDE 20.32 Fluid Pack Lot # [id number] H   COLON SURGERY      COLONOSCOPY  2013, 05/16/2008   COLONOSCOPY N/A 07/29/2022   Procedure: COLONOSCOPY;  Surgeon: Toledo, Ladell POUR, MD;  Location: [facility];  Service: Gastroenterology;  Laterality: N/A;   ESOPHAGOGASTRODUODENOSCOPY  06/04/08   EYE SURGERY Left    TUMOR BIOPSY   PORT-A-CATH REMOVAL  2010   PORTACATH PLACEMENT  06/2008   XI ROBOTIC ASSISTED INGUINAL HERNIA REPAIR WITH MESH Left 05/11/2022   Procedure: XI ROBOTIC ASSISTED INGUINAL HERNIA REPAIR WITH MESH;  Surgeon: Rodolph Romano, MD;  Location: [facility];  Service: General;  Laterality: Left;    FAMILY HISTORY :   Family History  Problem Relation Age of Onset   Arthritis Mother    Colon polyps Mother    Hypothyroidism Mother    Heart murmur Father    Valvular heart disease Father 76   Colon cancer Maternal Grandfather 59   Rheumatologic disease Maternal Aunt    Prostate cancer Neg Hx     SOCIAL HISTORY:   Social History   Tobacco Use   Smoking status: Former    Current packs/day: 0.00    Average packs/day: 1 pack/day for 24.0 years (24.0 ttl pk-yrs)    Types: Cigarettes    Start date: 04/15/2008    Quit date: 07/27/2008    Years since quitting: 15.4   Smokeless tobacco: Former  Building services engineer status: Never Used  Substance Use Topics   Alcohol use: Not Currently    Comment: occasional alcohol use-wine   Drug use: Not Currently    Types: Marijuana    Comment: last used 1989    ALLERGIES:  is allergic to latex, lumigan [bimatoprost], and thorazine [chlorpromazine].  MEDICATIONS:  Current Outpatient Medications  Medication Sig Dispense Refill   Ascorbic Acid (VITAMIN C) 1000 MG tablet Take 1,000 mg by mouth daily.     aspirin EC 81 MG tablet Take 81 mg by mouth daily.     Calcium Carb-Cholecalciferol (CALCIUM 1000 + D PO) Take by mouth.     carboxymethylcellulose (REFRESH PLUS) 0.5 % SOLN Apply to eye.     Cholecalciferol (VITAMIN D) 2000 units CAPS Take 2,000 Units by mouth daily.     Difluprednate 0.05  % EMUL Place 1 drop into the left eye daily.     Multiple Vitamins-Minerals (DAILY MULTI) TABS Take 1 tablet by mouth daily.     multivitamin-lutein (OCUVITE-LUTEIN) CAPS capsule Take 1 capsule by mouth daily.     polyethylene glycol powder (GLYCOLAX/MIRALAX) 17 GM/SCOOP powder Take 1 Container by mouth as needed for mild constipation.     timolol (BETIMOL) 0.25 % ophthalmic solution 1-2 drops 2 (two) times daily.     No current facility-administered medications for this visit.    PHYSICAL EXAMINATION: ECOG PERFORMANCE STATUS: 0 - Asymptomatic  BP 120/82 (BP Location: Right Arm, Patient Position: Sitting, Cuff Size: Normal)   Pulse 62   Temp (!) 97 F (36.1 C) (Tympanic)   Resp 12   Ht 6' (1.829 m)   Wt 178 lb 3.2 oz (80.8 kg)   SpO2 98%   BMI 24.17 kg/m   Filed Weights   01/19/24 9041  Weight: 178 lb 3.2 oz (80.8 kg)   Physical Exam Constitutional:      Comments: Patient is alone.  HENT:     Head: Normocephalic and atraumatic.     Mouth/Throat:     Pharynx: No oropharyngeal exudate.   Eyes:     Pupils: Pupils are equal, round, and reactive to light.    Cardiovascular:     Rate and Rhythm: Normal rate and regular rhythm.  Pulmonary:     Effort: No respiratory distress.     Breath sounds: No wheezing.  Abdominal:     General: Bowel sounds are normal. There is no distension.     Palpations: Abdomen is soft. There is no mass.     Tenderness: There is no abdominal tenderness. There is no guarding or rebound.   Musculoskeletal:        General: No tenderness. Normal range of motion.     Cervical back: Normal range of motion and neck supple.   Skin:    General: Skin is warm.   Neurological:     Mental Status: He is alert and oriented to person, place, and time.   Psychiatric:        Mood and Affect: Affect normal.      LABORATORY DATA:  I have reviewed the data as listed    Component Value Date/Time   NA 136 01/19/2024 1002   NA 142 05/20/2020 1546   NA  138 05/03/2014 1041   K 4.2 01/19/2024 1002   K 3.8 05/03/2014 1041   CL 104 01/19/2024 1002   CL 103 05/03/2014 1041   CO2 25 01/19/2024 1002   CO2 30 05/03/2014 1041   GLUCOSE 94 01/19/2024 1002   GLUCOSE 102 (H) 05/03/2014 1041   BUN 17 01/19/2024 1002   BUN 16 05/20/2020 1546   BUN 15 05/03/2014 1041   CREATININE 1.00 01/19/2024 1002   CREATININE 1.07 12/01/2023 0818   CALCIUM 9.0 01/19/2024 1002   CALCIUM 9.2 05/03/2014 1041   PROT 7.3 01/19/2024 1002   PROT 7.5 05/03/2014 1041   ALBUMIN 3.9 01/19/2024 1002   ALBUMIN 3.6 05/03/2014 1041   AST 20 01/19/2024 1002   ALT 15 01/19/2024 1002   ALT 20 05/03/2014 1041   ALKPHOS 53 01/19/2024 1002   ALKPHOS 52 05/03/2014 1041   BILITOT 0.9 01/19/2024 1002   GFRNONAA >60 01/19/2024 1002   GFRNONAA 82 09/17/2020 0814   GFRAA 95 09/17/2020 0814    No results found for: SPEP, UPEP  Lab Results  Component Value Date   WBC 7.8 01/19/2024   NEUTROABS 5.0 01/19/2024   HGB 14.9 01/19/2024   HCT 42.9 01/19/2024   MCV 85.6 01/19/2024   PLT 262 01/19/2024      Chemistry      Component Value Date/Time   NA 136 01/19/2024 1002   NA 142 05/20/2020 1546   NA 138 05/03/2014 1041   K 4.2 01/19/2024 1002   K 3.8 05/03/2014 1041   CL 104 01/19/2024 1002   CL 103 05/03/2014 1041   CO2 25 01/19/2024 1002   CO2 30 05/03/2014 1041   BUN 17 01/19/2024 1002   BUN 16 05/20/2020 1546   BUN 15 05/03/2014 1041   CREATININE 1.00 01/19/2024 1002   CREATININE 1.07 12/01/2023 0818      Component Value Date/Time   CALCIUM 9.0 01/19/2024 1002   CALCIUM 9.2 05/03/2014 1041   ALKPHOS 53 01/19/2024 1002   ALKPHOS 52 05/03/2014 1041   AST 20  01/19/2024 1002   ALT 15 01/19/2024 1002   ALT 20 05/03/2014 1041   BILITOT 0.9 01/19/2024 1002      ASSESSMENT & PLAN:  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 humira. S/p RT  [jan 15th 2019]- stable.   # Clinically no evidence of recurrence.;  Continue surveillance without imaging.- stable.   # Left eye discomfort/tearing-elated to allergies/underlying uveitis/scleritis.  stable/ [facility]- / Dr.Profilio; Dr.Jaffe- [facility]; JAN 2025]- stable.  #Stage III colon cancer-at 47 years;  NED- 2023- [facility]]- stable.   #Since patient is clinically stable I think is reasonable for the patient to follow-up with PCP/can follow-up with us  as needed.  Patient however wants to continue to follow up for now.   #Disposition:  # Follow up in 12 months-MD;labs-cbc/cmp/cea- Dr.B  Cc; Dr.Karamgeglos    Cindy JONELLE Joe, MD 01/19/2024 10:54 AM

## 2024-01-19 NOTE — Assessment & Plan Note (Addendum)
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 humira. S/p RT [jan 15th 2019]- stable.   # Clinically no evidence of recurrence.;  Continue surveillance without imaging.- stable.   # Left eye discomfort/tearing-elated to allergies/underlying uveitis/scleritis.  stable/ [facility]- / Dr.Profilio; Dr.Jaffe- [facility]; JAN 2025]- stable.  #Stage III colon cancer-at 47 years;  NED- 2023- [facility]]- stable.   #Since patient is clinically stable I think is reasonable for the patient to follow-up with PCP/can follow-up with us  as needed.  Patient tentatively however wants to continue to follow up for now.   #Disposition:  # Follow up in 12 months-MD;labs-cbc/cmp/cea- Dr.B  Cc; Dr.Karamgeglos

## 2024-01-19 NOTE — Progress Notes (Signed)
 No concerns today

## 2024-01-20 LAB — CEA: CEA: 2.1 ng/mL (ref 0.0–4.7)

## 2024-02-02 DIAGNOSIS — H209 Unspecified iridocyclitis: Secondary | ICD-10-CM | POA: Diagnosis not present

## 2024-02-02 DIAGNOSIS — H25813 Combined forms of age-related cataract, bilateral: Secondary | ICD-10-CM | POA: Diagnosis not present

## 2024-02-02 DIAGNOSIS — C884 Extranodal marginal zone b-cell lymphoma of mucosa-associated lymphoid tissue (malt-lymphoma) not having achieved remission: Secondary | ICD-10-CM | POA: Diagnosis not present

## 2024-02-02 DIAGNOSIS — H4042X4 Glaucoma secondary to eye inflammation, left eye, indeterminate stage: Secondary | ICD-10-CM | POA: Diagnosis not present

## 2024-02-02 DIAGNOSIS — H43823 Vitreomacular adhesion, bilateral: Secondary | ICD-10-CM | POA: Diagnosis not present

## 2024-02-02 DIAGNOSIS — H5213 Myopia, bilateral: Secondary | ICD-10-CM | POA: Diagnosis not present

## 2024-02-02 DIAGNOSIS — H15032 Posterior scleritis, left eye: Secondary | ICD-10-CM | POA: Diagnosis not present

## 2024-08-22 ENCOUNTER — Ambulatory Visit: Admitting: Dermatology

## 2024-08-22 ENCOUNTER — Ambulatory Visit: Payer: 59 | Admitting: Dermatology

## 2024-08-29 ENCOUNTER — Ambulatory Visit: Admitting: Dermatology

## 2024-08-29 ENCOUNTER — Encounter: Payer: Self-pay | Admitting: Dermatology

## 2024-08-29 DIAGNOSIS — L814 Other melanin hyperpigmentation: Secondary | ICD-10-CM

## 2024-08-29 DIAGNOSIS — Z808 Family history of malignant neoplasm of other organs or systems: Secondary | ICD-10-CM | POA: Diagnosis not present

## 2024-08-29 DIAGNOSIS — L821 Other seborrheic keratosis: Secondary | ICD-10-CM | POA: Diagnosis not present

## 2024-08-29 DIAGNOSIS — Z1283 Encounter for screening for malignant neoplasm of skin: Secondary | ICD-10-CM

## 2024-08-29 DIAGNOSIS — W908XXA Exposure to other nonionizing radiation, initial encounter: Secondary | ICD-10-CM

## 2024-08-29 DIAGNOSIS — L570 Actinic keratosis: Secondary | ICD-10-CM

## 2024-08-29 DIAGNOSIS — L820 Inflamed seborrheic keratosis: Secondary | ICD-10-CM

## 2024-08-29 DIAGNOSIS — L578 Other skin changes due to chronic exposure to nonionizing radiation: Secondary | ICD-10-CM | POA: Diagnosis not present

## 2024-08-29 DIAGNOSIS — D1801 Hemangioma of skin and subcutaneous tissue: Secondary | ICD-10-CM

## 2024-08-29 DIAGNOSIS — M6740 Ganglion, unspecified site: Secondary | ICD-10-CM

## 2024-08-29 DIAGNOSIS — D229 Melanocytic nevi, unspecified: Secondary | ICD-10-CM

## 2024-08-29 NOTE — Patient Instructions (Signed)

## 2024-12-08 ENCOUNTER — Other Ambulatory Visit

## 2024-12-19 ENCOUNTER — Encounter: Admitting: Family Medicine

## 2025-01-18 ENCOUNTER — Ambulatory Visit: Admitting: Internal Medicine

## 2025-01-18 ENCOUNTER — Other Ambulatory Visit

## 2025-08-30 ENCOUNTER — Ambulatory Visit: Admitting: Dermatology
